# Patient Record
Sex: Female | Born: 1985 | Race: White | Hispanic: No | Marital: Married | Smoking: Never smoker
Health system: Southern US, Community
[De-identification: ages and names within clinical notes are randomized; demographics above are authoritative.]

## PROBLEM LIST (undated history)

## (undated) ENCOUNTER — Inpatient Hospital Stay (HOSPITAL_COMMUNITY): Payer: Self-pay

## (undated) DIAGNOSIS — O021 Missed abortion: Secondary | ICD-10-CM

## (undated) HISTORY — PX: ABDOMINOPLASTY: SUR9

## (undated) HISTORY — PX: OTHER SURGICAL HISTORY: SHX169

---

## 2010-01-30 ENCOUNTER — Encounter: Payer: Self-pay | Admitting: Family Medicine

## 2010-01-30 ENCOUNTER — Ambulatory Visit: Payer: Self-pay | Admitting: Family Medicine

## 2010-01-30 DIAGNOSIS — J309 Allergic rhinitis, unspecified: Secondary | ICD-10-CM | POA: Insufficient documentation

## 2010-01-30 DIAGNOSIS — R824 Acetonuria: Secondary | ICD-10-CM | POA: Insufficient documentation

## 2010-01-30 DIAGNOSIS — O24019 Pre-existing diabetes mellitus, type 1, in pregnancy, unspecified trimester: Secondary | ICD-10-CM | POA: Insufficient documentation

## 2010-01-30 DIAGNOSIS — R7309 Other abnormal glucose: Secondary | ICD-10-CM | POA: Insufficient documentation

## 2010-01-31 ENCOUNTER — Encounter: Payer: Self-pay | Admitting: Family Medicine

## 2010-02-01 ENCOUNTER — Encounter: Payer: Self-pay | Admitting: Family Medicine

## 2010-02-02 ENCOUNTER — Ambulatory Visit: Payer: Self-pay | Admitting: Family Medicine

## 2010-02-04 ENCOUNTER — Encounter (INDEPENDENT_AMBULATORY_CARE_PROVIDER_SITE_OTHER): Payer: Self-pay

## 2010-02-05 LAB — CONVERTED CEMR LAB
AST: 11 units/L (ref 0–37)
Alkaline Phosphatase: 65 units/L (ref 39–117)
BUN: 11 mg/dL (ref 6–23)
Basophils Relative: 1 % (ref 0–1)
Eosinophils Absolute: 0 10*3/uL (ref 0.0–0.7)
Eosinophils Relative: 0 % (ref 0–5)
Glucose, Bld: 451 mg/dL — ABNORMAL HIGH (ref 70–99)
Hemoglobin: 14.1 g/dL (ref 12.0–15.0)
MCHC: 33.7 g/dL (ref 30.0–36.0)
MCV: 85.3 fL (ref 78.0–100.0)
Monocytes Absolute: 0.6 10*3/uL (ref 0.1–1.0)
Monocytes Relative: 7 % (ref 3–12)
RBC: 4.91 M/uL (ref 3.87–5.11)
Sodium: 135 meq/L (ref 135–145)
Total Bilirubin: 0.6 mg/dL (ref 0.3–1.2)
Vit D, 25-Hydroxy: 42 ng/mL (ref 30–89)

## 2010-02-12 ENCOUNTER — Telehealth: Payer: Self-pay | Admitting: Family Medicine

## 2010-02-17 HISTORY — PX: WISDOM TOOTH EXTRACTION: SHX21

## 2010-02-25 ENCOUNTER — Encounter: Payer: Self-pay | Admitting: Family Medicine

## 2010-02-27 ENCOUNTER — Encounter
Admission: RE | Admit: 2010-02-27 | Discharge: 2010-03-19 | Payer: Self-pay | Source: Home / Self Care | Attending: Family Medicine | Admitting: Family Medicine

## 2010-03-21 LAB — CONVERTED CEMR LAB
Bilirubin Urine: NEGATIVE
Blood Glucose, Fingerstick: 408
Blood in Urine, dipstick: NEGATIVE
Glucose, Urine, Semiquant: 500
WBC Urine, dipstick: NEGATIVE
pH: 5

## 2010-03-21 NOTE — Progress Notes (Signed)
  Phone Note Call from Patient   Caller: Patient Reason for Call: Talk to Nurse Summary of Call: Patient called because she needs a copy of recent lab work for an dr. appointment within [facility]  She is also wondering if we received her antibody results.  You can call her at [phone]. Initial call taken by: Dorna Leitz,  February 12, 2010 1:57 PM  Follow-up for Phone Call        Pt. requested a copy of her current lab results. Pt's current lab results were faxex to the pt. at [phone]. / rwt Follow-up by: Levonne Spiller EMT-P,  February 12, 2010 2:15 PM

## 2010-03-21 NOTE — Letter (Signed)
Summary: Health Report Card  Health Report Card   Imported By: Dorna Leitz 01/30/2010 16:45:20  _____________________________________________________________________  External Attachment:    Type:   Image     Comment:   External Document

## 2010-03-21 NOTE — Letter (Signed)
Summary: Handout Printed  Printed Handout:  - Insulin Injections, How & Where to Give, Adult

## 2010-03-21 NOTE — Assessment & Plan Note (Signed)
Summary: POSSIBLE SHE MIGHT HAVE DIABETES/EVM   Vital Signs:  Patient Profile:   25 Years Old Female CC:      DM check. Height:     67.5 inches Weight:      131 pounds BMI:     20.29 O2 Sat:      100 % O2 treatment:    Room Air Temp:     98.0 degrees F oral Pulse rate:   136 / minute Pulse rhythm:   regular Resp:     18 per minute BP sitting:   152 / 97  (right arm)  Pt. in pain?   no  Vitals Entered By: Levonne Spiller EMT-P (January 30, 2010 2:41 PM)              Is Patient Diabetic? No CBG Result 408  Does patient need assistance? Functional Status Self care Ambulation Normal Comments 2nd CBG Results= 398mg /dl post 10 units Regular Insulin, 3rd CBG Results= 401mg /dl, 4th CBG NFAOZHY=865 Post 10 units Regular Insulin. / rwt     Serial Vital Signs/Assessments:  Time      Position  BP       Pulse  Resp  Temp     By 435 P               134/90   90                    Clanford Johnson MD   Current Allergies: No known allergies  Lab Results    Ordered by:  DR. Maryln Manuel    Date tests performed: 01/30/2010    Performed by:  Levonne Spiller EMT-P Urinalysis:      Color:     Yellow    Appear:     Clear    Leuk:     Neg    Nitr:     Neg    Urobil:     0.2    Prot:     Neg    pH:     5.0    Blood:     Neg    Sp. Gr:     1.010    Ket:     80mg /dl    Bili:     Neg    Glu:     500     Hgb A1C:     >14.0   % History of Present Illness History from: patient Reason for visit: see chief complaint Chief Complaint: DM check. History of Present Illness: This patient came in to be evaluated for an elevated blood sugar that was found when she had a health screen done on 11/28.  She didn't get the results back for about 2 weeks and then she learned that her blood sugar was over 300 for the health screen.  She said that she had lost 30 lbs since January 2011.  She says that she has been thirsty and fatigued and having a dry mouth.  She says that she never suspected that she  could have diabetes.  She says that she does have a family history of diabetes.  Her maternal grandmother suffered from Type 1 DM.   Her paternal GF also had DM.  She has been having headaches during the day.  She had an eye exam 2 months ago that was OK.  No retinopathy was noted to the patient.   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   Her urination has increased substantially over the last several days.  She reports that she did have a flu vaccine this season.    REVIEW OF SYSTEMS Constitutional Symptoms       Complains of weight loss.     Denies fever, chills, night sweats, weight gain, and fatigue.  Eyes       Complains of contact lenses.      Denies change in vision, eye pain, eye discharge, glasses, and eye surgery. Ear/Nose/Throat/Mouth       Denies hearing loss/aids, change in hearing, ear pain, ear discharge, dizziness, frequent runny nose, frequent nose bleeds, sinus problems, sore throat, hoarseness, and tooth pain or bleeding.  Respiratory       Denies dry cough, productive cough, wheezing, shortness of breath, asthma, bronchitis, and emphysema/COPD.  Cardiovascular       Denies murmurs, chest pain, and tires easily with exhertion.    Gastrointestinal       Denies stomach pain, nausea/vomiting, diarrhea, constipation, blood in bowel movements, and indigestion. Genitourniary       Denies painful urination, kidney stones, and loss of urinary control. Neurological       Complains of headaches.      Denies paralysis, seizures, and fainting/blackouts. Musculoskeletal       Denies muscle pain, joint pain, joint stiffness, decreased range of motion, redness, swelling, muscle weakness, and gout.  Skin       Denies bruising, unusual mles/lumps or sores, and hair/skin or nail changes.  Psych       Denies mood changes, temper/anger issues, anxiety/stress, speech problems, depression, and sleep problems.  Past  History:  Past Medical History: Allergic rhinitis  Past Surgical History: Denies surgical history  Family History: Type 1 DM - Maternal GM Type 2 DM - Paternal GF Father - Hyperlipidemia Family History Hypertension Sibling - Healthy Sister  Social History: Occupation: Pt works at [employer] Married No Children Never Smoked Alcohol use-no Drug use-no Smoking Status:  never Drug Use:  no Physical Exam General appearance: well developed, thin female, no acute distress Head: normocephalic, atraumatic Eyes: conjunctivae and lids normal Pupils: equal, round, reactive to light Ears: normal, no lesions or deformities Nasal: mucosa pink, nonedematous, no septal deviation, turbinates normal Oral/Pharynx: dry mucus membranes, tongue normal, posterior pharynx without erythema or exudate Neck: neck supple,  trachea midline, no masses Thyroid: no nodules, masses, tenderness, or enlargement Chest/Lungs: no rales, wheezes, or rhonchi bilateral, breath sounds equal without effort Heart: regular rate and  rhythm, no murmur Abdomen: soft, non-tender without obvious organomegaly Extremities: normal extremities Neurological: grossly intact and non-focal Skin: no obvious rashes or lesions MSE: oriented to time, place, and person Assessment Problems:   New Problems: DIAB W/O COMP TYPE I [JUV] NOT STATED UNCNTRL (ICD-250.01) HYPERGLYCEMIA (ICD-790.29) KETONURIA (ICD-791.6) ALLERGIC RHINITIS (ICD-477.9)   Patient Education: Patient and/or caregiver instructed in the following: rest, fluids. The risks, benefits and possible side effects were clearly explained and discussed with the patient.  The patient verbalized clear understanding.  The patient was given instructions to return if symptoms don't improve, worsen or new changes develop.  If it is not during clinic hours and the patient cannot get back to this clinic then the patient was told to seek medical care at an  available urgent care or emergency department.  The patient verbalized understanding.   Demonstrates willingness to comply.  Plan New Medications/Changes: KETOSTIX  STRP (ACETONE (URINE) TEST) use as directed to test  urine for ketones when sick or blood sugar greater than 250.  #100 x 3, 01/30/2010, Clanford Johnson MD [facility]UCAGON EMERGENCY 1 MG KIT (GLUCAGON (RDNA)) use as directed for severe hypoglycemia  #1 x prn, 01/30/2010, Clanford Johnson MD KLOR-CON M20 20 MEQ CR-TABS (POTASSIUM CHLORIDE CRYS CR) take 1 tab by mouth two times a day until completed  #20 x 0, 01/30/2010, Clanford Johnson MD [facility] DELICA LANCETS  MISC (LANCETS) use as directed  #150 x 3, 01/30/2010, Clanford Johnson MD ONETOUCH ULTRA BLUE  STRP (GLUCOSE BLOOD) use to test blood glucose 4 times per day and as needed - Pt is on INSULIN  #150 x 3, 01/30/2010, Clanford Johnson MD RELION INSULIN SYRINGE 31G X 5/16" 0.3 ML MISC (INSULIN SYRINGE-NEEDLE U-100) use as directed for insulin injection  #100 x 3, 01/30/2010, Clanford Johnson MD NOVOLIN R 100 UNIT/ML SOLN (INSULIN REGULAR HUMAN) use as directed  #1 vial x 3, 01/30/2010, Clanford Johnson MD NOVOLIN N 100 UNIT/ML SUSP (INSULIN ISOPHANE HUMAN) use as directed  #1 vial x 3, 01/30/2010, Clanford Johnson MD  New Orders: Fingerstick [[id number]] Fingerstick [[id number]] Planning Comments:   Ordered additional labs including:  CBC, CMP, TSH, Mg, Phos, GAD-65, C-peptide   Follow Up: Follow up in 1-2 days if no improvement Follow Up: tomorrow by phone and OV in 2 days  The patient and/or caregiver has been counseled thoroughly with regard to medications prescribed including dosage, schedule, interactions, rationale for use, and possible side effects and they verbalize understanding.  Diagnoses and expected course of recovery discussed and will return if not improved as expected or if the condition worsens. Patient and/or caregiver verbalized understanding.  Prescriptions: KETOSTIX   STRP (ACETONE (URINE) TEST) use as directed to test urine for ketones when sick or blood sugar greater than 250.  #100 x 3   Entered and Authorized by:   Standley Dakins MD   Signed by:   Standley Dakins MD on 01/30/2010   Method used:   Electronically to        [facility] [address] (retail)       [address]       [place], [place]  [address]       Ph: [phone]       Fax: [phone]   RxID:   [id number] GLUCAGON EMERGENCY 1 MG KIT (GLUCAGON (RDNA)) use as directed for severe hypoglycemia  #1 x prn   Entered and Authorized by:   Standley Dakins MD   Signed by:   Standley Dakins MD on 01/30/2010   Method used:   Electronically to        [facility] [address] (retail)       [address], [address]       [place], [place]  [address]       Ph: [phone]       Fax: [phone]   RxID:   [id number] KLOR-CON M20 20 MEQ CR-TABS (POTASSIUM CHLORIDE CRYS CR) take 1 tab by mouth two times a day until completed  #20 x 0   Entered and Authorized by:   Standley Dakins MD   Signed by:   Standley Dakins MD on 01/30/2010   Method used:   Electronically to        [facility] [address] (retail)       [address], [address]  [place], [place]  [address]       Ph: [phone]       Fax: [phone]   RxID:   [id number] Dola Argyle LANCETS  MISC (LANCETS) use as directed  #150 x 3   Entered and Authorized by:   Standley Dakins MD   Signed by:   Standley Dakins MD on 01/30/2010   Method used:   Electronically to        [facility] [address] (retail)       [address], [address]       [place], [place]  [address]       Ph: [phone]       Fax: [phone]   RxID:   [id number] ONETOUCH ULTRA BLUE  STRP (GLUCOSE BLOOD) use to test blood glucose 4 times per day and as needed - Pt is on INSULIN  #150 x 3   Entered and  Authorized by:   Standley Dakins MD   Signed by:   Standley Dakins MD on 01/30/2010   Method used:   Electronically to        [facility] [address] (retail)       [address], [address]       [place], [place]  [address]       Ph: [phone]       Fax: [phone]   RxID:   [id number] RELION INSULIN SYRINGE 31G X 5/16" 0.3 ML MISC (INSULIN SYRINGE-NEEDLE U-100) use as directed for insulin injection  #100 x 3   Entered and Authorized by:   Standley Dakins MD   Signed by:   Standley Dakins MD on 01/30/2010   Method used:   Electronically to        [facility] [address] (retail)       [address], [address]       [place], [place]  [address]       Ph: [phone]       Fax: [phone]   RxID:   [id number] NOVOLIN R 100 UNIT/ML SOLN (INSULIN REGULAR HUMAN) use as directed  #1 vial x 3   Entered and Authorized by:   Standley Dakins MD   Signed by:   Standley Dakins MD on 01/30/2010   Method used:   Electronically to        [facility] [address] (retail)       [address], [address]       [place], [place]  [address]       Ph: [phone]       Fax: [phone]   RxID:   [id number] NOVOLIN N 100 UNIT/ML SUSP (INSULIN ISOPHANE HUMAN) use as directed  #1 vial x 3   Entered and Authorized by:   Standley Dakins MD   Signed by:   Standley Dakins MD on 01/30/2010   Method used:   Electronically to        [facility] [address] (retail)       [address], [address]       [place], [place]  [address]       Ph: [phone]       Fax: [phone]  RxID:   [id number]   Patient Instructions: 1)  Please check your blood sugar 4 times per day.  Fasting, before breakfast, lunch and supper. 2)  Take 5 units of Novolin R (clear) about 30 minutes before breakfast, lunch and supper.  3)  Take 12 units of Novolin N (cloudy) before bed, no  later than 10 PM. 4)  Take your potassium tablets and drink plenty of clear fluids. 5)  Your blood sugars are going to be high for awhile.   6)  If you feel shaky or sweaty or confused, check your blood sugar immediately.  If your blood sugar is 60 or below, drink some juice or eat something sweet 15 grams of carbohydrate to bring up your blood sugar.   7)  The patient was informed that there is no on-call provider or services available at this clinic during off-hours (when the clinic is closed).  If the patient developed a problem or concern that required immediate attention, the patient was advised to go the the nearest available urgent care or emergency department for medical care.  The patient verbalized understanding.    8)  Check your blood sugars regularly. If your readings are usually above 300: or below 70 you should contact our office. 9)  It is important that your Diabetic A1c level is checked every 3 months. 10)  See your eye doctor yearly to check for diabetic eye damage. 11)  Check your feet each night for sore areas, calluses or signs of infection. 12)  Please call us in the morning with your blood sugars.  Please come back with your husband in 2 days.  Write your blood sugars down on the log and bring them with you when you come to the clinic.  13)  We will probably be adjusting your doses of insulin over the next several weeks to get your diabetes under better control.   Medication Administration  Injection # 1:    Medication: Insulin 10 units    Diagnosis: HYPERGLYCEMIA (ICD-790.29)    Route: SQ    Site: abdomen    Exp Date: 02/17/2012    Lot #: [id number]    Mfr: LILLY    Patient tolerated injection without complications    Given by: Levonne Spiller EMT-P (January 30, 2010 4:01 PM)  Injection # 2:    Medication: Insulin 10 units    Diagnosis: HYPERGLYCEMIA (ICD-790.29)    Route: SQ    Site: Abdomen    Exp Date: 02/17/2012    Lot #: [id number]    Patient tolerated  injection without complications    Given by: Levonne Spiller EMT-P (January 30, 2010 4:36 PM)  Orders Added: 1)  Fingerstick [[id number]] 2)  Fingerstick [[id number]]  Lab Results    Ordered by:  DR. Maryln Manuel    Date tests performed: 01/30/2010    Performed by:  Levonne Spiller EMT-P Urinalysis:      Color:     Yellow    Appear:     Clear    Leuk:     Neg    Nitr:     Neg    Urobil:     0.2    Prot:     Neg    pH:     5.0    Blood:     Neg    Sp. Gr:     1.010    Ket:     80mg /dl    Bili:     Neg  Glu:     500     Hgb A1C:     >14.0   %    I spent more than 2 hours with the patient today giving her critical diabetes education and instructions regarding my thoughts about latent onset type 1 DM.  We instructed her on insulin administration, glucose monitoring, recognizing and treating hypoglycemia, we started discussing sick day rules, I gave the patient written information to read about diabetes and the care of diabetes.  I arranged for the patient to be seen by the diabetes educator at [facility] to begin intensive diabetes education and to see the dietician for further education.  I asked her to call us tomorrow morning with her blood sugars and to check the blood glucose at least 4 times per day.  I started her on a basal bolus insulin program today.  I wanted to use insulin analogs but the patient says that she cannot afford to take the insulin analogs. Therefore, we started her therapy with Novolin R and Novolin N.  I discussed the dangers and limitations of these insulins with the patient today.  The patient verbalized clear understanding.  I gave the patient a new blood glucose meter and testing strips.  The patient was able to demonstrate testing blood glucose and giving insulin today before going home.  I encouraged the patient to be sure to drink plenty of clear sugar fluids today.   I asked the patient to avoid drinking highly concentrated sugary drinks except to treat a low blood  glucose.  The patient verbalized clear understanding.

## 2010-03-21 NOTE — Letter (Signed)
Summary: Phsician Order for Nutrition and Diabetes   Phsician Order for Nutrition and Diabetes   Imported By: Dorna Leitz 01/31/2010 14:36:54  _____________________________________________________________________  External Attachment:    Type:   Image     Comment:   External Document

## 2010-03-21 NOTE — Letter (Signed)
Summary: Physician Order for Nutrition and Diabetes fax  Physician Order for Nutrition and Diabetes fax   Imported By: Dorna Leitz 01/31/2010 14:41:03  _____________________________________________________________________  External Attachment:    Type:   Image     Comment:   External Document

## 2010-03-21 NOTE — Miscellaneous (Signed)
Summary: Endocrinologist Appt.  Clinical Lists Changes  Pt. was contacted and notified of her Endocrinologist appt. with Dr. Tedd Sias at [facility]. Appt time is 2:30 on 02-05-2010.   Appended Document: Endocrinologist Appt. Pt's office notes and labs were faxed to Dr. Pricilla Handler office. [facility]

## 2010-03-21 NOTE — Letter (Signed)
Summary: BLOOD SUGAR LOG  BLOOD SUGAR LOG   Imported By: Rosine Beat 02/02/2010 17:27:59  _____________________________________________________________________  External Attachment:    Type:   Image     Comment:   External Document

## 2010-03-21 NOTE — Letter (Signed)
Summary: Handout Printed  Printed Handout:  - Blood Sugar, Blood Glucose

## 2010-03-21 NOTE — Letter (Signed)
Summary: Handout Printed  Printed Handout:  - Diabetes Mellitus 

## 2010-03-21 NOTE — Letter (Signed)
Summary: Revised Health Report Card  Revised Health Report Card   Imported By: Dorna Leitz 01/31/2010 16:15:21  _____________________________________________________________________  External Attachment:    Type:   Image     Comment:   External Document

## 2010-03-21 NOTE — Letter (Signed)
Summary: Handout Printed  Printed Handout:  - Diabetes, FAQ's

## 2010-03-21 NOTE — Letter (Signed)
Summary: Blood Sugar log  Blood Sugar log   Imported By: Dorna Leitz 01/31/2010 13:04:17  _____________________________________________________________________  External Attachment:    Type:   Image     Comment:   External Document

## 2010-03-21 NOTE — Letter (Signed)
Summary: Blood Sugar Log  Blood Sugar Log   Imported By: Dorna Leitz 02/01/2010 15:54:07  _____________________________________________________________________  External Attachment:    Type:   Image     Comment:   External Document

## 2010-03-21 NOTE — Letter (Signed)
Summary: Handout Printed  Printed Handout:  - Diabetes - Seven Principles of Caring for Your Diabetes for Life 

## 2010-03-21 NOTE — Assessment & Plan Note (Signed)
Summary: follow up visit from Weds/jbb   Vital Signs:  Patient profile:   25 year old female Height:      67.5 inches Weight:      132 pounds BMI:     20.44 Temp:     98 degrees F oral Pulse rate:   80 / minute Pulse rhythm:   regular Resp:     18 per minute BP sitting:   130 / 80  (left arm) Cuff size:   regular  Vitals Entered By: Providence Crosby LPN (February 02, 2010 11:07 AM)  Visit Type:  Followup diabetes   History of Present Illness: Followup diabetes - Pt is here to follow up for the recent diagnosis of T1 DM.  We continued intensive diabetes education in the office today.  The patient is doing very well.  She is having no hypoglycemia and her blood glucose readings have been reviewed.  Her numbers have been 120-180.  She had one BG that was 220 after a party where she had some potato salad and cole slaw.  We discussed some dietary things today and they brought in the glucagon emergency kit.  I demonstrated the use of it to the patient's husband who came in today.  I tried to answer all of their questions and once again we discussed hypoglycemia and treatment of it and recognition of it in the office today.  I requested that the patient get a medic Alert bracelet and wear it.   Also, I discussed that the patient should have a fast acting carb on the dashboard of the car at all times when driving.  She said that she would make sure to go ahead and get one.     Allergies: No Known Drug Allergies  Past History:  Past Medical History: Last updated: 01/30/2010 Allergic rhinitis  Past Surgical History: Last updated: 01/30/2010 Denies surgical history  Family History: Last updated: 01/30/2010 Type 1 DM - Maternal GM Type 2 DM - Paternal GF Father - Hyperlipidemia Family History Hypertension Sibling - Healthy Sister  Social History: Last updated: 01/30/2010 Occupation: Pt works at [employer] Married No Children Never Smoked Alcohol use-no Drug  use-no  Risk Factors: Smoking Status: never (01/30/2010)  Family History: Reviewed history from 01/30/2010 and no changes required. Type 1 DM - Maternal GM Type 2 DM - Paternal GF Father - Hyperlipidemia Family History Hypertension Sibling - Healthy Sister  Social History: Reviewed history from 01/30/2010 and no changes required. Occupation: Pt works at [employer] Married No Children Never Smoked Alcohol use-no Drug use-no  Review of Systems General:  Denies chills, fatigue, fever, loss of appetite, malaise, sleep disorder, sweats, weakness, and weight loss. Eyes:  Denies blurring, discharge, double vision, eye irritation, eye pain, halos, itching, light sensitivity, red eye, vision loss-1 eye, and vision loss-both eyes. ENT:  Denies decreased hearing, difficulty swallowing, ear discharge, earache, hoarseness, nasal congestion, nosebleeds, postnasal drainage, ringing in ears, sinus pressure, and sore throat. CV:  Denies bluish discoloration of lips or nails, chest pain or discomfort, difficulty breathing at night, difficulty breathing while lying down, fainting, fatigue, leg cramps with exertion, lightheadness, near fainting, palpitations, shortness of breath with exertion, swelling of feet, swelling of hands, and weight gain. Resp:  Denies chest discomfort, chest pain with inspiration, cough, coughing up blood, excessive snoring, hypersomnolence, morning headaches, pleuritic, shortness of breath, sputum productive, and wheezing. GI:  Denies abdominal pain, bloody stools, change in bowel habits, constipation, dark tarry stools, diarrhea, excessive appetite,  gas, hemorrhoids, indigestion, loss of appetite, nausea, vomiting, vomiting blood, and yellowish skin color. MS:  Denies joint pain, joint redness, joint swelling, loss of strength, low back pain, mid back pain, muscle aches, muscle , cramps, muscle weakness, stiffness, and thoracic pain. Neuro:  Denies brief  paralysis, difficulty with concentration, disturbances in coordination, falling down, headaches, inability to speak, memory loss, numbness, poor balance, seizures, sensation of room spinning, tingling, tremors, visual disturbances, and weakness. Psych:  Denies alternate hallucination ( auditory/visual), anxiety, depression, easily angered, easily tearful, irritability, mental problems, panic attacks, sense of great danger, suicidal thoughts/plans, thoughts of violence, unusual visions or sounds, and thoughts /plans of harming others.  Physical Exam  General:  well developed, thin female, no acute distress Head:  Normocephalic and atraumatic without obvious abnormalities. No apparent alopecia or balding. Eyes:  No corneal or conjunctival inflammation noted. EOMI. Perrla. Funduscopic exam benign, without hemorrhages, exudates or papilledema. Vision grossly normal. Ears:  External ear exam shows no significant lesions or deformities.  Otoscopic examination reveals clear canals, tympanic membranes are intact bilaterally without bulging, retraction, inflammation or discharge. Hearing is grossly normal bilaterally. Nose:  External nasal examination shows no deformity or inflammation. Nasal mucosa are pink and moist without lesions or exudates. Mouth:  Oral mucosa and oropharynx without lesions or exudates.  Teeth in good repair. Neck:  No deformities, masses, or tenderness noted. Lungs:  Normal respiratory effort, chest expands symmetrically. Lungs are clear to auscultation, no crackles or wheezes. Heart:  Normal rate and regular rhythm. S1 and S2 normal without gallop, murmur, click, rub or other extra sounds. Abdomen:  Bowel sounds positive,abdomen soft and non-tender without masses, organomegaly or hernias noted. Msk:  No deformity or scoliosis noted of thoracic or lumbar spine.   Extremities:  No clubbing, cyanosis, edema, or deformity noted with normal full range of motion of all joints.     Neurologic:  No cranial nerve deficits noted. Station and gait are normal. Plantar reflexes are down-going bilaterally. DTRs are symmetrical throughout. Sensory, motor and coordinative functions appear intact. Skin:  Intact without suspicious lesions or rashes Axillary Nodes:  No palpable lymphadenopathy   Impression & Recommendations:  Problem # 1:  DIAB W/O COMP TYPE I [JUV] NOT STATED UNCNTRL (ICD-250.01)  Her updated medication list for this problem includes:    Novolin N 100 Unit/ml Susp (Insulin isophane human) ..... Use as directed    Novolin R 100 Unit/ml Soln (Insulin regular human) ..... Use as directed    Glucagon Emergency 1 Mg Kit (Glucagon (rdna)) ..... Use as directed for severe hypoglycemia  We did more intensive diabetes education in the office today and will refer patient to an endocrinologist as she has requested.    Hypoglycemia precautions discussed in detail today and close monitoring of blood glucose discussed.  Honeymoon Phenomenon discussed today as well.  The patient verbalized clear understanding.    Problem # 2:  HYPERGLYCEMIA (ICD-790.29) Assessment: Improved  Her updated medication list for this problem includes:    Novolin N 100 Unit/ml Susp (Insulin isophane human) ..... Use as directed    Novolin R 100 Unit/ml Soln (Insulin regular human) ..... Use as directed    Glucagon Emergency 1 Mg Kit (Glucagon (rdna)) ..... Use as directed for severe hypoglycemia  Complete Medication List: 1)  Loratadine 10 Mg Tabs (Loratadine) .Marland Kitchen.. 1 by mouth daily 2)  Novolin N 100 Unit/ml Susp (Insulin isophane human) .... Use as directed 3)  Novolin R 100 Unit/ml Soln (Insulin regular human) .Marland KitchenMarland KitchenMarland Kitchen  Use as directed 4)  Relion Insulin Syringe 31g X 5/16" 0.3 Ml Misc (Insulin syringe-needle u-100) .... Use as directed for insulin injection 5)  Onetouch Ultra Blue Strp (Glucose blood) .... Use to test blood glucose 4 times per day and as needed - pt is on insulin 6)  Onetouch  Delica Lancets Misc (Lancets) .... Use as directed 7)  Klor-con M20 20 Meq Cr-tabs (Potassium chloride crys cr) .... Take 1 tab by mouth two times a day until completed 8)  Glucagon Emergency 1 Mg Kit (Glucagon (rdna)) .... Use as directed for severe hypoglycemia 9)  Ketostix Strp (Acetone (urine) test) .... Use as directed to test urine for ketones when sick or blood sugar greater than 250.  Patient Instructions: 1)  Check your blood sugars regularly at least 4 times per day. If your readings are usually above 200: or below 70 you should contact our office. 2)  It is important that your Diabetic A1c level is checked every 3 months. 3)  See your eye doctor yearly to check for diabetic eye damage. 4)  Check your feet each night for sore areas, calluses or signs of infection. 5)  We will be making you an appointment to see an endocrinologist.  Please keep that appointment.   6)  The patient was informed that there is no on-call Imberly Troxler or services available at this clinic during off-hours (when the clinic is closed).  If the patient developed a problem or concern that required immediate attention, the patient was advised to go the the nearest available urgent care or emergency department for medical care.  The patient verbalized understanding.    7)  Get a Arboriculturist and make sure you go to your appointment with the diabetes educator and dietician.

## 2010-03-21 NOTE — Letter (Signed)
Summary: Handout Printed  Printed Handout:  - Hypoglycemia (Low Blood Sugar)

## 2010-03-21 NOTE — Progress Notes (Signed)
Summary: Medical records  Medical records   Imported By: Dorna Leitz 02/25/2010 17:11:34  _____________________________________________________________________  External Attachment:    Type:   Image     Comment:   External Document

## 2010-03-21 NOTE — Letter (Signed)
Summary: Health Report Card  Health Report Card   Imported By: Dorna Leitz 01/31/2010 16:08:08  _____________________________________________________________________  External Attachment:    Type:   Image     Comment:   External Document

## 2011-04-26 ENCOUNTER — Emergency Department (HOSPITAL_COMMUNITY)
Admission: EM | Admit: 2011-04-26 | Discharge: 2011-04-26 | Disposition: A | Discharge status: Home / Self Care | Payer: 59 | Source: Home / Self Care

## 2011-04-26 ENCOUNTER — Encounter (HOSPITAL_COMMUNITY): Payer: Self-pay | Admitting: Emergency Medicine

## 2011-04-26 DIAGNOSIS — R198 Other specified symptoms and signs involving the digestive system and abdomen: Secondary | ICD-10-CM

## 2011-04-26 LAB — COMPREHENSIVE METABOLIC PANEL
ALT: 15 U/L (ref 0–35)
AST: 16 U/L (ref 0–37)
CO2: 24 mEq/L (ref 19–32)
Calcium: 9.4 mg/dL (ref 8.4–10.5)
Chloride: 102 mEq/L (ref 96–112)
GFR calc non Af Amer: >90 mL/min (ref >90)
Potassium: 3.7 mEq/L (ref 3.5–5.1)
Sodium: 135 mEq/L (ref 135–145)

## 2011-04-26 NOTE — ED Provider Notes (Signed)
History     CSN: [id number]  Arrival date & time 04/26/11  1446   None     Chief Complaint  Patient presents with  . Diarrhea    (Consider location/radiation/quality/duration/timing/severity/associated sxs/prior treatment) HPI Comments: This very nice 26 year old female presents today concerned with the change in her bowel movements. She states that she had a bowel movement yesterday and noticed that it was white in color. She had another bowel movement at approximately 10:00 this morning which she states was soft like pudding and again was whitish in color. She has otherwise been feeling well. She denies abdominal pain, nausea, vomiting, fever or chills. She was diagnosed with type 1 diabetes 2 years ago and has an insulin pump. She had routine lab work done one month ago. Has had no change in diet.   Past Medical History  Diagnosis Date  . Diabetes mellitus     History reviewed. No pertinent past surgical history.  History reviewed. No pertinent family history.  History  Substance Use Topics  . Smoking status: Never Smoker   . Smokeless tobacco: Not on file  . Alcohol Use: No    OB History    Grav Para Term Preterm Abortions TAB SAB Ect Mult Living                  Review of Systems  Constitutional: Negative for fever, chills, appetite change and unexpected weight change.  Respiratory: Negative for cough and shortness of breath.   Cardiovascular: Negative for chest pain.  Gastrointestinal: Negative for nausea, vomiting and abdominal pain.  Skin: Negative for color change.    Allergies  Review of patient's allergies indicates no known allergies.  Home Medications   Current Outpatient Rx  Name Route Sig Dispense Refill  . INSULIN LISPRO (HUMAN) 100 UNIT/ML [place] SOLN Subcutaneous Inject into the skin 3 (three) times daily before meals. humalog insulin pump    . LORATADINE 10 MG PO TABS Oral Take 10 mg by mouth daily.      BP 149/86  Pulse 119  Temp(Src) 99.9  F (37.7 C) (Oral)  Resp 20  SpO2 99%  LMP 04/26/2011  Physical Exam  Constitutional: She appears well-developed and well-nourished. No distress.  HENT:  Head: Normocephalic and atraumatic.  Right Ear: Tympanic membrane, external ear and ear canal normal.  Left Ear: Tympanic membrane, external ear and ear canal normal.  Nose: Nose normal.  Mouth/Throat: Uvula is midline, oropharynx is clear and moist and mucous membranes are normal. No oropharyngeal exudate, posterior oropharyngeal edema or posterior oropharyngeal erythema.  Eyes: Conjunctivae are normal.       Sclera white, no icterus.   Neck: Neck supple.  Cardiovascular: Normal rate, regular rhythm and normal heart sounds.   Pulmonary/Chest: Effort normal and breath sounds normal. No respiratory distress.  Abdominal: Normal appearance and bowel sounds are normal. She exhibits no mass. There is no hepatosplenomegaly. There is no tenderness. There is no CVA tenderness.  Lymphadenopathy:    She has no cervical adenopathy.  Neurological: She is alert.  Skin: Skin is warm and dry.  Psychiatric: She has a normal mood and affect.    ED Course  Procedures (including critical care time)  Labs Reviewed  COMPREHENSIVE METABOLIC PANEL - Abnormal; Notable for the following:    Glucose, Bld 155 (*)    Alkaline Phosphatase 38 (*)    All other components within normal limits   No results found.   1. Bowel movement symptom  MDM   Comp panel neg. To f/u with PCP next week if symptoms persist. To go to ED if develops abd pain, N/V or fever.        Melody Comas, [place] 04/26/11 1901

## 2011-04-26 NOTE — ED Notes (Signed)
Noticed a white stool yesterday and again today, soft stool.  No increase in the number of stools. No vomiting.  Denies labs work for one month, no radiology tests performed.  Patient denies feeling bad.  "feels fine"

## 2011-04-26 NOTE — Discharge Instructions (Signed)
Your blood work is normal. Without abdominal pain, nausea or vomiting the most likely cause for the change in the color of your stools is from something that you have eaten. If your stools continue to be light colored though, your primary care dr will need to further investigate. If you develop abdominal pain, nausea, vomiting or fever go immediately to the ER.

## 2011-04-27 NOTE — ED Provider Notes (Signed)
Medical screening examination/treatment/procedure(s) were performed by non-physician practitioner and as supervising physician I was immediately available for consultation/collaboration.  Leslee Home, M.D.   Reuben Likes, MD 04/27/11 [phone]

## 2011-06-14 ENCOUNTER — Emergency Department (INDEPENDENT_AMBULATORY_CARE_PROVIDER_SITE_OTHER): Payer: 59

## 2011-06-14 ENCOUNTER — Emergency Department (HOSPITAL_COMMUNITY)
Admission: EM | Admit: 2011-06-14 | Discharge: 2011-06-14 | Disposition: A | Discharge status: Home / Self Care | Payer: 59 | Source: Home / Self Care | Attending: Family Medicine | Admitting: Family Medicine

## 2011-06-14 ENCOUNTER — Encounter (HOSPITAL_COMMUNITY): Payer: Self-pay

## 2011-06-14 DIAGNOSIS — S93402A Sprain of unspecified ligament of left ankle, initial encounter: Secondary | ICD-10-CM

## 2011-06-14 DIAGNOSIS — S93409A Sprain of unspecified ligament of unspecified ankle, initial encounter: Secondary | ICD-10-CM

## 2011-06-14 MED ORDER — IBUPROFEN 600 MG PO TABS: 600.0000 mg | ORAL_TABLET | Freq: Three times a day (TID) | ORAL | Status: AC | PRN

## 2011-06-14 MED ORDER — TRAMADOL HCL 50 MG PO TABS: 50.0000 mg | ORAL_TABLET | Freq: Four times a day (QID) | ORAL | Status: AC | PRN

## 2011-06-14 NOTE — ED Notes (Signed)
Pt has lt foot pain and bruising that started on Wednesday after exercising and has progressively worsened.

## 2011-06-14 NOTE — Discharge Instructions (Signed)
There is no signs of bone injury or fractures in your x-rays. My impression is that you have sprained a lateral ligamentous of your left mid foot. Take the prescribed medications as instructed, be aware that tramadol can make you drowsy and he should not drive after taking this medication. Start doing ankle rehabilitation exercises as soon as pain improves. Followup with orthopedic if worsening symptoms despite following treatment.

## 2011-06-15 NOTE — ED Provider Notes (Signed)
History     CSN: [id number]  Arrival date & time 06/14/11  4098   First MD Initiated Contact with Patient 06/14/11 [phone]      Chief Complaint  Patient presents with  . Foot Pain    (Consider location/radiation/quality/duration/timing/severity/associated sxs/prior treatment) HPI Comments: 26 y/o female h/o diabetes. Here c/o left foot pain and swelling for 3 days. Symptoms started after working out in a treadmill. Denies falls or direct injury. Weight bearing with minimal discomfort but pain with certain movements. Impress symptoms are getting worse.has taken alive and has used ICE.   Past Medical History  Diagnosis Date  . Diabetes mellitus     History reviewed. No pertinent past surgical history.  History reviewed. No pertinent family history.  History  Substance Use Topics  . Smoking status: Never Smoker   . Smokeless tobacco: Not on file  . Alcohol Use: No    OB History    Grav Para Term Preterm Abortions TAB SAB Ect Mult Living                  Review of Systems  All other systems reviewed and are negative.    Allergies  Review of patient's allergies indicates no known allergies.  Home Medications   Current Outpatient Rx  Name Route Sig Dispense Refill  . IBUPROFEN 600 MG PO TABS Oral Take 1 tablet (600 mg total) by mouth every 8 (eight) hours as needed for pain. 20 tablet 0  . INSULIN LISPRO (HUMAN) 100 UNIT/ML [place] SOLN Subcutaneous Inject into the skin 3 (three) times daily before meals. humalog insulin pump    . LORATADINE 10 MG PO TABS Oral Take 10 mg by mouth daily.    . TRAMADOL HCL 50 MG PO TABS Oral Take 1 tablet (50 mg total) by mouth every 6 (six) hours as needed for pain. 15 tablet 0    BP 130/83  Pulse 106  Temp(Src) 98.5 F (36.9 C) (Oral)  Resp 19  SpO2 98%  LMP 05/28/2011  Physical Exam  Nursing note and vitals reviewed. Constitutional: She is oriented to person, place, and time. She appears well-developed and well-nourished. No  distress.  HENT:  Head: Normocephalic and atraumatic.  Cardiovascular: Normal rate, regular rhythm and intact distal pulses.   Pulmonary/Chest: No respiratory distress.  Musculoskeletal:       Left foot: mild swelling and bruising with diffused tenderness over dorsolateral area of mid foot.  Patient able to dorsiflex and extend with no difficulty but reported pain with foot lateralization. No ankle laxity. Able to bear weight with minimal discomfort.  No peri malleolar pain to palpation swelling or bruising.  Normal dorsopedial and tibial posterior pulses.  No skin abrasions or lacerations.   Neurological: She is alert and oriented to person, place, and time.  Skin: No rash noted.    ED Course  Procedures (including critical care time)  Labs Reviewed - No data to display Dg Foot Complete Left  06/14/2011  *RADIOLOGY REPORT*  Clinical Data: Pain and bruising of the left lateral foot.  LEFT FOOT - COMPLETE 3+ VIEW  Comparison:  None.  Findings:  There is no evidence of fracture or dislocation.  There is no evidence of arthropathy or other focal bone abnormality. Soft tissues are unremarkable.  IMPRESSION: Negative.  Original Report Authenticated By: Reola Calkins, M.D.     1. Sprain of ligament of left ankle       MDM  Impress lateral mid foot ligaments sprain. Placed  on ankle brace and rigid shoe for some immobilization. Prescribed tramadol, ibuprofen. Rehab exercises. Follow up with ortho as needed.        Sharin Grave, MD 06/15/11 [phone]

## 2011-08-15 ENCOUNTER — Ambulatory Visit: Payer: Self-pay | Admitting: Family Medicine

## 2011-08-15 ENCOUNTER — Ambulatory Visit (INDEPENDENT_AMBULATORY_CARE_PROVIDER_SITE_OTHER): Payer: 59 | Admitting: Family Medicine

## 2011-08-15 ENCOUNTER — Encounter: Payer: Self-pay | Admitting: Family Medicine

## 2011-08-15 VITALS — BP 118/80 | HR 84 | Temp 98.2°F | Ht 68.0 in | Wt 137.0 lb

## 2011-08-15 DIAGNOSIS — E109 Type 1 diabetes mellitus without complications: Secondary | ICD-10-CM

## 2011-08-15 DIAGNOSIS — Z319 Encounter for procreative management, unspecified: Secondary | ICD-10-CM

## 2011-08-15 NOTE — Patient Instructions (Addendum)
It was so nice to meet you. Please stop by to see Karen Alexander on your way out to set up your GYN appointment.

## 2011-08-15 NOTE — Progress Notes (Signed)
Subjective:    Patient ID: Karen Alexander, female    DOB: 07/12/1985, 26 y.o.   MRN: [id number]  HPI  26 yo very pleasant female here to establish care.  Type 1 DM- diagnosed in 01/2010. Has insulin pump.  Last a1c in May was 6.2. Has been really well. Only very occasional episodes of hypoglycemia-none recently.  No family h/o DM1 or other autoimmune disorders other than thyroid disease in her grandmother.  She and her husband have been trying to get pregnant since 05/2009 without success.  Her periods are regular, not heavy. Has used ovulation kits- she is ovulating every month according to those test strips.  Patient Active Problem List  Diagnosis  . DIAB W/O COMP TYPE I [JUV] NOT STATED UNCNTRL  . ALLERGIC RHINITIS  . HYPERGLYCEMIA  . KETONURIA  . Patient desires pregnancy   Past Medical History  Diagnosis Date  . Diabetes mellitus    No past surgical history on file. History  Substance Use Topics  . Smoking status: Never Smoker   . Smokeless tobacco: Not on file  . Alcohol Use: No   No family history on file. No Known Allergies Current Outpatient Prescriptions on File Prior to Visit  Medication Sig Dispense Refill  . insulin lispro (HUMALOG) 100 UNIT/ML injection Inject into the skin 3 (three) times daily before meals. humalog insulin pump      . loratadine (CLARITIN) 10 MG tablet Take 10 mg by mouth daily.       The PMH, PSH, Social History, Family History, Medications, and allergies have been reviewed in [facility], and have been updated if relevant.   Review of Systems    See HPI Patient reports no  vision/ hearing changes,anorexia, weight change, fever ,adenopathy, persistant / recurrent hoarseness, swallowing issues, chest pain, edema,persistant / recurrent cough, hemoptysis, dyspnea(rest, exertional, paroxysmal nocturnal), gastrointestinal  bleeding (melena, rectal bleeding), abdominal pain, excessive heart burn, GU symptoms(dysuria, hematuria, pyuria,  voiding/incontinence  Issues) syncope, focal weakness, severe memory loss, concerning skin lesions, depression, anxiety, abnormal bruising/bleeding, major joint swelling, breast masses or abnormal vaginal bleeding.    Objective:   Physical Exam BP 118/80  Pulse 84  Temp 98.2 F (36.8 C)  Ht 5\' 8"  (1.727 m)  Wt 137 lb (62.143 kg)  BMI 20.83 kg/m2  General:  Well-developed,well-nourished,in no acute distress; alert,appropriate and cooperative throughout examination Head:  normocephalic and atraumatic.   Eyes:  vision grossly intact, pupils equal, pupils round, and pupils reactive to light.   Ears:  R ear normal and L ear normal.   Nose:  no external deformity.   Mouth:  good dentition.   Lungs:  Normal respiratory effort, chest expands symmetrically. Lungs are clear to auscultation, no crackles or wheezes. Heart:  Normal rate and regular rhythm. S1 and S2 normal without gallop, murmur, click, rub or other extra sounds. Abdomen:  Bowel sounds positive,abdomen soft and non-tender without masses, organomegaly or hernias noted. Pump in place Msk:  No deformity or scoliosis noted of thoracic or lumbar spine.   Extremities:  No clubbing, cyanosis, edema, or deformity noted with normal full range of motion of all joints.   Neurologic:  alert & oriented X3 and gait normal.   Skin:  Intact without suspicious lesions or rashes Psych:  Cognition and judgment appear intact. Alert and cooperative with normal attention span and concentration. No apparent delusions, illusions, hallucinations       Assessment & Plan:   1. Desire for pregnancy  Given that she does have  type I DM and she also desires a fertility work up, will refer to GYN. The patient indicates understanding of these issues and agrees with the plan.  Ambulatory referral to Obstetrics / Gynecology  2. DIAB W/O COMP TYPE I [JUV] NOT STATED UNCNTRL  a1c trending down now. Followed by Dr. Reuel Derby. Ambulatory referral to Obstetrics /  Gynecology

## 2011-09-01 ENCOUNTER — Ambulatory Visit (INDEPENDENT_AMBULATORY_CARE_PROVIDER_SITE_OTHER): Payer: 59 | Admitting: Family Medicine

## 2011-09-01 ENCOUNTER — Encounter: Payer: Self-pay | Admitting: Family Medicine

## 2011-09-01 VITALS — BP 120/82 | HR 103 | Ht 68.0 in | Wt 137.0 lb

## 2011-09-01 DIAGNOSIS — J309 Allergic rhinitis, unspecified: Secondary | ICD-10-CM

## 2011-09-01 DIAGNOSIS — N469 Male infertility, unspecified: Secondary | ICD-10-CM | POA: Insufficient documentation

## 2011-09-01 DIAGNOSIS — N979 Female infertility, unspecified: Secondary | ICD-10-CM

## 2011-09-01 DIAGNOSIS — Z01419 Encounter for gynecological examination (general) (routine) without abnormal findings: Secondary | ICD-10-CM

## 2011-09-01 DIAGNOSIS — Z1151 Encounter for screening for human papillomavirus (HPV): Secondary | ICD-10-CM

## 2011-09-01 NOTE — Patient Instructions (Signed)
Infertility WHAT IS INFERTILITY?  Infertility is usually defined as not being able to get pregnant after trying for one year of regular sexual intercourse without the use of contraceptives. Or not being able to carry a pregnancy to term and have a baby. The infertility rate in the [place] is around 10%. Pregnancy is the result of a chain of events. A woman must release an egg from one of her ovaries (ovulation). The egg must be fertilized by the female sperm. Then it travels through a fallopian tube into the uterus (womb), where it attaches to the wall of the uterus and grows. A man must have enough sperm, and the sperm must join with (fertilize) the egg along the way, at the proper time. The fertilized egg must then become attached to the inside of the uterus. While this may seem simple, many things can happen to prevent pregnancy from occurring.  WHOSE PROBLEM IS IT?  About 20% of infertility cases are due to problems with the man (female factors) and 65% are due to problems with the woman (female factors). Other cases are due to a combination of female and female factors or to unknown causes.  WHAT CAUSES INFERTILITY IN MEN?  Infertility in men is often caused by problems with making enough normal sperm or getting the sperm to reach the egg. Problems with sperm may exist from birth or develop later in life, due to illness or injury. Some men produce no sperm, or produce too few sperm (oligospermia). Other problems include:  Sexual dysfunction.   Hormonal or endocrine problems.   Age. Female fertility decreases with age, but not at as young an age as female fertility.   Infection.   Congenital problems. Birth defect, such as absence of the tubes that carry the sperm (vas deferens).   Genetic/chromosomal problems.   Antisperm antibody problems.   Retrograde ejaculation (sperm go into the bladder).   Varicoceles, spematoceles, or tumors of the testicles.   Lifestyle can influence the number  and quality of a man's sperm.   Alcohol and drugs can temporarily reduce sperm quality.   Environmental toxins, including pesticides and lead, may cause some cases of infertility in men.  WHAT CAUSES INFERTILITY IN WOMEN?   Problems with ovulation account for most infertility in women. Without ovulation, eggs are not available to be fertilized.   Signs of problems with ovulation include irregular menstrual periods or no periods at all.   Simple lifestyle factors, including stress, diet, or athletic training, can affect a woman's hormonal balance.   Age. Fertility begins to decrease in women in the early 30s and is worse after age 37.   Much less often, a hormonal imbalance from a serious medical problem, such as a pituitary gland tumor, thyroid or other chronic medical disease, can cause ovulation problems.   Pelvic infections.   Polycystic ovary syndrome (increase in female hormones, unable to ovulate).   Alcohol or illegal drugs.   Environmental toxins, radiation, pesticides, and certain chemicals.   Aging is an important factor in female infertility.   The ability of a woman's ovaries to produce eggs declines with age, especially after age 35. About one third of couples where the woman is over 35 will have problems with fertility.   By the time she reaches menopause when her monthly periods stop for good, a woman can no longer produce eggs or become pregnant.   Other problems can also lead to infertility in women. If the fallopian tubes are blocked   at one or both ends, the egg cannot travel through the tubes into the uterus. Scar tissue (adhesions) in the pelvis may cause blocked tubes. This may result from pelvic inflammatory disease, endometriosis, or surgery for an ectopic pregnancy (fertilized egg implanted outside the uterus) or any pelvic or abdominal surgery causing adhesions.   Fibroid tumors or polyps of the uterus.   Congenital (birth defect) abnormalities of the uterus.    Infection of the cervix (cervicitis).   Cervical stenosis (narrowing).   Abnormal cervical mucus.   Polycystic ovary syndrome.   Having sexual intercourse too often (every other day or 4 to 5 times a week).   Obesity.   Anorexia.   Poor nutrition.   Over exercising, with loss of body fat.   DES. Your mother received diethylstilbesterol hormone when pregnant with you.  HOW IS INFERTILITY TESTED?  If you have been trying to have a baby without success, you may want to seek medical help. You should not wait for one year of trying before seeing a health care provider if:  You are over 35.   You have reason to believe that there may be a fertility problem.  A medical evaluation may determine the reasons for a couple's infertility. Usually this process begins with:  Physical exams.   Medical histories of both partners.   Sexual histories of both partners.  If there is no obvious problem, like improperly timed intercourse or absence of ovulation, tests may be needed.   For a man, testing usually begins with tests of his semen to look at:   The number of sperm.   The shape of sperm.   Movement of his sperm.   Taking a complete medical and surgical history.   Physical examination.   Check for infection of the female reproductive organs.  Sometimes hormone tests are done.   For a woman, the first step in testing is to find out if she is ovulating each month. There are several ways to do this. For example, she can keep track of changes in her morning body temperature and in the texture of her cervical mucus. Another tool is a home ovulation test kit, which can be bought at drug or grocery stores.   Checks of ovulation can also be done in the doctor's office, using blood tests for hormone levels or ultrasound tests of the ovaries. If the woman is ovulating, more tests will need to be done. Some common female tests include:   Hysterosalpingogram: An x-ray of the fallopian  tubes and uterus after they are injected with dye. It shows if the tubes are open and shows the shape of the uterus.   Laparoscopy: An exam of the tubes and other female organs for disease. A lighted tube called a laparoscope is used to see inside the abdomen.   Endometrial biopsy: Sample of uterus tissue taken on the first day of the menstrual period, to see if the tissue indicates you are ovulating.   Transvaginal ultrasound: Examines the female organs.   Hysteroscopy: Uses a lighted tube to examine the cervix and inside the uterus, to see if there are any abnormalities inside the uterus.  TREATMENT  Depending on the test results, different treatments can be suggested. The type of treatment depends on the cause. 85 to 90% of infertility cases are treated with drugs or surgery.   Various fertility drugs may be used for women with ovulation problems. It is important to talk with your caregiver about the drug to   be used. You should understand the drug's benefits and side effects. Depending on the type of fertility drug and the dosage of the drug used, multiple births (twins or multiples) can occur in some women.   If needed, surgery can be done to repair damage to a woman's ovaries, fallopian tubes, cervix, or uterus.   Surgery or medical treatment for endometriosis or polycystic ovary syndrome. Sometimes a man has an infertility problem that can be corrected with medicine or by surgery.   Intrauterine insemination (IUI) of sperm, timed with ovulation.   Change in lifestyle, if that is the cause (lose weight, increase exercise, and stop smoking, drinking excessively, or taking illegal drugs).   Other types of surgery:   Removing growths inside and on the uterus.   Removing scar tissue from inside of the uterus.   Fixing blocked tubes.   Removing scar tissue in the pelvis and around the female organs.  WHAT IS ASSISTED REPRODUCTIVE TECHNOLOGY (ART)?  Assisted reproductive technology  (ART) is another form of special methods used to help infertile couples. ART involves handling both the woman's eggs and the man's sperm. Success rates vary and depend on many factors. ART can be expensive and time-consuming. But ART has made it possible for many couples to have children that otherwise would not have been conceived. Some methods are listed below:  In vitro fertilization (IVF). IVF is often used when a woman's fallopian tubes are blocked or when a man has low sperm counts. A drug is used to stimulate the ovaries to produce multiple eggs. Once mature, the eggs are removed and placed in a culture dish with the man's sperm for fertilization. After about 40 hours, the eggs are examined to see if they have become fertilized by the sperm and are dividing into cells. These fertilized eggs (embryos) are then placed in the woman's uterus. This bypasses the fallopian tubes.   Gamete intrafallopian transfer (GIFT) is similar to IVF, but used when the woman has at least one normal fallopian tube. Three to five eggs are placed in the fallopian tube, along with the man's sperm, for fertilization inside the woman's body.   Zygote intrafallopian transfer (ZIFT), also called tubal embryo transfer, combines IVF and GIFT. The eggs retrieved from the woman's ovaries are fertilized in the lab and placed in the fallopian tubes rather than in the uterus.   ART procedures sometimes involve the use of donor eggs (eggs from another woman) or previously frozen embryos. Donor eggs may be used if a woman has impaired ovaries or has a genetic disease that could be passed on to her baby.   When performing ART, you are at higher risk for resulting in multiple pregnancies, twins, triplets or more.   Intracytoplasma sperm injection is a procedure that injects a single sperm into the egg to fertilize it.   Embryo transplant is a procedure that starts after growing an embryo in a special media (chemical solution)  developed to keep the embryo alive for 2 to 5 days, and then transplanting it into the uterus.  In cases where a cause cannot be found and pregnancy does not occur, adoption may be a consideration. Document Released: 02/06/2003 Document Revised: 01/23/2011 Document Reviewed: 01/02/2009 [facility] Patient Information 2012 [place], [place].

## 2011-09-01 NOTE — Progress Notes (Signed)
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  History of present illness: Patient is a 26 y.o. G0P0000 who presents today as a referral from Dr. 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 The patient is on insulin pump and her latest hemoglobin A1c is 6.2. She has had improving glycemic control in the past 2 years. She has an endocrinologist because her diabetes and has checked her thyroid was normal. She has with her a six-month menstrual calendar which shows her menses occur every 30-32 days. She has been using ovulation prediction kit which is shows her to be ovulating. Her husband has no children. She has no history of intra-abdominal surgery, pelvic inflammatory disease, history of sexually transmitted disease. She has no history of painful cycles. Past Medical History  Diagnosis Date  . Diabetes mellitus    Past Surgical History  Procedure Date  . Wisdom tooth extraction 2012    x 4   Current Outpatient Prescriptions on File Prior to Visit  Medication Sig Dispense Refill  . insulin lispro (HUMALOG) 100 UNIT/ML injection Inject into the skin 3 (three) times daily before meals. humalog insulin pump      . loratadine (CLARITIN) 10 MG tablet Take 10 mg by mouth daily.       No Known Allergies History   Social History  . Marital Status: Married    Spouse Name: N/A    Number of Children: N/A  . Years of Education: N/A   Occupational History  . Not on file.   Social History Main Topics  . Smoking status: Never Smoker   . Smokeless tobacco: Not on file  . Alcohol Use: No  . Drug Use: No  . Sexually Active: Yes -- Female partner(s)    Birth Control/ Protection: None   Other Topics Concern  . Not on file   Social History Narrative  . No narrative on file   Family History  Problem Relation Age of Onset  . Gout Father   . Hypertension Father   . Diabetes Maternal Grandmother     type2  . Hypothyroidism Paternal Grandmother   .  Thyroid disease Paternal Grandmother   . Diabetes Paternal Grandfather     type 2  . Hypertension Paternal Grandfather    Review of Systems  Constitutional: Negative for fever, chills, weight loss and malaise/fatigue.  HENT: Negative for sore throat.   Respiratory: Negative for shortness of breath and wheezing.   Cardiovascular: Negative for chest pain.  Gastrointestinal: Negative for nausea, vomiting, abdominal pain, diarrhea and constipation.  Genitourinary: Negative for dysuria.  Musculoskeletal: Negative for myalgias.  Endo/Heme/Allergies: Does not bruise/bleed easily.   Physical Exam  Constitutional: She is oriented to person, place, and time. She appears well-developed and well-nourished.  Neck: Normal range of motion. No tracheal deviation present.  Cardiovascular: Normal rate and regular rhythm.   Pulmonary/Chest: Effort normal and breath sounds normal. She has no wheezes. Right breast exhibits no inverted nipple and no mass. Left breast exhibits no inverted nipple and no mass.  Abdominal: Soft. Bowel sounds are normal. She exhibits no mass. There is no tenderness.  Genitourinary: No breast tenderness or discharge.       BUS normal, vagina is pink and rugated, cervix is nulliparous without lesion, is small anteverted, no adnexal mass or tenderness.  Musculoskeletal: Normal range of motion. She exhibits no edema.  Lymphadenopathy:    She has no cervical adenopathy.    She has no  axillary adenopathy.  Neurological: She is alert and oriented to person, place, and time.  Skin: Skin is warm and dry. No rash noted.     Assesment/Plan:  1. ALLERGIC RHINITIS    2. Infertility, female  DG Hysterogram (HSD), Semen Analysis  3. Routine gynecological examination  Cytology - PAP

## 2011-09-08 ENCOUNTER — Telehealth: Payer: Self-pay | Admitting: *Deleted

## 2011-09-08 DIAGNOSIS — N979 Female infertility, unspecified: Secondary | ICD-10-CM

## 2011-09-12 ENCOUNTER — Ambulatory Visit (HOSPITAL_COMMUNITY)
Admission: RE | Admit: 2011-09-12 | Discharge: 2011-09-12 | Disposition: A | Discharge status: Home / Self Care | Payer: 59 | Source: Ambulatory Visit | Attending: Family Medicine | Admitting: Family Medicine

## 2011-09-12 DIAGNOSIS — N979 Female infertility, unspecified: Secondary | ICD-10-CM | POA: Insufficient documentation

## 2011-09-12 MED ORDER — IOHEXOL 300 MG/ML  SOLN: 4.0000 mL | Freq: Once | INTRAMUSCULAR | Status: AC | PRN

## 2011-09-19 ENCOUNTER — Ambulatory Visit (INDEPENDENT_AMBULATORY_CARE_PROVIDER_SITE_OTHER): Payer: 59 | Admitting: Family Medicine

## 2011-09-19 ENCOUNTER — Other Ambulatory Visit: Payer: Self-pay | Admitting: Family Medicine

## 2011-09-19 ENCOUNTER — Encounter: Payer: Self-pay | Admitting: Family Medicine

## 2011-09-19 VITALS — BP 120/79 | HR 79 | Ht 68.0 in | Wt 136.0 lb

## 2011-09-19 DIAGNOSIS — N979 Female infertility, unspecified: Secondary | ICD-10-CM

## 2011-09-19 NOTE — Progress Notes (Signed)
  Subjective:    Patient ID: Karen Alexander, female    DOB: [birth year], 26 y.o.   MRN: [id number]  HPI Patient returns for followup of results. She was previously seen for workup for her primary infertility. As stated previously she does ovulate monthly. She underwent HSG which was completely normal. Her husband is going for semen analysis along 22nd. Her grandmother was recently diagnosed with celiac sprue and she wonders if this could be part of the problem for her. She reports some recurrent diarrhea but no blood in her stool, no weight loss, no worsening diarrhea with gluten products. She had testing this in 2011 with a low normal IgA. She has not had total IgA testing. She undergoes yearly thyroid testing with her endocrinologist and her last testing was in November or February and was normal.   Review of Systems  Constitutional: Negative for fever and fatigue.  Gastrointestinal: Negative for abdominal pain.  Genitourinary: Negative for vaginal bleeding, vaginal pain and menstrual problem.  Skin: Negative for rash.       Objective:   Physical Exam  Vitals reviewed. Constitutional: She appears well-developed and well-nourished.  HENT:  Head: Normocephalic and atraumatic.  Neck: Neck supple.  Cardiovascular: Normal rate.   Pulmonary/Chest: Effort normal.  Abdominal: Soft. There is no tenderness.          Assessment & Plan:   1. Infertility, female  Ambulatory referral to Endocrinology   We will pursue celiac sprue testing, with a TTA IgA, IgG, and total IgA. The patient desires to wait and see what the semen analysis shows. I have discussed with her possibly REI referral and put this in the computer. She will call back when she is ready to proceed with this.

## 2011-09-19 NOTE — Patient Instructions (Signed)
Infertility WHAT IS INFERTILITY?  Infertility is usually defined as not being able to get pregnant after trying for one year of regular sexual intercourse without the use of contraceptives. Or not being able to carry a pregnancy to term and have a baby. The infertility rate in the [place] is around 10%. Pregnancy is the result of a chain of events. A woman must release an egg from one of her ovaries (ovulation). The egg must be fertilized by the female sperm. Then it travels through a fallopian tube into the uterus (womb), where it attaches to the wall of the uterus and grows. A man must have enough sperm, and the sperm must join with (fertilize) the egg along the way, at the proper time. The fertilized egg must then become attached to the inside of the uterus. While this may seem simple, many things can happen to prevent pregnancy from occurring.  WHOSE PROBLEM IS IT?  About 20% of infertility cases are due to problems with the man (female factors) and 65% are due to problems with the woman (female factors). Other cases are due to a combination of female and female factors or to unknown causes.  WHAT CAUSES INFERTILITY IN MEN?  Infertility in men is often caused by problems with making enough normal sperm or getting the sperm to reach the egg. Problems with sperm may exist from birth or develop later in life, due to illness or injury. Some men produce no sperm, or produce too few sperm (oligospermia). Other problems include:  Sexual dysfunction.   Hormonal or endocrine problems.   Age. Female fertility decreases with age, but not at as young an age as female fertility.   Infection.   Congenital problems. Birth defect, such as absence of the tubes that carry the sperm (vas deferens).   Genetic/chromosomal problems.   Antisperm antibody problems.   Retrograde ejaculation (sperm go into the bladder).   Varicoceles, spematoceles, or tumors of the testicles.   Lifestyle can influence the number  and quality of a man's sperm.   Alcohol and drugs can temporarily reduce sperm quality.   Environmental toxins, including pesticides and lead, may cause some cases of infertility in men.  WHAT CAUSES INFERTILITY IN WOMEN?   Problems with ovulation account for most infertility in women. Without ovulation, eggs are not available to be fertilized.   Signs of problems with ovulation include irregular menstrual periods or no periods at all.   Simple lifestyle factors, including stress, diet, or athletic training, can affect a woman's hormonal balance.   Age. Fertility begins to decrease in women in the early 30s and is worse after age 37.   Much less often, a hormonal imbalance from a serious medical problem, such as a pituitary gland tumor, thyroid or other chronic medical disease, can cause ovulation problems.   Pelvic infections.   Polycystic ovary syndrome (increase in female hormones, unable to ovulate).   Alcohol or illegal drugs.   Environmental toxins, radiation, pesticides, and certain chemicals.   Aging is an important factor in female infertility.   The ability of a woman's ovaries to produce eggs declines with age, especially after age 35. About one third of couples where the woman is over 35 will have problems with fertility.   By the time she reaches menopause when her monthly periods stop for good, a woman can no longer produce eggs or become pregnant.   Other problems can also lead to infertility in women. If the fallopian tubes are blocked   at one or both ends, the egg cannot travel through the tubes into the uterus. Scar tissue (adhesions) in the pelvis may cause blocked tubes. This may result from pelvic inflammatory disease, endometriosis, or surgery for an ectopic pregnancy (fertilized egg implanted outside the uterus) or any pelvic or abdominal surgery causing adhesions.   Fibroid tumors or polyps of the uterus.   Congenital (birth defect) abnormalities of the uterus.    Infection of the cervix (cervicitis).   Cervical stenosis (narrowing).   Abnormal cervical mucus.   Polycystic ovary syndrome.   Having sexual intercourse too often (every other day or 4 to 5 times a week).   Obesity.   Anorexia.   Poor nutrition.   Over exercising, with loss of body fat.   DES. Your mother received diethylstilbesterol hormone when pregnant with you.  HOW IS INFERTILITY TESTED?  If you have been trying to have a baby without success, you may want to seek medical help. You should not wait for one year of trying before seeing a health care provider if:  You are over 35.   You have reason to believe that there may be a fertility problem.  A medical evaluation may determine the reasons for a couple's infertility. Usually this process begins with:  Physical exams.   Medical histories of both partners.   Sexual histories of both partners.  If there is no obvious problem, like improperly timed intercourse or absence of ovulation, tests may be needed.   For a man, testing usually begins with tests of his semen to look at:   The number of sperm.   The shape of sperm.   Movement of his sperm.   Taking a complete medical and surgical history.   Physical examination.   Check for infection of the female reproductive organs.  Sometimes hormone tests are done.   For a woman, the first step in testing is to find out if she is ovulating each month. There are several ways to do this. For example, she can keep track of changes in her morning body temperature and in the texture of her cervical mucus. Another tool is a home ovulation test kit, which can be bought at drug or grocery stores.   Checks of ovulation can also be done in the doctor's office, using blood tests for hormone levels or ultrasound tests of the ovaries. If the woman is ovulating, more tests will need to be done. Some common female tests include:   Hysterosalpingogram: An x-ray of the fallopian  tubes and uterus after they are injected with dye. It shows if the tubes are open and shows the shape of the uterus.   Laparoscopy: An exam of the tubes and other female organs for disease. A lighted tube called a laparoscope is used to see inside the abdomen.   Endometrial biopsy: Sample of uterus tissue taken on the first day of the menstrual period, to see if the tissue indicates you are ovulating.   Transvaginal ultrasound: Examines the female organs.   Hysteroscopy: Uses a lighted tube to examine the cervix and inside the uterus, to see if there are any abnormalities inside the uterus.  TREATMENT  Depending on the test results, different treatments can be suggested. The type of treatment depends on the cause. 85 to 90% of infertility cases are treated with drugs or surgery.   Various fertility drugs may be used for women with ovulation problems. It is important to talk with your caregiver about the drug to   be used. You should understand the drug's benefits and side effects. Depending on the type of fertility drug and the dosage of the drug used, multiple births (twins or multiples) can occur in some women.   If needed, surgery can be done to repair damage to a woman's ovaries, fallopian tubes, cervix, or uterus.   Surgery or medical treatment for endometriosis or polycystic ovary syndrome. Sometimes a man has an infertility problem that can be corrected with medicine or by surgery.   Intrauterine insemination (IUI) of sperm, timed with ovulation.   Change in lifestyle, if that is the cause (lose weight, increase exercise, and stop smoking, drinking excessively, or taking illegal drugs).   Other types of surgery:   Removing growths inside and on the uterus.   Removing scar tissue from inside of the uterus.   Fixing blocked tubes.   Removing scar tissue in the pelvis and around the female organs.  WHAT IS ASSISTED REPRODUCTIVE TECHNOLOGY (ART)?  Assisted reproductive technology  (ART) is another form of special methods used to help infertile couples. ART involves handling both the woman's eggs and the man's sperm. Success rates vary and depend on many factors. ART can be expensive and time-consuming. But ART has made it possible for many couples to have children that otherwise would not have been conceived. Some methods are listed below:  In vitro fertilization (IVF). IVF is often used when a woman's fallopian tubes are blocked or when a man has low sperm counts. A drug is used to stimulate the ovaries to produce multiple eggs. Once mature, the eggs are removed and placed in a culture dish with the man's sperm for fertilization. After about 40 hours, the eggs are examined to see if they have become fertilized by the sperm and are dividing into cells. These fertilized eggs (embryos) are then placed in the woman's uterus. This bypasses the fallopian tubes.   Gamete intrafallopian transfer (GIFT) is similar to IVF, but used when the woman has at least one normal fallopian tube. Three to five eggs are placed in the fallopian tube, along with the man's sperm, for fertilization inside the woman's body.   Zygote intrafallopian transfer (ZIFT), also called tubal embryo transfer, combines IVF and GIFT. The eggs retrieved from the woman's ovaries are fertilized in the lab and placed in the fallopian tubes rather than in the uterus.   ART procedures sometimes involve the use of donor eggs (eggs from another woman) or previously frozen embryos. Donor eggs may be used if a woman has impaired ovaries or has a genetic disease that could be passed on to her baby.   When performing ART, you are at higher risk for resulting in multiple pregnancies, twins, triplets or more.   Intracytoplasma sperm injection is a procedure that injects a single sperm into the egg to fertilize it.   Embryo transplant is a procedure that starts after growing an embryo in a special media (chemical solution)  developed to keep the embryo alive for 2 to 5 days, and then transplanting it into the uterus.  In cases where a cause cannot be found and pregnancy does not occur, adoption may be a consideration. Document Released: 02/06/2003 Document Revised: 01/23/2011 Document Reviewed: 01/02/2009 [employer] Patient Information 2012 [employer]. 

## 2011-09-24 ENCOUNTER — Encounter: Payer: Self-pay | Admitting: Family Medicine

## 2011-09-25 LAB — CELIAC PANEL 10
Antigliadin Abs, IgA: 4 units (ref 0–19)
Gliadin IgG: 4 units (ref 0–19)
Tissue Transglut Ab: <2 U/mL (ref 0–5)
Transglutaminase IgA: <2 U/mL (ref 0–3)

## 2011-10-23 NOTE — Telephone Encounter (Signed)
Visit interrupted and completed by other cma at practice.

## 2011-12-05 ENCOUNTER — Ambulatory Visit (INDEPENDENT_AMBULATORY_CARE_PROVIDER_SITE_OTHER): Payer: 59 | Admitting: Family Medicine

## 2011-12-05 DIAGNOSIS — E119 Type 2 diabetes mellitus without complications: Secondary | ICD-10-CM

## 2011-12-05 NOTE — Progress Notes (Signed)
Patient presents today for 3 month DM follow-up as part of the employer-sponsored Link to Wellness program. Medications, insulin regimen, and glucose readings have been reviewed. I have also discussed with patient lifestyle interventions such as diet and physical activity. Details of this visit can be found in [employer] documenting program through [facility]). Patient has set a series of personal goals and will follow-up in 3 months for further review of DM.   Seen by Gerre Pebbles, PharmD

## 2011-12-16 NOTE — Progress Notes (Signed)
Patient ID: Karen Alexander, female   DOB: Dec 03, 1985, 26 y.o.   MRN: [id number] Reviewed and agree with documentation and management.

## 2012-06-04 NOTE — Progress Notes (Signed)
Established Patient - Follow-Up Evaluation  [facility] Consult - Clinical Pharmacist  Care Team Member: Gerre Pebbles     Subjective:  Patient is here for a 3 month follow up appointment as part of Link to Wellness, an employer sponsored wellness program. No medication changes. She states that things have been going well.   She recently saw her endocrinologist and her A1C was 6.9%. Dr. Tedd Sias made no changes at that time.    Disease Assessments:  Diabetes:  Type of Diabetes: Type 1; checks feet daily; checks blood glucose more than 4 times a day; Sees Diabetes provider 4 or more times per year; MD managing Diabetes Dr. Tedd Sias [facility]; uses glucometer; takes medications as prescribed; does not take an aspirin a day; Diabetes Education 2011;  hypoglycemia frequency infrequently; 7 day CBG average 102; 14 day CBG average 118; 30 day CBG average 122; Highest CBG 247; Lowest CBG 68; Other  Diabetes History: She is checking her blood sugar 3-4 times daily.   Last appointment with her endocrinologist was last month. No pump settings were changed- everything is the same as before.      Social History:  Caffeine use: cola2 cans of diet pepsi daily; Exercise adherence 3-4 days a week Walking and mild resistance training about 3 times per week. ;  Medication adherence adherent; Patient can afford medications; Patient knows the purpose/use of medications;   Diet adherence Greater than 75% of the time Continues to make healthy choices and maintains a healthy diet. No major changes at this time.  Denies alcohol use;   Physical Activity- 90 minutes of exercise per week. walking, crunches, squats, lunges three times a week for 30 minutes.   Nutrition- She reports that her office went on [employer] in March and she was eating out for lunch more frequently. She thinks this is the reason that her A1C increased from 6.4% to 6.9%. Now she has returned to eating normally and is cooking the majority of his  meals. She reports that her husband is on a diet and he has lost 18 pounds. They also haven't been eating out as frequently.   Hemoglobin A1c: 05/18/2012 Via Dr. Tedd Sias in April 6.9    Dilated Eye Exam: 10/01/2011 Last exam in August 2012, routine every year.  Flu vaccine: 11/17/2011  Foot Exam: 05/10/2012  Other Preventive Care Notes:  Dental visit in April 2014   Assessment/Plan: Additional Comments: Patient is a 27 year old female with type 1 diabetes who is under excellent glycemic control with an A1C of 6.9%. She continues to excercise regularly and is making healthy eating choices. Her weight is stable at 136 pounds.   She is not taking an ACE-I, statin or ASA due to child bearing potential.   Patient has been meeting all of her goals and has a good comprehension on how to treat her disease. I will see her back in 6 months..  Goals for Next Visit-  1. Continue to keep A1C under 7%.  2. Continue physical activity with a goal of 150 minutes weekly.  Next appointment with me is October 17th at 1:30 PM.      Ellender Hose. Vivia Ewing, PharmD, BCPS

## 2012-06-07 ENCOUNTER — Ambulatory Visit (INDEPENDENT_AMBULATORY_CARE_PROVIDER_SITE_OTHER): Payer: Self-pay | Admitting: Family Medicine

## 2012-06-07 ENCOUNTER — Encounter: Payer: Self-pay | Admitting: *Deleted

## 2012-06-07 VITALS — BP 110/66 | HR 79 | Ht 68.0 in | Wt 135.0 lb

## 2012-06-07 DIAGNOSIS — E119 Type 2 diabetes mellitus without complications: Secondary | ICD-10-CM

## 2012-06-21 NOTE — Progress Notes (Signed)
Patient ID: Karen Alexander, female   DOB: [birth year], 27 y.o.   MRN: [id number] ATTENDING PHYSICIAN NOTE: I have reviewed the chart and agree with the plan as detailed above. Denny Levy MD Pager [phone]

## 2012-08-21 ENCOUNTER — Other Ambulatory Visit: Payer: Self-pay

## 2012-08-26 ENCOUNTER — Encounter: Payer: Self-pay | Admitting: Family Medicine

## 2012-09-01 ENCOUNTER — Encounter: Payer: Self-pay | Admitting: Family Medicine

## 2012-09-02 LAB — OB RESULTS CONSOLE HEPATITIS B SURFACE ANTIGEN: HEP B S AG: NEGATIVE

## 2012-09-02 LAB — OB RESULTS CONSOLE RPR: RPR: NONREACTIVE

## 2012-09-02 LAB — OB RESULTS CONSOLE ABO/RH: RH Type: NEGATIVE

## 2012-09-02 LAB — OB RESULTS CONSOLE HIV ANTIBODY (ROUTINE TESTING): HIV: NONREACTIVE

## 2012-09-02 LAB — OB RESULTS CONSOLE GC/CHLAMYDIA
Chlamydia: NEGATIVE
GC PROBE AMP, GENITAL: NEGATIVE

## 2012-11-25 ENCOUNTER — Encounter: Payer: Self-pay | Admitting: *Deleted

## 2012-11-25 ENCOUNTER — Ambulatory Visit (INDEPENDENT_AMBULATORY_CARE_PROVIDER_SITE_OTHER): Payer: 59 | Admitting: *Deleted

## 2012-11-25 VITALS — BP 120/69 | Wt 134.8 lb

## 2012-11-25 DIAGNOSIS — Z3401 Encounter for supervision of normal first pregnancy, first trimester: Secondary | ICD-10-CM

## 2012-11-25 DIAGNOSIS — Z3400 Encounter for supervision of normal first pregnancy, unspecified trimester: Secondary | ICD-10-CM

## 2012-11-25 NOTE — Progress Notes (Signed)
P-84 Patient is here for New OB appointment.  She underwent IVF at [facility] and is 9wks and 6 days today confirmed by bedside ultrasound.  She is doing well and she had bloodwork done at [facility] and we will await those results before drawing blood here.  She will rtc in two weeks with Dr. Shawnie Pons as she has seen her before and would like to see her for her first visit.

## 2012-12-03 ENCOUNTER — Ambulatory Visit (INDEPENDENT_AMBULATORY_CARE_PROVIDER_SITE_OTHER): Payer: 59 | Admitting: Family Medicine

## 2012-12-03 VITALS — BP 118/78 | HR 85 | Ht 68.0 in | Wt 134.0 lb

## 2012-12-03 DIAGNOSIS — E119 Type 2 diabetes mellitus without complications: Secondary | ICD-10-CM

## 2012-12-03 NOTE — Progress Notes (Signed)
Subjective:  Patient presents today for 6 month diabetes follow-up as part of the employer-sponsored Link to Wellness program. Current diabetes regimen includes Humalog insulin via pump.   Patient is now [redacted] weeks pregnant. She has started taking a prenatal vitamin. During the IVF process her blood sugars were elevated because she developed OHSS, but now she reports that blood sugars have returned to normal.   Her last appointment with the endocrinologist was a few weeks ago. Her A1C was 6.6% 3 months ago. She had her A1C checked today and has a pending appointment with the endocrinologist next week. She has started wearing a CGM and reports that it was giving her erratic readings. When she changed the CGM site she found that the cannula was kinked. She spoke with a representative with [employer] and was told that she could wear the CGM sensor in her upper thigh. Patient is very thin and states she finds it difficult to find sites that she isn't hitting muscle when she inserts the sensor.      Disease Assessments:  Diabetes:  Type of Diabetes: Type 1; checks feet daily; checks blood glucose more than 4 times a day; Sees Diabetes provider 4 or more times per year; MD managing Diabetes Dr. Tedd Sias [facility]; uses glucometer; takes medications as prescribed; does not take an aspirin a day; Diabetes Education 2011; hypoglycemia frequency infrequently;   Highest CBG 204; Lowest CBG 56; 7 day CBG average 103; 14 day CBG average 106; 30 day CBG average 108;   Other Diabetes History: She has had her insulin pump settings changed since she has been pregnant. At first she was going low but in the past few weeks she hasn't had as many lows.   I downloaded patient's meter and she is doing great. A few lows, but not many. The majority of readings are between 80-140. After dinner readings are usually the most elevated and are up to around 200. Patient reports that it is the worst time for her and CBGs are often  elevated around that time.   patient goes into the endocrinologist next week for further pump titration.    Physical Activity- walking now, still doing 8 pound weights, lunges. She is doing this three days a week.   Nutrition- She reports that she is somewhat queasy lately. After she did IVF she wasn't hungry and didn't want to eat much. Her weight is stable since last appointment.       Dilated Eye Exam: 11/26/2012 Flu vaccine: 11/05/2012  Foot Exam: 05/10/2012  Other Preventive Care Notes:  Dental visit in April 2014      Testing:  Blood Sugar Tests:  Hemoglobin A1c: 6.6 resulted on 08/27/2012    Assessment/Plan: Patient is a 27 year old female with DM1. Her last A1C over the summer was 6.6% and is meeting goal of less than 7%. Patient is now pregnant and is [redacted] weeks along. She has met several times with her endocrinologist and has an appointment pending for next week. Her endo wants her A1C to be close to 6% in order to prevent complications. She will be wearing a CGM during her pregnancy and will have close follow up with her endocrinologist.   Patient continues to exercise and to make healthy eating choices. Weight is stable and is 2 pounds less than at her last visit. Patient is committed to keeping blood sugars within range, is diligent with checking blood sugar and is adherent with giving insulin. Given her past history  of adherence and close follow up with her endocrinologist, I am OK letting her see me back in 6 months..    Goals for Next Visit-  1. Wear your CGM this week. Try alternate sites to see if this helps prevent kinking. If you're consistently getting strange measurements, change the site.  2. Continue physical activity.  3. Continue working with your endocrinologist to keep your A1C at goal.  Next appointment to see me is April 22, 2013 at 1:30 PM.

## 2012-12-14 ENCOUNTER — Ambulatory Visit (INDEPENDENT_AMBULATORY_CARE_PROVIDER_SITE_OTHER): Payer: 59 | Admitting: Family Medicine

## 2012-12-14 ENCOUNTER — Encounter: Payer: Self-pay | Admitting: Family Medicine

## 2012-12-14 VITALS — BP 121/76 | Wt 135.0 lb

## 2012-12-14 DIAGNOSIS — Z3401 Encounter for supervision of normal first pregnancy, first trimester: Secondary | ICD-10-CM

## 2012-12-14 DIAGNOSIS — Z3400 Encounter for supervision of normal first pregnancy, unspecified trimester: Secondary | ICD-10-CM

## 2012-12-14 DIAGNOSIS — O24919 Unspecified diabetes mellitus in pregnancy, unspecified trimester: Secondary | ICD-10-CM

## 2012-12-14 DIAGNOSIS — O24911 Unspecified diabetes mellitus in pregnancy, first trimester: Secondary | ICD-10-CM

## 2012-12-14 DIAGNOSIS — E109 Type 1 diabetes mellitus without complications: Secondary | ICD-10-CM

## 2012-12-14 LAB — OB RESULTS CONSOLE GC/CHLAMYDIA
Chlamydia: NEGATIVE
Gonorrhea: NEGATIVE

## 2012-12-14 NOTE — Progress Notes (Signed)
HgbA1C is 5.9   Subjective:    Karen Alexander is a G1P0000 [redacted]w[redacted]d being seen today for her first obstetrical visit.  Her obstetrical history is significant for infertility, and Type 1 Diabetes, on insulin pump.  Pregnancy is result of ICSI and IVF (Embryo transfer on 8/20). Patient does intend to breast feed. Pregnancy history fully reviewed.  Patient reports no complaints.  Filed Vitals:   12/14/12 1549  BP: 121/76  Weight: 135 lb (61.236 kg)    HISTORY: OB History  Gravida Para Term Preterm AB SAB TAB Ectopic Multiple Living  1 0 0 0 0 0 0 0 0 0     # Outcome Date GA Lbr Len/2nd Weight Sex Delivery Anes PTL Lv  1 CUR              Past Medical History  Diagnosis Date  . Diabetes mellitus    Past Surgical History  Procedure Laterality Date  . Wisdom tooth extraction  2012    x 4  . Egg retrieval  August   Family History  Problem Relation Age of Onset  . Gout Father   . Hypertension Father   . Diabetes Maternal Grandmother     type2  . Hypothyroidism Paternal Grandmother   . Thyroid disease Paternal Grandmother   . Diabetes Paternal Grandfather     type 2  . Hypertension Paternal Grandfather      Exam     Skin: normal coloration and turgor, no rashes    Neurologic: oriented   Extremities: normal strength, tone, and muscle mass   HEENT sclera clear, anicteric   Mouth/Teeth mucous membranes moist, pharynx normal without lesions   Neck supple   Cardiovascular: regular rate and rhythm, no murmurs or gallops   Respiratory:  appears well, vitals normal, no respiratory distress, acyanotic, normal RR, ear and throat exam is normal, neck free of mass or lymphadenopathy, chest clear, no wheezing, crepitations, rhonchi, normal symmetric air entry   Abdomen: soft, non-tender; bowel sounds normal; no masses,  no organomegaly      Assessment:    Pregnancy: G1P0000 Patient Active Problem List   Diagnosis Date Noted  . Supervision of normal first pregnancy-product of  IVF 12/14/2012    Priority: High  . Diabetes mellitus, antepartum 12/14/2012    Priority: Medium  . 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 Patient desires pregnancy 08/15/2011  . DIAB W/O COMP TYPE I [JUV] NOT STATED UNCNTRL 01/30/2010  . ALLERGIC RHINITIS 01/30/2010        Plan:     Initial labs drawn. Prenatal vitamins. Problem list reviewed and updated. Genetic Screening discussed First Screen: declined.  Ultrasound discussed; fetal survey: discussed.  Follow up in 3 weeks. Implications of Diabetes discussed with pt.   Endocrinology will follow and manage pump.  New OB labs done in [facility] records. Nml pap in 2013 and HPV neg.   Avigdor Dollar S 12/14/2012

## 2012-12-14 NOTE — Progress Notes (Signed)
P = 87 

## 2012-12-14 NOTE — Patient Instructions (Signed)
Pregnancy - First Trimester During sexual intercourse, millions of sperm go into the vagina. Only 1 sperm will penetrate and fertilize the female egg while it is in the Fallopian tube. One week later, the fertilized egg implants into the wall of the uterus. An embryo begins to develop into a baby. At 6 to 8 weeks, the eyes and face are formed and the heartbeat can be seen on ultrasound. At the end of 12 weeks (first trimester), all the baby's organs are formed. Now that you are pregnant, you will want to do everything you can to have a healthy baby. Two of the most important things are to get good prenatal care and follow your caregiver's instructions. Prenatal care is all the medical care you receive before the baby's birth. It is given to prevent, find, and treat problems during the pregnancy and childbirth. PRENATAL EXAMS  During prenatal visits, your weight, blood pressure, and urine are checked. This is done to make sure you are healthy and progressing normally during the pregnancy.  A pregnant woman should gain 25 to 35 pounds during the pregnancy. However, if you are overweight or underweight, your caregiver will advise you regarding your weight.  Your caregiver will ask and answer questions for you.  Blood work, cervical cultures, other necessary tests, and a Pap test are done during your prenatal exams. These tests are done to check on your health and the probable health of your baby. Tests are strongly recommended and done for HIV with your permission. This is the virus that causes AIDS. These tests are done because medicines can be given to help prevent your baby from being born with this infection should you have been infected without knowing it. Blood work is also used to find out your blood type, previous infections, and follow your blood levels (hemoglobin).  Low hemoglobin (anemia) is common during pregnancy. Iron and vitamins are given to help prevent this. Later in the pregnancy,  blood tests for diabetes will be done along with any other tests if any problems develop.  You may need other tests to make sure you and the baby are doing well. CHANGES DURING THE FIRST TRIMESTER  Your body goes through many changes during pregnancy. They vary from person to person. Talk to your caregiver about changes you notice and are concerned about. Changes can include:  Your menstrual period stops.  The egg and sperm carry the genes that determine what you look like. Genes from you and your partner are forming a baby. The female genes determine whether the baby is a boy or a girl.  Your body increases in girth and you may feel bloated.  Feeling sick to your stomach (nauseous) and throwing up (vomiting). If the vomiting is uncontrollable, call your caregiver.  Your breasts will begin to enlarge and become tender.  Your nipples may stick out more and become darker.  The need to urinate more. Painful urination may mean you have a bladder infection.  Tiring easily.  Loss of appetite.  Cravings for certain kinds of food.  At first, you may gain or lose a couple of pounds.  You may have changes in your emotions from day to day (excited to be pregnant or concerned something may go wrong with the pregnancy and baby).  You may have more vivid and strange dreams. HOME CARE INSTRUCTIONS   It is very important to avoid all smoking, alcohol and non-prescribed drugs during your pregnancy. These affect the formation and growth of the baby.   Avoid chemicals while pregnant to ensure the delivery of a healthy infant.  Start your prenatal visits by the 12th week of pregnancy. They are usually scheduled monthly at first, then more often in the last 2 months before delivery. Keep your caregiver's appointments. Follow your caregiver's instructions regarding medicine use, blood and lab tests, exercise, and diet.  During pregnancy, you are providing food for you and your baby. Eat regular,  well-balanced meals. Choose foods such as meat, fish, milk and other low fat dairy products, vegetables, fruits, and whole-grain breads and cereals. Your caregiver will tell you of the ideal weight gain.  You can help morning sickness by keeping soda crackers at the bedside. Eat a couple before arising in the morning. You may want to use the crackers without salt on them.  Eating 4 to 5 small meals rather than 3 large meals a day also may help the nausea and vomiting.  Drinking liquids between meals instead of during meals also seems to help nausea and vomiting.  A physical sexual relationship may be continued throughout pregnancy if there are no other problems. Problems may be early (premature) leaking of amniotic fluid from the membranes, vaginal bleeding, or belly (abdominal) pain.  Exercise regularly if there are no restrictions. Check with your caregiver or physical therapist if you are unsure of the safety of some of your exercises. Greater weight gain will occur in the last 2 trimesters of pregnancy. Exercising will help:  Control your weight.  Keep you in shape.  Prepare you for labor and delivery.  Help you lose your pregnancy weight after you deliver your baby.  Wear a good support or jogging bra for breast tenderness during pregnancy. This may help if worn during sleep too.  Ask when prenatal classes are available. Begin classes when they are offered.  Do not use hot tubs, steam rooms, or saunas.  Wear your seat belt when driving. This protects you and your baby if you are in an accident.  Avoid raw meat, uncooked cheese, cat litter boxes, and soil used by cats throughout the pregnancy. These carry germs that can cause birth defects in the baby.  The first trimester is a good time to visit your dentist for your dental health. Getting your teeth cleaned is okay. Use a softer toothbrush and brush gently during pregnancy.  Ask for help if you have financial, counseling, or  nutritional needs during pregnancy. Your caregiver will be able to offer counseling for these needs as well as refer you for other special needs.  Do not take any medicines or herbs unless told by your caregiver.  Inform your caregiver if there is any mental or physical domestic violence.  Make a list of emergency phone numbers of family, friends, hospital, and police and fire departments.  Write down your questions. Take them to your prenatal visit.  Do not douche.  Do not cross your legs.  If you have to stand for long periods of time, rotate you feet or take small steps in a circle.  You may have more vaginal secretions that may require a sanitary pad. Do not use tampons or scented sanitary pads. MEDICINES AND DRUG USE IN PREGNANCY  Take prenatal vitamins as directed. The vitamin should contain 1 milligram of folic acid. Keep all vitamins out of reach of children. Only a couple vitamins or tablets containing iron may be fatal to a baby or young child when ingested.  Avoid use of all medicines, including herbs, over-the-counter medicines, not   prescribed or suggested by your caregiver. Only take over-the-counter or prescription medicines for pain, discomfort, or fever as directed by your caregiver. Do not use aspirin, ibuprofen, or naproxen unless directed by your caregiver.  Let your caregiver also know about herbs you may be using.  Alcohol is related to a number of birth defects. This includes fetal alcohol syndrome. All alcohol, in any form, should be avoided completely. Smoking will cause low birth rate and premature babies.  Street or illegal drugs are very harmful to the baby. They are absolutely forbidden. A baby born to an addicted mother will be addicted at birth. The baby will go through the same withdrawal an adult does.  Let your caregiver know about any medicines that you have to take and for what reason you take them. SEEK MEDICAL CARE IF:  You have any concerns or  worries during your pregnancy. It is better to call with your questions if you feel they cannot wait, rather than worry about them. SEEK IMMEDIATE MEDICAL CARE IF:   An unexplained oral temperature above 102 F (38.9 C) develops, or as your caregiver suggests.  You have leaking of fluid from the vagina (birth canal). If leaking membranes are suspected, take your temperature and inform your caregiver of this when you call.  There is vaginal spotting or bleeding. Notify your caregiver of the amount and how many pads are used.  You develop a bad smelling vaginal discharge with a change in the color.  You continue to feel sick to your stomach (nauseated) and have no relief from remedies suggested. You vomit blood or coffee ground-like materials.  You lose more than 2 pounds of weight in 1 week.  You gain more than 2 pounds of weight in 1 week and you notice swelling of your face, hands, feet, or legs.  You gain 5 pounds or more in 1 week (even if you do not have swelling of your hands, face, legs, or feet).  You get exposed to [place] measles and have never had them.  You are exposed to fifth disease or chickenpox.  You develop belly (abdominal) pain. Round ligament discomfort is a common non-cancerous (benign) cause of abdominal pain in pregnancy. Your caregiver still must evaluate this.  You develop headache, fever, diarrhea, pain with urination, or shortness of breath.  You fall or are in a car accident or have any kind of trauma.  There is mental or physical violence in your home. Document Released: 01/28/2001 Document Revised: 10/29/2011 Document Reviewed: 08/01/2008 [facility] Patient Information 2014 [place].  Gestational Diabetes Mellitus Gestational diabetes mellitus, often simply referred to as gestational diabetes, is a type of diabetes that some women develop during pregnancy. In gestational diabetes, the pancreas does not make enough insulin (a hormone), the cells are  less responsive to the insulin that is made (insulin resistance), or both.Normally, insulin moves sugars from food into the tissue cells. The tissue cells use the sugars for energy. The lack of insulin or the lack of normal response to insulin causes excess sugars to build up in the blood instead of going into the tissue cells. As a result, high blood sugar (hyperglycemia) develops. The effect of high sugar (glucose) levels can cause many complications.  RISK FACTORS You have an increased chance of developing gestational diabetes if you have a family history of diabetes and also have one or more of the following risk factors:  A body mass index over 30 (obesity).  A previous pregnancy with gestational diabetes.  An older age at the time of pregnancy. If blood glucose levels are kept in the normal range during pregnancy, women can have a healthy pregnancy. If your blood glucose levels are not well controlled, there may be risks to you, your unborn baby (fetus), your labor and delivery, or your newborn baby.  SYMPTOMS  If symptoms are experienced, they are much like symptoms you would normally expect during pregnancy. The symptoms of gestational diabetes include:   Increased thirst (polydipsia).  Increased urination (polyuria).  Increased urination during the night (nocturia).  Weight loss. This weight loss may be rapid.  Frequent, recurring infections.  Tiredness (fatigue).  Weakness.  Vision changes, such as blurred vision.  Fruity smell to your breath.  Abdominal pain. DIAGNOSIS Diabetes is diagnosed when blood glucose levels are increased. Your blood glucose level may be checked by one or more of the following blood tests:  A fasting blood glucose test. You will not be allowed to eat for at least 8 hours before a blood sample is taken.  A random blood glucose test. Your blood glucose is checked at any time of the day regardless of when you ate.  A hemoglobin A1c blood  glucose test. A hemoglobin A1c test provides information about blood glucose control over the previous 3 months.  An oral glucose tolerance test (OGTT). Your blood glucose is measured after you have not eaten (fasted) for 1 3 hours and then after you drink a glucose-containing beverage. Since the hormones that cause insulin resistance are highest at about 24 28 weeks of a pregnancy, an OGTT is usually performed during that time. If you have risk factors for gestational diabetes, your caregiver may test you for gestational diabetes earlier than 24 weeks of pregnancy. TREATMENT   You will need to take diabetes medicine or insulin daily to keep blood glucose levels in the desired range.  You will need to match insulin dosing with exercise and healthy food choices. The treatment goal is to maintain the before meal (preprandial), bedtime, and overnight blood glucose level at 60 99 mg/dL during pregnancy. The treatment goal is to further maintain peak after meal blood sugar (postprandial glucose) level at 100 140 mg/dL.  HOME CARE INSTRUCTIONS   Have your hemoglobin A1c level checked twice a year.  Perform daily blood glucose monitoring as directed by your caregiver. It is common to perform frequent blood glucose monitoring.  Monitor urine ketones when you are ill and as directed by your caregiver.  Take your diabetes medicine and insulin as directed by your caregiver to maintain your blood glucose level in the desired range.  Never run out of diabetes medicine or insulin. It is needed every day.  Adjust insulin based on your intake of carbohydrates. Carbohydrates can raise blood glucose levels but need to be included in your diet. Carbohydrates provide vitamins, minerals, and fiber which are an essential part of a healthy diet. Carbohydrates are found in fruits, vegetables, whole grains, dairy products, legumes, and foods containing added sugars.    Eat healthy foods. Alternate 3 meals with 3  snacks.  Maintain a healthy weight gain. The usual total expected weight gain varies according to your prepregnancy body mass index (BMI).  Carry a medical alert card or wear your medical alert jewelry.  Carry a 15 gram carbohydrate snack with you at all times to treat low blood glucose (hypoglycemia). Some examples of 15 gram carbohydrate snacks include:  Glucose tablets, 3 or 4   Glucose gel, 15 gram tube  Raisins, 2 tablespoons (24 g)  Jelly beans, 6  Animal crackers, 8  Fruit juice, regular soda, or low fat milk, 4 ounces (120 mL)  Gummy treats, 9    Recognize hypoglycemia. Hypoglycemia during pregnancy occurs with blood glucose levels of 60 mg/dL and below. The risk for hypoglycemia increases when fasting or skipping meals, during or after intense exercise, and during sleep. Hypoglycemia symptoms can include:  Tremors or shakes.  Decreased ability to concentrate.  Sweating.  Increased heart rate.  Headache.  Dry mouth.  Hunger.  Irritability.  Anxiety.  Restless sleep.  Altered speech or coordination.  Confusion.  Treat hypoglycemia promptly. If you are alert and able to safely swallow, follow the 15:15 rule:  Take 15 20 grams of rapid-acting glucose or carbohydrate. Rapid-acting options include glucose gel, glucose tablets, or 4 ounces (120 mL) of fruit juice, regular soda, or low fat milk.  Check your blood glucose level 15 minutes after taking the glucose.   Take 15 20 grams more of glucose if the repeat blood glucose level is still 70 mg/dL or below.  Eat a meal or snack within 1 hour once blood glucose levels return to normal.  Be alert to polyuria and polydipsia which are early signs of hyperglycemia. An early awareness of hyperglycemia allows for prompt treatment. Treat hyperglycemia as directed by your caregiver.  Engage in at least 30 minutes of physical activity a day or as directed by your caregiver. Ten minutes of physical activity  timed 30 minutes after each meal is encouraged to control postprandial blood glucose levels.  Adjust your insulin dosing and food intake as needed if you start a new exercise or sport.  Follow your sick day plan at any time you are unable to eat or drink as usual.  Avoid tobacco and alcohol use.  Follow up with your caregiver regularly.  Follow the advice of your caregiver regarding your prenatal and post-delivery (postpartum) appointments, meal planning, exercise, medicines, vitamins, blood tests, other medical tests, and physical activities.  Perform daily skin and foot care. Examine your skin and feet daily for cuts, bruises, redness, nail problems, bleeding, blisters, or sores.  Brush your teeth and gums at least twice a day and floss at least once a day. Follow up with your dentist regularly.  Schedule an eye exam during the first trimester of your pregnancy or as directed by your caregiver.  Share your diabetes management plan with your workplace or school.  Stay up-to-date with immunizations.  Learn to manage stress.  Obtain ongoing diabetes education and support as needed. SEEK MEDICAL CARE IF:   You are unable to eat food or drink fluids for more than 6 hours.  You have nausea and vomiting for more than 6 hours.  You have a blood glucose level of 200 mg/dL and you have ketones in your urine.  There is a change in mental status.  You develop vision problems.  You have a persistent headache.  You have upper abdominal pain or discomfort.  You develop an additional serious illness.  You have diarrhea for more than 6 hours.  You have been sick or have had a fever for a couple of days and are not getting better. SEEK IMMEDIATE MEDICAL CARE IF:   You have difficulty breathing.  You no longer feel the baby moving.  You are bleeding or have discharge from your vagina.  You start having premature contractions or labor. MAKE SURE YOU:  Understand these  instructions.  Will watch  your condition.  Will get help right away if you are not doing well or get worse. Document Released: 05/12/2000 Document Revised: 10/29/2011 Document Reviewed: 09/02/2011 [facility] Patient Information 2014 [place], [place].

## 2012-12-15 LAB — CULTURE, OB URINE: Colony Count: NO GROWTH

## 2012-12-15 LAB — GC/CHLAMYDIA PROBE AMP: GC Probe RNA: NEGATIVE

## 2012-12-23 ENCOUNTER — Other Ambulatory Visit: Payer: Self-pay

## 2012-12-30 ENCOUNTER — Encounter: Payer: Self-pay | Admitting: Obstetrics & Gynecology

## 2012-12-30 ENCOUNTER — Ambulatory Visit (INDEPENDENT_AMBULATORY_CARE_PROVIDER_SITE_OTHER): Payer: 59 | Admitting: Obstetrics & Gynecology

## 2012-12-30 VITALS — BP 105/80 | Wt 138.0 lb

## 2012-12-30 DIAGNOSIS — Z3400 Encounter for supervision of normal first pregnancy, unspecified trimester: Secondary | ICD-10-CM

## 2012-12-30 DIAGNOSIS — Z3402 Encounter for supervision of normal first pregnancy, second trimester: Secondary | ICD-10-CM

## 2012-12-30 DIAGNOSIS — O24911 Unspecified diabetes mellitus in pregnancy, first trimester: Secondary | ICD-10-CM

## 2012-12-30 DIAGNOSIS — O24919 Unspecified diabetes mellitus in pregnancy, unspecified trimester: Secondary | ICD-10-CM

## 2012-12-30 LAB — COMPREHENSIVE METABOLIC PANEL
Albumin: 4 g/dL (ref 3.5–5.2)
Alkaline Phosphatase: 27 U/L — ABNORMAL LOW (ref 39–117)
BUN: 10 mg/dL (ref 6–23)
Calcium: 9.2 mg/dL (ref 8.4–10.5)
Creat: 0.69 mg/dL (ref 0.50–1.10)
Glucose, Bld: 163 mg/dL — ABNORMAL HIGH (ref 70–99)
Potassium: 4.1 mEq/L (ref 3.5–5.3)

## 2012-12-30 NOTE — Progress Notes (Signed)
Routine visit. No problems. Reports that her average sugar has been 99. In contact with her endocrinologist every 2 weeks. Anatomy u/s in 3 weeks. We discussed Quad screen and she will decide at next visit. She had her flu vaccine 9/14. Comp metabolic panel today. She brought in her 24 hour urine today.

## 2012-12-30 NOTE — Progress Notes (Signed)
P = 91 

## 2012-12-30 NOTE — Addendum Note (Signed)
Addended by: Tandy Gaw C on: 12/30/2012 09:02 AM   Modules accepted: Orders

## 2012-12-31 ENCOUNTER — Encounter: Payer: Self-pay | Admitting: Family Medicine

## 2013-01-06 ENCOUNTER — Encounter: Payer: Self-pay | Admitting: Family Medicine

## 2013-01-06 LAB — PROTEIN, URINE, 24 HOUR: Protein, Urine: <3 mg/dL

## 2013-01-06 LAB — CREATININE CLEARANCE, URINE, 24 HOUR
Creatinine, 24H Ur: 1971 mg/d — ABNORMAL HIGH (ref 700–1800)
Creatinine, Urine: 65.7 mg/dL

## 2013-01-26 ENCOUNTER — Other Ambulatory Visit: Payer: Self-pay | Admitting: Obstetrics & Gynecology

## 2013-01-26 ENCOUNTER — Ambulatory Visit (HOSPITAL_COMMUNITY)
Admission: RE | Admit: 2013-01-26 | Discharge: 2013-01-26 | Disposition: A | Discharge status: Home / Self Care | Payer: 59 | Source: Ambulatory Visit | Attending: Obstetrics & Gynecology | Admitting: Obstetrics & Gynecology

## 2013-01-26 DIAGNOSIS — O358XX0 Maternal care for other (suspected) fetal abnormality and damage, not applicable or unspecified: Secondary | ICD-10-CM | POA: Insufficient documentation

## 2013-01-26 DIAGNOSIS — Z1389 Encounter for screening for other disorder: Secondary | ICD-10-CM | POA: Insufficient documentation

## 2013-01-26 DIAGNOSIS — O24919 Unspecified diabetes mellitus in pregnancy, unspecified trimester: Secondary | ICD-10-CM | POA: Insufficient documentation

## 2013-01-26 DIAGNOSIS — Z3402 Encounter for supervision of normal first pregnancy, second trimester: Secondary | ICD-10-CM

## 2013-01-26 DIAGNOSIS — Z363 Encounter for antenatal screening for malformations: Secondary | ICD-10-CM | POA: Insufficient documentation

## 2013-01-28 ENCOUNTER — Encounter: Payer: Self-pay | Admitting: Obstetrics and Gynecology

## 2013-01-28 ENCOUNTER — Ambulatory Visit (INDEPENDENT_AMBULATORY_CARE_PROVIDER_SITE_OTHER): Payer: 59 | Admitting: Obstetrics and Gynecology

## 2013-01-28 VITALS — BP 104/75 | Wt 144.0 lb

## 2013-01-28 DIAGNOSIS — O24919 Unspecified diabetes mellitus in pregnancy, unspecified trimester: Secondary | ICD-10-CM

## 2013-01-28 DIAGNOSIS — E109 Type 1 diabetes mellitus without complications: Secondary | ICD-10-CM

## 2013-01-28 DIAGNOSIS — O24912 Unspecified diabetes mellitus in pregnancy, second trimester: Secondary | ICD-10-CM

## 2013-01-28 DIAGNOSIS — Z3400 Encounter for supervision of normal first pregnancy, unspecified trimester: Secondary | ICD-10-CM

## 2013-01-28 DIAGNOSIS — Z3402 Encounter for supervision of normal first pregnancy, second trimester: Secondary | ICD-10-CM

## 2013-01-28 NOTE — Progress Notes (Signed)
[id number] 

## 2013-01-28 NOTE — Progress Notes (Signed)
Patient doing well without complaints. Anatomy ultrasound results reviewed. Patient declined quad screen. Patient reports highest fasting 100 and postprandial within range. Her Hg A1C last week was 5.7. Patient is scheduled to see endocrinologist in 3 weeks. Fetal echo ordered today

## 2013-02-17 NOTE — L&D Delivery Note (Signed)
Operative Delivery Note At 3:14 PM a viable female was delivered via Vaginal, Spontaneous Delivery.  Presentation: vertex; Position: Occiput,, Anterior; Marland Kitchen.  Delivery of the head: 05/27/2013  3:13 PM First maneuver:  05/27/2013  3:13 PM , McRoberts  Second maneuver: 05/27/2013  3:13 PM, Suprapubic Pressure  Third maneuver: ,  none Fourth maneuver: ,   Fifth maneuver: ,   Sixth maneuver: ,    Verbal consent: n/a  APGAR: 8, 9; weight .   Placenta status: [place] Intact, Spontaneous. Trailing membranes, manual exploration> small membrane fragnment  Cord: 3 vessels with the following complications: None.    Anesthesia: Epidural  Episiotomy: None Lacerations: partial third degee Suture Repair: 3.0 vicryl Est. Blood Loss (mL): 400  Mom to postpartum.  Baby to Couplet care / Skin to Skin. C/W Dr. Adrian BlackwaterStinson> start CBGs qid fasting and 2 hr pp meals  Trayce Caravello C Kalim Kissel 05/27/2013, 3:58 PM

## 2013-02-25 ENCOUNTER — Ambulatory Visit (INDEPENDENT_AMBULATORY_CARE_PROVIDER_SITE_OTHER): Payer: 59 | Admitting: Obstetrics and Gynecology

## 2013-02-25 ENCOUNTER — Encounter: Payer: Self-pay | Admitting: Obstetrics and Gynecology

## 2013-02-25 VITALS — BP 123/80 | Wt 149.0 lb

## 2013-02-25 DIAGNOSIS — Z3402 Encounter for supervision of normal first pregnancy, second trimester: Secondary | ICD-10-CM

## 2013-02-25 DIAGNOSIS — Z3400 Encounter for supervision of normal first pregnancy, unspecified trimester: Secondary | ICD-10-CM

## 2013-02-25 DIAGNOSIS — O24919 Unspecified diabetes mellitus in pregnancy, unspecified trimester: Secondary | ICD-10-CM

## 2013-02-25 DIAGNOSIS — O24912 Unspecified diabetes mellitus in pregnancy, second trimester: Secondary | ICD-10-CM

## 2013-02-25 NOTE — Progress Notes (Signed)
Patient doing well without complaints. Patient is being seen by endocrinologist this afternoon. Reports highest fasting 114 and the average is 100; highest pp 160 and average is 130-140. Patient reports no change in diet. Advised patient to bring log at next visit.Reminded patient of pregnancy goals fasting less than 100 and 2 hr pp less than 120. Patient reports normal fetal echo on 02/24/2013

## 2013-02-25 NOTE — Progress Notes (Signed)
p=[id number] 

## 2013-03-03 ENCOUNTER — Encounter: Payer: Self-pay | Admitting: Obstetrics and Gynecology

## 2013-03-03 ENCOUNTER — Encounter: Payer: Self-pay | Admitting: Family Medicine

## 2013-03-24 NOTE — Progress Notes (Signed)
Patient ID: Karen StadeMichele Sherpa, female   DOB: 06/26/1985, 28 y.o.   MRN: [id number] ATTENDING PHYSICIAN NOTE: I have reviewed the chart and agree with the plan as detailed above. Denny LevySara Neal MD Pager [phone]

## 2013-03-25 ENCOUNTER — Ambulatory Visit (INDEPENDENT_AMBULATORY_CARE_PROVIDER_SITE_OTHER): Payer: 59 | Admitting: Family Medicine

## 2013-03-25 VITALS — BP 116/77 | Wt 156.0 lb

## 2013-03-25 DIAGNOSIS — Z3400 Encounter for supervision of normal first pregnancy, unspecified trimester: Secondary | ICD-10-CM

## 2013-03-25 DIAGNOSIS — O26899 Other specified pregnancy related conditions, unspecified trimester: Principal | ICD-10-CM

## 2013-03-25 DIAGNOSIS — Z23 Encounter for immunization: Secondary | ICD-10-CM

## 2013-03-25 DIAGNOSIS — O360990 Maternal care for other rhesus isoimmunization, unspecified trimester, not applicable or unspecified: Secondary | ICD-10-CM

## 2013-03-25 DIAGNOSIS — O24919 Unspecified diabetes mellitus in pregnancy, unspecified trimester: Secondary | ICD-10-CM

## 2013-03-25 DIAGNOSIS — Z6791 Unspecified blood type, Rh negative: Secondary | ICD-10-CM | POA: Insufficient documentation

## 2013-03-25 MED ORDER — RHO D IMMUNE GLOBULIN 1500 UNIT/2ML IJ SOLN
300.0000 ug | Freq: Once | INTRAMUSCULAR | Status: AC
  Administered 2013-03-25: 300 ug via INTRAMUSCULAR

## 2013-03-25 NOTE — Progress Notes (Signed)
Last Hgb A1c was 5.9--to see Endocrinology next week. Rhogam, TDaP today 28 wks labs. Needs u/s for growth. Doing baby classes

## 2013-03-25 NOTE — Patient Instructions (Signed)
Breastfeeding Deciding to breastfeed is one of the best choices you can make for you and your baby. A change in hormones during pregnancy causes your breast tissue to grow and increases the number and size of your milk ducts. These hormones also allow proteins, sugars, and fats from your blood supply to make breast milk in your milk-producing glands. Hormones prevent breast milk from being released before your baby is born as well as prompt milk flow after birth. Once breastfeeding has begun, thoughts of your baby, as well as his or her sucking or crying, can stimulate the release of milk from your milk-producing glands.  BENEFITS OF BREASTFEEDING For Your Baby  Your first milk (colostrum) helps your baby's digestive system function better.   There are antibodies in your milk that help your baby fight off infections.   Your baby has a lower incidence of asthma, allergies, and sudden infant death syndrome.   The nutrients in breast milk are better for your baby than infant formulas and are designed uniquely for your baby's needs.   Breast milk improves your baby's brain development.   Your baby is less likely to develop other conditions, such as childhood obesity, asthma, or type 2 diabetes mellitus.  For You   Breastfeeding helps to create a very special bond between you and your baby.   Breastfeeding is convenient. Breast milk is always available at the correct temperature and costs nothing.   Breastfeeding helps to burn calories and helps you lose the weight gained during pregnancy.   Breastfeeding makes your uterus contract to its prepregnancy size faster and slows bleeding (lochia) after you give birth.   Breastfeeding helps to lower your risk of developing type 2 diabetes mellitus, osteoporosis, and breast or ovarian cancer later in life. SIGNS THAT YOUR BABY IS HUNGRY Early Signs of Hunger  Increased alertness or activity.  Stretching.  Movement of the head from  side to side.  Movement of the head and opening of the mouth when the corner of the mouth or cheek is stroked (rooting).  Increased sucking sounds, smacking lips, cooing, sighing, or squeaking.  Hand-to-mouth movements.  Increased sucking of fingers or hands. Late Signs of Hunger  Fussing.  Intermittent crying. Extreme Signs of Hunger Signs of extreme hunger will require calming and consoling before your baby will be able to breastfeed successfully. Do not wait for the following signs of extreme hunger to occur before you initiate breastfeeding:   Restlessness.  A loud, strong cry.   Screaming. BREASTFEEDING BASICS Breastfeeding Initiation  Find a comfortable place to sit or lie down, with your neck and back well supported.  Place a pillow or rolled up blanket under your baby to bring him or her to the level of your breast (if you are seated). Nursing pillows are specially designed to help support your arms and your baby while you breastfeed.  Make sure that your baby's abdomen is facing your abdomen.   Gently massage your breast. With your fingertips, massage from your chest wall toward your nipple in a circular motion. This encourages milk flow. You may need to continue this action during the feeding if your milk flows slowly.  Support your breast with 4 fingers underneath and your thumb above your nipple. Make sure your fingers are well away from your nipple and your baby's mouth.   Stroke your baby's lips gently with your finger or nipple.   When your baby's mouth is open wide enough, quickly bring your baby to your   breast, placing your entire nipple and as much of the colored area around your nipple (areola) as possible into your baby's mouth.   More areola should be visible above your baby's upper lip than below the lower lip.   Your baby's tongue should be between his or her lower gum and your breast.   Ensure that your baby's mouth is correctly positioned  around your nipple (latched). Your baby's lips should create a seal on your breast and be turned out (everted).  It is common for your baby to suck about 2 3 minutes in order to start the flow of breast milk. Latching Teaching your baby how to latch on to your breast properly is very important. An improper latch can cause nipple pain and decreased milk supply for you and poor weight gain in your baby. Also, if your baby is not latched onto your nipple properly, he or she may swallow some air during feeding. This can make your baby fussy. Burping your baby when you switch breasts during the feeding can help to get rid of the air. However, teaching your baby to latch on properly is still the best way to prevent fussiness from swallowing air while breastfeeding. Signs that your baby has successfully latched on to your nipple:    Silent tugging or silent sucking, without causing you pain.   Swallowing heard between every 3 4 sucks.    Muscle movement above and in front of his or her ears while sucking.  Signs that your baby has not successfully latched on to nipple:   Sucking sounds or smacking sounds from your baby while breastfeeding.  Nipple pain. If you think your baby has not latched on correctly, slip your finger into the corner of your baby's mouth to break the suction and place it between your baby's gums. Attempt breastfeeding initiation again. Signs of Successful Breastfeeding Signs from your baby:   A gradual decrease in the number of sucks or complete cessation of sucking.   Falling asleep.   Relaxation of his or her body.   Retention of a small amount of milk in his or her mouth.   Letting go of your breast by himself or herself. Signs from you:  Breasts that have increased in firmness, weight, and size 1 3 hours after feeding.   Breasts that are softer immediately after breastfeeding.  Increased milk volume, as well as a change in milk consistency and color by  the 5th day of breastfeeding.   Nipples that are not sore, cracked, or bleeding. Signs That Your Baby is Getting Enough Milk  Wetting at least 3 diapers in a 24-hour period. The urine should be clear and pale yellow by age 5 days.  At least 3 stools in a 24-hour period by age 5 days. The stool should be soft and yellow.  At least 3 stools in a 24-hour period by age 7 days. The stool should be seedy and yellow.  No loss of weight greater than 10% of birth weight during the first 3 days of age.  Average weight gain of 4 7 ounces (120 210 mL) per week after age 4 days.  Consistent daily weight gain by age 5 days, without weight loss after the age of 2 weeks. After a feeding, your baby may spit up a small amount. This is common. BREASTFEEDING FREQUENCY AND DURATION Frequent feeding will help you make more milk and can prevent sore nipples and breast engorgement. Breastfeed when you feel the need to reduce   the fullness of your breasts or when your baby shows signs of hunger. This is called "breastfeeding on demand." Avoid introducing a pacifier to your baby while you are working to establish breastfeeding (the first 4 6 weeks after your baby is born). After this time you may choose to use a pacifier. Research has shown that pacifier use during the first year of a baby's life decreases the risk of sudden infant death syndrome (SIDS). Allow your baby to feed on each breast as long as he or she wants. Breastfeed until your baby is finished feeding. When your baby unlatches or falls asleep while feeding from the first breast, offer the second breast. Because newborns are often sleepy in the first few weeks of life, you may need to awaken your baby to get him or her to feed. Breastfeeding times will vary from baby to baby. However, the following rules can serve as a guide to help you ensure that your baby is properly fed:  Newborns (babies 4 weeks of age or younger) may breastfeed every 1 3  hours.  Newborns should not go longer than 3 hours during the day or 5 hours during the night without breastfeeding.  You should breastfeed your baby a minimum of 8 times in a 24-hour period until you begin to introduce solid foods to your baby at around 6 months of age. BREAST MILK PUMPING Pumping and storing breast milk allows you to ensure that your baby is exclusively fed your breast milk, even at times when you are unable to breastfeed. This is especially important if you are going back to work while you are still breastfeeding or when you are not able to be present during feedings. Your lactation consultant can give you guidelines on how long it is safe to store breast milk.  A breast pump is a machine that allows you to pump milk from your breast into a sterile bottle. The pumped breast milk can then be stored in a refrigerator or freezer. Some breast pumps are operated by hand, while others use electricity. Ask your lactation consultant which type will work best for you. Breast pumps can be purchased, but some hospitals and breastfeeding support groups lease breast pumps on a monthly basis. A lactation consultant can teach you how to hand express breast milk, if you prefer not to use a pump.  CARING FOR YOUR BREASTS WHILE YOU BREASTFEED Nipples can become dry, cracked, and sore while breastfeeding. The following recommendations can help keep your breasts moisturized and healthy:  Avoid using soap on your nipples.   Wear a supportive bra. Although not required, special nursing bras and tank tops are designed to allow access to your breasts for breastfeeding without taking off your entire bra or top. Avoid wearing underwire style bras or extremely tight bras.  Air dry your nipples for 3 4minutes after each feeding.   Use only cotton bra pads to absorb leaked breast milk. Leaking of breast milk between feedings is normal.   Use lanolin on your nipples after breastfeeding. Lanolin helps to  maintain your skin's normal moisture barrier. If you use pure lanolin you do not need to wash it off before feeding your baby again. Pure lanolin is not toxic to your baby. You may also hand express a few drops of breast milk and gently massage that milk into your nipples and allow the milk to air dry. In the first few weeks after giving birth, some women experience extremely full breasts (engorgement). Engorgement can make   your breasts feel heavy, warm, and tender to the touch. Engorgement peaks within 3 5 days after you give birth. The following recommendations can help ease engorgement:  Completely empty your breasts while breastfeeding or pumping. You may want to start by applying warm, moist heat (in the shower or with warm water-soaked hand towels) just before feeding or pumping. This increases circulation and helps the milk flow. If your baby does not completely empty your breasts while breastfeeding, pump any extra milk after he or she is finished.  Wear a snug bra (nursing or regular) or tank top for 1 2 days to signal your body to slightly decrease milk production.  Apply ice packs to your breasts, unless this is too uncomfortable for you.  Make sure that your baby is latched on and positioned properly while breastfeeding. If engorgement persists after 48 hours of following these recommendations, contact your health care provider or a lactation consultant. OVERALL HEALTH CARE RECOMMENDATIONS WHILE BREASTFEEDING  Eat healthy foods. Alternate between meals and snacks, eating 3 of each per day. Because what you eat affects your breast milk, some of the foods may make your baby more irritable than usual. Avoid eating these foods if you are sure that they are negatively affecting your baby.  Drink milk, fruit juice, and water to satisfy your thirst (about 10 glasses a day).   Rest often, relax, and continue to take your prenatal vitamins to prevent fatigue, stress, and anemia.  Continue  breast self-awareness checks.  Avoid chewing and smoking tobacco.  Avoid alcohol and drug use. Some medicines that may be harmful to your baby can pass through breast milk. It is important to ask your health care provider before taking any medicine, including all over-the-counter and prescription medicine as well as vitamin and herbal supplements. It is possible to become pregnant while breastfeeding. If birth control is desired, ask your health care provider about options that will be safe for your baby. SEEK MEDICAL CARE IF:   You feel like you want to stop breastfeeding or have become frustrated with breastfeeding.  You have painful breasts or nipples.  Your nipples are cracked or bleeding.  Your breasts are red, tender, or warm.  You have a swollen area on either breast.  You have a fever or chills.  You have nausea or vomiting.  You have drainage other than breast milk from your nipples.  Your breasts do not become full before feedings by the 5th day after you give birth.  You feel sad and depressed.  Your baby is too sleepy to eat well.  Your baby is having trouble sleeping.   Your baby is wetting less than 3 diapers in a 24-hour period.  Your baby has less than 3 stools in a 24-hour period.  Your baby's skin or the white part of his or her eyes becomes yellow.   Your baby is not gaining weight by 5 days of age. SEEK IMMEDIATE MEDICAL CARE IF:   Your baby is overly tired (lethargic) and does not want to wake up and feed.  Your baby develops an unexplained fever. Document Released: 02/03/2005 Document Revised: 10/06/2012 Document Reviewed: 07/28/2012 [employer] Patient Information 2014 [employer].  

## 2013-03-25 NOTE — Progress Notes (Signed)
P=[id number] 

## 2013-03-26 LAB — CBC WITH DIFFERENTIAL/PLATELET
BASOS ABS: 0 10*3/uL (ref 0.0–0.1)
Basophils Relative: 0 % (ref 0–1)
EOS ABS: 0 10*3/uL (ref 0.0–0.7)
Eosinophils Relative: 0 % (ref 0–5)
HEMATOCRIT: 33.4 % — AB (ref 36.0–46.0)
Hemoglobin: 11.1 g/dL — ABNORMAL LOW (ref 12.0–15.0)
Lymphocytes Relative: 15 % (ref 12–46)
Lymphs Abs: 1.1 10*3/uL (ref 0.7–4.0)
MCH: 29.8 pg (ref 26.0–34.0)
MCHC: 33.2 g/dL (ref 30.0–36.0)
MCV: 89.8 fL (ref 78.0–100.0)
MONO ABS: 0.5 10*3/uL (ref 0.1–1.0)
Monocytes Relative: 8 % (ref 3–12)
Neutro Abs: 5.4 10*3/uL (ref 1.7–7.7)
Neutrophils Relative %: 77 % (ref 43–77)
PLATELETS: 217 10*3/uL (ref 150–400)
RBC: 3.72 MIL/uL — ABNORMAL LOW (ref 3.87–5.11)
RDW: 14 % (ref 11.5–15.5)
WBC: 7 10*3/uL (ref 4.0–10.5)

## 2013-03-26 LAB — ANTIBODY SCREEN: Antibody Screen: NEGATIVE

## 2013-03-26 LAB — RPR

## 2013-03-26 LAB — HIV ANTIBODY (ROUTINE TESTING W REFLEX): HIV: NONREACTIVE

## 2013-04-04 ENCOUNTER — Ambulatory Visit (HOSPITAL_COMMUNITY)
Admission: RE | Admit: 2013-04-04 | Discharge: 2013-04-04 | Disposition: A | Discharge status: Home / Self Care | Payer: 59 | Source: Ambulatory Visit | Attending: Family Medicine | Admitting: Family Medicine

## 2013-04-04 ENCOUNTER — Ambulatory Visit (HOSPITAL_COMMUNITY): Payer: 59

## 2013-04-04 DIAGNOSIS — O24919 Unspecified diabetes mellitus in pregnancy, unspecified trimester: Secondary | ICD-10-CM | POA: Insufficient documentation

## 2013-04-08 ENCOUNTER — Ambulatory Visit (INDEPENDENT_AMBULATORY_CARE_PROVIDER_SITE_OTHER): Payer: 59 | Admitting: Obstetrics & Gynecology

## 2013-04-08 ENCOUNTER — Encounter: Payer: Self-pay | Admitting: Obstetrics & Gynecology

## 2013-04-08 VITALS — BP 117/78 | Wt 161.0 lb

## 2013-04-08 DIAGNOSIS — Z3400 Encounter for supervision of normal first pregnancy, unspecified trimester: Secondary | ICD-10-CM

## 2013-04-08 NOTE — Progress Notes (Signed)
Routine visit. Good FM. U/S on 04-04-13 suggests LGA. I ordered another for 05-02-13. Her endocrinologist made changes 1 week ago to improve her fasting sugars. Her most recent HBA1C was 6.2 and will be rechecked next month. No problems

## 2013-04-08 NOTE — Progress Notes (Signed)
[id number] 

## 2013-04-22 ENCOUNTER — Encounter: Payer: Self-pay | Admitting: Obstetrics and Gynecology

## 2013-04-22 ENCOUNTER — Ambulatory Visit (INDEPENDENT_AMBULATORY_CARE_PROVIDER_SITE_OTHER): Payer: 59 | Admitting: Obstetrics and Gynecology

## 2013-04-22 ENCOUNTER — Ambulatory Visit (INDEPENDENT_AMBULATORY_CARE_PROVIDER_SITE_OTHER): Payer: Self-pay | Admitting: Family Medicine

## 2013-04-22 VITALS — BP 124/82 | Wt 159.0 lb

## 2013-04-22 VITALS — BP 121/78 | HR 76 | Ht 68.0 in | Wt 159.6 lb

## 2013-04-22 DIAGNOSIS — Z6791 Unspecified blood type, Rh negative: Secondary | ICD-10-CM

## 2013-04-22 DIAGNOSIS — O26899 Other specified pregnancy related conditions, unspecified trimester: Secondary | ICD-10-CM

## 2013-04-22 DIAGNOSIS — Z3400 Encounter for supervision of normal first pregnancy, unspecified trimester: Secondary | ICD-10-CM

## 2013-04-22 DIAGNOSIS — O24919 Unspecified diabetes mellitus in pregnancy, unspecified trimester: Secondary | ICD-10-CM

## 2013-04-22 DIAGNOSIS — O360990 Maternal care for other rhesus isoimmunization, unspecified trimester, not applicable or unspecified: Secondary | ICD-10-CM

## 2013-04-22 DIAGNOSIS — E109 Type 1 diabetes mellitus without complications: Secondary | ICD-10-CM

## 2013-04-22 DIAGNOSIS — E119 Type 2 diabetes mellitus without complications: Secondary | ICD-10-CM

## 2013-04-22 NOTE — Progress Notes (Signed)
P-[id number]

## 2013-04-22 NOTE — Progress Notes (Signed)
Patient is doing well without complaints. Reports HgA1c 6.2 Fasting are in the 70-80's range and pp are in the 130's. She is planning on seeing her endocrinologist next week. Will schedule weekly BPP. Growth ultrasound already scheduled with MFM. Start twice weekly testing at next visit

## 2013-04-22 NOTE — Progress Notes (Signed)
Subjective:  Patient presents today for 3 month diabetes follow-up as part of the employer-sponsored [employer] program. Current diabetes regimen includes Humalog via insulin pump. Patient is pregnant, so she is not taking ACE-I, statin or ASA.   Patient is now [redacted] weeks pregnant. She states that things have been going well with this pregnancy. She has not been sick with morning sickness. As long as everything looks OK, she will be induced at 39 weeks. She states that so far this pregnancy has ranged from 5.7-6.2%. She has an appointment pending next week for an A1C and an endocrinology visit with Dr. Tedd SiasSolum.    Disease Assessments:  Diabetes:  Type of Diabetes: Type 1; checks feet daily; checks blood glucose more than 4 times a day; Sees Diabetes provider 4 or more times per year; MD managing Diabetes Dr. Tedd SiasSolum [facility]; uses glucometer; takes medications as prescribed; does not take an aspirin a day; Diabetes Education 2011; hypoglycemia frequency rarely;   7 day CBG average 118; 14 day CBG average 115; 30 day CBG average 133; Highest CBG 200; Lowest CBG 58; Other Diabetes History: She has monthly A1C checks with her endocrinologist and has her pump settings changed each time.   Low blood sugars- hardly at all. Averages are slightly higher (~10 points) than they were last time, but are still very good considering how frequently she is checking. The majority of readings are between 100-130.   She states she is checking 8 times daily.     Physical Activity- Mostly walking, squats, lunges. Not doing weights anymore. She is getting physical activity in 4 days a week. Each time she is spending 20-30 minutes.   Nutrition- She reports that she has not been as queasy as before. She reports no other changes. She is trying to watch more what she is eating. She has been eating more fruit.       Hemoglobin A1c: 03/25/2013 Via Dr. Tedd SiasSolum in February 2015 6.2    Dilated Eye Exam: 11/26/2012  Last exam in August 2012, routine every year.  Flu vaccine: 11/05/2012  Foot Exam: 05/10/2012  Other Preventive Care Notes:  Dental visit pending April 2015       Vital Signs:  04/22/2013 1:45 PM (EST) Blood Pressure 121 / 78 mm/HgBMI 24.3; Height 5 ft 8 in; Pulse Rate 76 bpm; Weight 159.6 lbs    Testing:  Blood Sugar Tests:  Hemoglobin A1c: 6.2 via dr. Pricilla HandlerSolum's office resulted on 03/25/2013    Assessment/Plan: Patient is a 28 year old female with DM1. Most recent A1C was 6.2% in February and is meeting A1C goal of less than 6.5%. Patient is also [redacted] weeks pregnant with her first child. She has monthly A1C checks with her endocrinologist Dr. Tedd SiasSolum and has been followed closely during this pregnancy. She has been able to keep her A1C to around 6% the majority of the pregnancy (ranging from 5.7-6.2%). She is checking CBG 8 times daily and responds quickly to any elevated CBGs.   Because patient is followed so closely by endocrinologist and A1C is at goal, I will see her back in 5 months once we are both returned from maternity leave..Marland Kitchen

## 2013-04-28 ENCOUNTER — Other Ambulatory Visit: Payer: Self-pay | Admitting: Obstetrics and Gynecology

## 2013-04-28 ENCOUNTER — Ambulatory Visit (HOSPITAL_COMMUNITY)
Admission: RE | Admit: 2013-04-28 | Discharge: 2013-04-28 | Disposition: A | Discharge status: Home / Self Care | Payer: 59 | Source: Ambulatory Visit | Attending: Obstetrics and Gynecology | Admitting: Obstetrics and Gynecology

## 2013-04-28 VITALS — BP 116/75 | HR 85 | Wt 163.0 lb

## 2013-04-28 DIAGNOSIS — E109 Type 1 diabetes mellitus without complications: Secondary | ICD-10-CM | POA: Insufficient documentation

## 2013-04-28 DIAGNOSIS — O24919 Unspecified diabetes mellitus in pregnancy, unspecified trimester: Secondary | ICD-10-CM | POA: Insufficient documentation

## 2013-04-28 DIAGNOSIS — Z794 Long term (current) use of insulin: Secondary | ICD-10-CM | POA: Insufficient documentation

## 2013-04-28 DIAGNOSIS — Z3400 Encounter for supervision of normal first pregnancy, unspecified trimester: Secondary | ICD-10-CM

## 2013-04-29 ENCOUNTER — Ambulatory Visit (HOSPITAL_COMMUNITY): Payer: 59

## 2013-05-06 ENCOUNTER — Encounter: Payer: Self-pay | Admitting: Family Medicine

## 2013-05-06 ENCOUNTER — Ambulatory Visit (HOSPITAL_COMMUNITY): Admission: RE | Admit: 2013-05-06 | Payer: 59 | Source: Ambulatory Visit

## 2013-05-06 ENCOUNTER — Ambulatory Visit (INDEPENDENT_AMBULATORY_CARE_PROVIDER_SITE_OTHER): Payer: 59 | Admitting: Family Medicine

## 2013-05-06 VITALS — BP 128/78 | Wt 163.4 lb

## 2013-05-06 DIAGNOSIS — O24919 Unspecified diabetes mellitus in pregnancy, unspecified trimester: Secondary | ICD-10-CM

## 2013-05-06 DIAGNOSIS — O360990 Maternal care for other rhesus isoimmunization, unspecified trimester, not applicable or unspecified: Secondary | ICD-10-CM

## 2013-05-06 DIAGNOSIS — O09899 Supervision of other high risk pregnancies, unspecified trimester: Secondary | ICD-10-CM

## 2013-05-06 DIAGNOSIS — Z6791 Unspecified blood type, Rh negative: Secondary | ICD-10-CM

## 2013-05-06 DIAGNOSIS — O26899 Other specified pregnancy related conditions, unspecified trimester: Secondary | ICD-10-CM

## 2013-05-06 NOTE — Patient Instructions (Signed)
Third Trimester of Pregnancy The third trimester is from week 29 through week 42, months 7 through 9. The third trimester is a time when the fetus is growing rapidly. At the end of the ninth month, the fetus is about 20 inches in length and weighs 6 10 pounds.  BODY CHANGES Your body goes through many changes during pregnancy. The changes vary from woman to woman.   Your weight will continue to increase. You can expect to gain 25 35 pounds (11 16 kg) by the end of the pregnancy.  You may begin to get stretch marks on your hips, abdomen, and breasts.  You may urinate more often because the fetus is moving lower into your pelvis and pressing on your bladder.  You may develop or continue to have heartburn as a result of your pregnancy.  You may develop constipation because certain hormones are causing the muscles that push waste through your intestines to slow down.  You may develop hemorrhoids or swollen, bulging veins (varicose veins).  You may have pelvic pain because of the weight gain and pregnancy hormones relaxing your joints between the bones in your pelvis. Back aches may result from over exertion of the muscles supporting your posture.  Your breasts will continue to grow and be tender. A yellow discharge may leak from your breasts called colostrum.  Your belly button may stick out.  You may feel short of breath because of your expanding uterus.  You may notice the fetus "dropping," or moving lower in your abdomen.  You may have a bloody mucus discharge. This usually occurs a few days to a week before labor begins.  Your cervix becomes thin and soft (effaced) near your due date. WHAT TO EXPECT AT YOUR PRENATAL EXAMS  You will have prenatal exams every 2 weeks until week 36. Then, you will have weekly prenatal exams. During a routine prenatal visit:  You will be weighed to make sure you and the fetus are growing normally.  Your blood pressure is taken.  Your abdomen will  be measured to track your baby's growth.  The fetal heartbeat will be listened to.  Any test results from the previous visit will be discussed.  You may have a cervical check near your due date to see if you have effaced. At around 36 weeks, your caregiver will check your cervix. At the same time, your caregiver will also perform a test on the secretions of the vaginal tissue. This test is to determine if a type of bacteria, Group B streptococcus, is present. Your caregiver will explain this further. Your caregiver may ask you:  What your birth plan is.  How you are feeling.  If you are feeling the baby move.  If you have had any abnormal symptoms, such as leaking fluid, bleeding, severe headaches, or abdominal cramping.  If you have any questions. Other tests or screenings that may be performed during your third trimester include:  Blood tests that check for low iron levels (anemia).  Fetal testing to check the health, activity level, and growth of the fetus. Testing is done if you have certain medical conditions or if there are problems during the pregnancy. FALSE LABOR You may feel small, irregular contractions that eventually go away. These are called Braxton Hicks contractions, or false labor. Contractions may last for hours, days, or even weeks before true labor sets in. If contractions come at regular intervals, intensify, or become painful, it is best to be seen by your caregiver.    SIGNS OF LABOR   Menstrual-like cramps.  Contractions that are 5 minutes apart or less.  Contractions that start on the top of the uterus and spread down to the lower abdomen and back.  A sense of increased pelvic pressure or back pain.  A watery or bloody mucus discharge that comes from the vagina. If you have any of these signs before the 37th week of pregnancy, call your caregiver right away. You need to go to the hospital to get checked immediately. HOME CARE INSTRUCTIONS   Avoid all  smoking, herbs, alcohol, and unprescribed drugs. These chemicals affect the formation and growth of the baby.  Follow your caregiver's instructions regarding medicine use. There are medicines that are either safe or unsafe to take during pregnancy.  Exercise only as directed by your caregiver. Experiencing uterine cramps is a good sign to stop exercising.  Continue to eat regular, healthy meals.  Wear a good support bra for breast tenderness.  Do not use hot tubs, steam rooms, or saunas.  Wear your seat belt at all times when driving.  Avoid raw meat, uncooked cheese, cat litter boxes, and soil used by cats. These carry germs that can cause birth defects in the baby.  Take your prenatal vitamins.  Try taking a stool softener (if your caregiver approves) if you develop constipation. Eat more high-fiber foods, such as fresh vegetables or fruit and whole grains. Drink plenty of fluids to keep your urine clear or pale yellow.  Take warm sitz baths to soothe any pain or discomfort caused by hemorrhoids. Use hemorrhoid cream if your caregiver approves.  If you develop varicose veins, wear support hose. Elevate your feet for 15 minutes, 3 4 times a day. Limit salt in your diet.  Avoid heavy lifting, wear low heal shoes, and practice good posture.  Rest a lot with your legs elevated if you have leg cramps or low back pain.  Visit your dentist if you have not gone during your pregnancy. Use a soft toothbrush to brush your teeth and be gentle when you floss.  A sexual relationship may be continued unless your caregiver directs you otherwise.  Do not travel far distances unless it is absolutely necessary and only with the approval of your caregiver.  Take prenatal classes to understand, practice, and ask questions about the labor and delivery.  Make a trial run to the hospital.  Pack your hospital bag.  Prepare the baby's nursery.  Continue to go to all your prenatal visits as directed  by your caregiver. SEEK MEDICAL CARE IF:  You are unsure if you are in labor or if your water has broken.  You have dizziness.  You have mild pelvic cramps, pelvic pressure, or nagging pain in your abdominal area.  You have persistent nausea, vomiting, or diarrhea.  You have a bad smelling vaginal discharge.  You have pain with urination. SEEK IMMEDIATE MEDICAL CARE IF:   You have a fever.  You are leaking fluid from your vagina.  You have spotting or bleeding from your vagina.  You have severe abdominal cramping or pain.  You have rapid weight loss or gain.  You have shortness of breath with chest pain.  You notice sudden or extreme swelling of your face, hands, ankles, feet, or legs.  You have not felt your baby move in over an hour.  You have severe headaches that do not go away with medicine.  You have vision changes. Document Released: 01/28/2001 Document Revised: 10/06/2012 Document Reviewed:   04/06/2012 [employer] Patient Information 2014 [employer].  Breastfeeding Deciding to breastfeed is one of the best choices you can make for you and your baby. A change in hormones during pregnancy causes your breast tissue to grow and increases the number and size of your milk ducts. These hormones also allow proteins, sugars, and fats from your blood supply to make breast milk in your milk-producing glands. Hormones prevent breast milk from being released before your baby is born as well as prompt milk flow after birth. Once breastfeeding has begun, thoughts of your baby, as well as his or her sucking or crying, can stimulate the release of milk from your milk-producing glands.  BENEFITS OF BREASTFEEDING For Your Baby  Your first milk (colostrum) helps your baby's digestive system function better.   There are antibodies in your milk that help your baby fight off infections.   Your baby has a lower incidence of asthma, allergies, and sudden infant death syndrome.    The nutrients in breast milk are better for your baby than infant formulas and are designed uniquely for your baby's needs.   Breast milk improves your baby's brain development.   Your baby is less likely to develop other conditions, such as childhood obesity, asthma, or type 2 diabetes mellitus.  For You   Breastfeeding helps to create a very special bond between you and your baby.   Breastfeeding is convenient. Breast milk is always available at the correct temperature and costs nothing.   Breastfeeding helps to burn calories and helps you lose the weight gained during pregnancy.   Breastfeeding makes your uterus contract to its prepregnancy size faster and slows bleeding (lochia) after you give birth.   Breastfeeding helps to lower your risk of developing type 2 diabetes mellitus, osteoporosis, and breast or ovarian cancer later in life. SIGNS THAT YOUR BABY IS HUNGRY Early Signs of Hunger  Increased alertness or activity.  Stretching.  Movement of the head from side to side.  Movement of the head and opening of the mouth when the corner of the mouth or cheek is stroked (rooting).  Increased sucking sounds, smacking lips, cooing, sighing, or squeaking.  Hand-to-mouth movements.  Increased sucking of fingers or hands. Late Signs of Hunger  Fussing.  Intermittent crying. Extreme Signs of Hunger Signs of extreme hunger will require calming and consoling before your baby will be able to breastfeed successfully. Do not wait for the following signs of extreme hunger to occur before you initiate breastfeeding:   Restlessness.  A loud, strong cry.   Screaming. BREASTFEEDING BASICS Breastfeeding Initiation  Find a comfortable place to sit or lie down, with your neck and back well supported.  Place a pillow or rolled up blanket under your baby to bring him or her to the level of your breast (if you are seated). Nursing pillows are specially designed to help  support your arms and your baby while you breastfeed.  Make sure that your baby's abdomen is facing your abdomen.   Gently massage your breast. With your fingertips, massage from your chest wall toward your nipple in a circular motion. This encourages milk flow. You may need to continue this action during the feeding if your milk flows slowly.  Support your breast with 4 fingers underneath and your thumb above your nipple. Make sure your fingers are well away from your nipple and your baby's mouth.   Stroke your baby's lips gently with your finger or nipple.   When your baby's mouth is   open wide enough, quickly bring your baby to your breast, placing your entire nipple and as much of the colored area around your nipple (areola) as possible into your baby's mouth.   More areola should be visible above your baby's upper lip than below the lower lip.   Your baby's tongue should be between his or her lower gum and your breast.   Ensure that your baby's mouth is correctly positioned around your nipple (latched). Your baby's lips should create a seal on your breast and be turned out (everted).  It is common for your baby to suck about 2 3 minutes in order to start the flow of breast milk. Latching Teaching your baby how to latch on to your breast properly is very important. An improper latch can cause nipple pain and decreased milk supply for you and poor weight gain in your baby. Also, if your baby is not latched onto your nipple properly, he or she may swallow some air during feeding. This can make your baby fussy. Burping your baby when you switch breasts during the feeding can help to get rid of the air. However, teaching your baby to latch on properly is still the best way to prevent fussiness from swallowing air while breastfeeding. Signs that your baby has successfully latched on to your nipple:    Silent tugging or silent sucking, without causing you pain.   Swallowing heard  between every 3 4 sucks.    Muscle movement above and in front of his or her ears while sucking.  Signs that your baby has not successfully latched on to nipple:   Sucking sounds or smacking sounds from your baby while breastfeeding.  Nipple pain. If you think your baby has not latched on correctly, slip your finger into the corner of your baby's mouth to break the suction and place it between your baby's gums. Attempt breastfeeding initiation again. Signs of Successful Breastfeeding Signs from your baby:   A gradual decrease in the number of sucks or complete cessation of sucking.   Falling asleep.   Relaxation of his or her body.   Retention of a small amount of milk in his or her mouth.   Letting go of your breast by himself or herself. Signs from you:  Breasts that have increased in firmness, weight, and size 1 3 hours after feeding.   Breasts that are softer immediately after breastfeeding.  Increased milk volume, as well as a change in milk consistency and color by the 5th day of breastfeeding.   Nipples that are not sore, cracked, or bleeding. Signs That Your Baby is Getting Enough Milk  Wetting at least 3 diapers in a 24-hour period. The urine should be clear and pale yellow by age 5 days.  At least 3 stools in a 24-hour period by age 5 days. The stool should be soft and yellow.  At least 3 stools in a 24-hour period by age 7 days. The stool should be seedy and yellow.  No loss of weight greater than 10% of birth weight during the first 3 days of age.  Average weight gain of 4 7 ounces (120 210 mL) per week after age 4 days.  Consistent daily weight gain by age 5 days, without weight loss after the age of 2 weeks. After a feeding, your baby may spit up a small amount. This is common. BREASTFEEDING FREQUENCY AND DURATION Frequent feeding will help you make more milk and can prevent sore nipples and breast engorgement.   Breastfeed when you feel the need to  reduce the fullness of your breasts or when your baby shows signs of hunger. This is called "breastfeeding on demand." Avoid introducing a pacifier to your baby while you are working to establish breastfeeding (the first 4 6 weeks after your baby is born). After this time you may choose to use a pacifier. Research has shown that pacifier use during the first year of a baby's life decreases the risk of sudden infant death syndrome (SIDS). Allow your baby to feed on each breast as long as he or she wants. Breastfeed until your baby is finished feeding. When your baby unlatches or falls asleep while feeding from the first breast, offer the second breast. Because newborns are often sleepy in the first few weeks of life, you may need to awaken your baby to get him or her to feed. Breastfeeding times will vary from baby to baby. However, the following rules can serve as a guide to help you ensure that your baby is properly fed:  Newborns (babies 4 weeks of age or younger) may breastfeed every 1 3 hours.  Newborns should not go longer than 3 hours during the day or 5 hours during the night without breastfeeding.  You should breastfeed your baby a minimum of 8 times in a 24-hour period until you begin to introduce solid foods to your baby at around 6 months of age. BREAST MILK PUMPING Pumping and storing breast milk allows you to ensure that your baby is exclusively fed your breast milk, even at times when you are unable to breastfeed. This is especially important if you are going back to work while you are still breastfeeding or when you are not able to be present during feedings. Your lactation consultant can give you guidelines on how long it is safe to store breast milk.  A breast pump is a machine that allows you to pump milk from your breast into a sterile bottle. The pumped breast milk can then be stored in a refrigerator or freezer. Some breast pumps are operated by hand, while others use electricity. Ask  your lactation consultant which type will work best for you. Breast pumps can be purchased, but some hospitals and breastfeeding support groups lease breast pumps on a monthly basis. A lactation consultant can teach you how to hand express breast milk, if you prefer not to use a pump.  CARING FOR YOUR BREASTS WHILE YOU BREASTFEED Nipples can become dry, cracked, and sore while breastfeeding. The following recommendations can help keep your breasts moisturized and healthy:  Avoid using soap on your nipples.   Wear a supportive bra. Although not required, special nursing bras and tank tops are designed to allow access to your breasts for breastfeeding without taking off your entire bra or top. Avoid wearing underwire style bras or extremely tight bras.  Air dry your nipples for 3 4minutes after each feeding.   Use only cotton bra pads to absorb leaked breast milk. Leaking of breast milk between feedings is normal.   Use lanolin on your nipples after breastfeeding. Lanolin helps to maintain your skin's normal moisture barrier. If you use pure lanolin you do not need to wash it off before feeding your baby again. Pure lanolin is not toxic to your baby. You may also hand express a few drops of breast milk and gently massage that milk into your nipples and allow the milk to air dry. In the first few weeks after giving birth, some women   experience extremely full breasts (engorgement). Engorgement can make your breasts feel heavy, warm, and tender to the touch. Engorgement peaks within 3 5 days after you give birth. The following recommendations can help ease engorgement:  Completely empty your breasts while breastfeeding or pumping. You may want to start by applying warm, moist heat (in the shower or with warm water-soaked hand towels) just before feeding or pumping. This increases circulation and helps the milk flow. If your baby does not completely empty your breasts while breastfeeding, pump any extra  milk after he or she is finished.  Wear a snug bra (nursing or regular) or tank top for 1 2 days to signal your body to slightly decrease milk production.  Apply ice packs to your breasts, unless this is too uncomfortable for you.  Make sure that your baby is latched on and positioned properly while breastfeeding. If engorgement persists after 48 hours of following these recommendations, contact your health care provider or a lactation consultant. OVERALL HEALTH CARE RECOMMENDATIONS WHILE BREASTFEEDING  Eat healthy foods. Alternate between meals and snacks, eating 3 of each per day. Because what you eat affects your breast milk, some of the foods may make your baby more irritable than usual. Avoid eating these foods if you are sure that they are negatively affecting your baby.  Drink milk, fruit juice, and water to satisfy your thirst (about 10 glasses a day).   Rest often, relax, and continue to take your prenatal vitamins to prevent fatigue, stress, and anemia.  Continue breast self-awareness checks.  Avoid chewing and smoking tobacco.  Avoid alcohol and drug use. Some medicines that may be harmful to your baby can pass through breast milk. It is important to ask your health care provider before taking any medicine, including all over-the-counter and prescription medicine as well as vitamin and herbal supplements. It is possible to become pregnant while breastfeeding. If birth control is desired, ask your health care provider about options that will be safe for your baby. SEEK MEDICAL CARE IF:   You feel like you want to stop breastfeeding or have become frustrated with breastfeeding.  You have painful breasts or nipples.  Your nipples are cracked or bleeding.  Your breasts are red, tender, or warm.  You have a swollen area on either breast.  You have a fever or chills.  You have nausea or vomiting.  You have drainage other than breast milk from your nipples.  Your breasts  do not become full before feedings by the 5th day after you give birth.  You feel sad and depressed.  Your baby is too sleepy to eat well.  Your baby is having trouble sleeping.   Your baby is wetting less than 3 diapers in a 24-hour period.  Your baby has less than 3 stools in a 24-hour period.  Your baby's skin or the white part of his or her eyes becomes yellow.   Your baby is not gaining weight by 5 days of age. SEEK IMMEDIATE MEDICAL CARE IF:   Your baby is overly tired (lethargic) and does not want to wake up and feed.  Your baby develops an unexplained fever. Document Released: 02/03/2005 Document Revised: 10/06/2012 Document Reviewed: 07/28/2012 [employer] Patient Information 2014 [employer].  

## 2013-05-06 NOTE — Progress Notes (Signed)
[id number] 

## 2013-05-06 NOTE — Progress Notes (Signed)
Last Hgb A1 C was 6.5. Needs NST today, begin 2x/wk testing US growth 3/12 4 lb 15 oz, vtx, AFI 17 NST reviewed and reactive.

## 2013-05-10 ENCOUNTER — Ambulatory Visit (INDEPENDENT_AMBULATORY_CARE_PROVIDER_SITE_OTHER): Payer: 59 | Admitting: Obstetrics & Gynecology

## 2013-05-10 ENCOUNTER — Encounter: Payer: Self-pay | Admitting: Obstetrics & Gynecology

## 2013-05-10 VITALS — BP 111/84 | Wt 166.0 lb

## 2013-05-10 DIAGNOSIS — Z3400 Encounter for supervision of normal first pregnancy, unspecified trimester: Secondary | ICD-10-CM

## 2013-05-10 DIAGNOSIS — O24919 Unspecified diabetes mellitus in pregnancy, unspecified trimester: Secondary | ICD-10-CM

## 2013-05-10 NOTE — Progress Notes (Signed)
P-90 

## 2013-05-10 NOTE — Progress Notes (Signed)
Routine visit. Good FM. No problems.  NST reviewed and reactive She reports fbs less than 90 in general and 2 hour sugars 100-150

## 2013-05-12 ENCOUNTER — Encounter: Payer: 59 | Admitting: Family Medicine

## 2013-05-13 ENCOUNTER — Ambulatory Visit (INDEPENDENT_AMBULATORY_CARE_PROVIDER_SITE_OTHER): Payer: 59 | Admitting: *Deleted

## 2013-05-13 DIAGNOSIS — Z3400 Encounter for supervision of normal first pregnancy, unspecified trimester: Secondary | ICD-10-CM

## 2013-05-13 DIAGNOSIS — O24919 Unspecified diabetes mellitus in pregnancy, unspecified trimester: Secondary | ICD-10-CM

## 2013-05-13 NOTE — Progress Notes (Signed)
NST reviewed and reactive.   Patient is doing well and will return to clinic Monday for NST and OBF/tn

## 2013-05-16 ENCOUNTER — Ambulatory Visit (INDEPENDENT_AMBULATORY_CARE_PROVIDER_SITE_OTHER): Payer: 59 | Admitting: *Deleted

## 2013-05-16 DIAGNOSIS — O24919 Unspecified diabetes mellitus in pregnancy, unspecified trimester: Secondary | ICD-10-CM

## 2013-05-16 DIAGNOSIS — Z3400 Encounter for supervision of normal first pregnancy, unspecified trimester: Secondary | ICD-10-CM

## 2013-05-16 NOTE — Progress Notes (Signed)
NST is reviewed and reactive.  Patient is doing well and will return Thursday for NST and OBF/tn

## 2013-05-19 ENCOUNTER — Ambulatory Visit (INDEPENDENT_AMBULATORY_CARE_PROVIDER_SITE_OTHER): Payer: 59 | Admitting: Obstetrics & Gynecology

## 2013-05-19 ENCOUNTER — Encounter: Payer: Self-pay | Admitting: Obstetrics & Gynecology

## 2013-05-19 VITALS — BP 120/77 | Wt 171.0 lb

## 2013-05-19 DIAGNOSIS — O24919 Unspecified diabetes mellitus in pregnancy, unspecified trimester: Secondary | ICD-10-CM

## 2013-05-19 DIAGNOSIS — Z3400 Encounter for supervision of normal first pregnancy, unspecified trimester: Secondary | ICD-10-CM

## 2013-05-19 NOTE — Progress Notes (Signed)
P=[id number] 

## 2013-05-19 NOTE — Progress Notes (Signed)
Routine visit. Good FM. No problems. HbA1c drawn today at her endocrinologist's. Her basal rate was increased. She has growth scan 05-18-13. Cultures at next visit.  NST reviewed and reactive.

## 2013-05-24 ENCOUNTER — Encounter: Payer: Self-pay | Admitting: *Deleted

## 2013-05-24 ENCOUNTER — Ambulatory Visit (INDEPENDENT_AMBULATORY_CARE_PROVIDER_SITE_OTHER): Payer: 59 | Admitting: *Deleted

## 2013-05-24 DIAGNOSIS — O24919 Unspecified diabetes mellitus in pregnancy, unspecified trimester: Secondary | ICD-10-CM

## 2013-05-24 DIAGNOSIS — Z3400 Encounter for supervision of normal first pregnancy, unspecified trimester: Secondary | ICD-10-CM

## 2013-05-24 NOTE — Progress Notes (Signed)
Patient is here for NST.  Baby is active and doing patient is doing well without complaints.  NST is reviewed and reactive.

## 2013-05-27 ENCOUNTER — Inpatient Hospital Stay (HOSPITAL_COMMUNITY)
Admission: AD | Admit: 2013-05-27 | Discharge: 2013-05-29 | DRG: 774 | Disposition: A | Discharge status: Home / Self Care | Payer: 59 | Source: Ambulatory Visit | Attending: Family Medicine | Admitting: Family Medicine

## 2013-05-27 ENCOUNTER — Encounter: Payer: 59 | Admitting: Obstetrics & Gynecology

## 2013-05-27 ENCOUNTER — Encounter (HOSPITAL_COMMUNITY): Payer: 59 | Admitting: Anesthesiology

## 2013-05-27 ENCOUNTER — Inpatient Hospital Stay (HOSPITAL_COMMUNITY): Payer: 59 | Admitting: Anesthesiology

## 2013-05-27 ENCOUNTER — Encounter (HOSPITAL_COMMUNITY): Payer: Self-pay | Admitting: *Deleted

## 2013-05-27 ENCOUNTER — Ambulatory Visit (HOSPITAL_COMMUNITY): Payer: 59

## 2013-05-27 DIAGNOSIS — O24919 Unspecified diabetes mellitus in pregnancy, unspecified trimester: Secondary | ICD-10-CM

## 2013-05-27 DIAGNOSIS — Z833 Family history of diabetes mellitus: Secondary | ICD-10-CM

## 2013-05-27 DIAGNOSIS — Z8349 Family history of other endocrine, nutritional and metabolic diseases: Secondary | ICD-10-CM

## 2013-05-27 DIAGNOSIS — O4290 Premature rupture of membranes, unspecified as to length of time between rupture and onset of labor, unspecified weeks of gestation: Secondary | ICD-10-CM

## 2013-05-27 DIAGNOSIS — O2432 Unspecified pre-existing diabetes mellitus in childbirth: Secondary | ICD-10-CM

## 2013-05-27 DIAGNOSIS — Z794 Long term (current) use of insulin: Secondary | ICD-10-CM

## 2013-05-27 DIAGNOSIS — E109 Type 1 diabetes mellitus without complications: Secondary | ICD-10-CM | POA: Diagnosis present

## 2013-05-27 DIAGNOSIS — O9902 Anemia complicating childbirth: Secondary | ICD-10-CM | POA: Diagnosis present

## 2013-05-27 DIAGNOSIS — D649 Anemia, unspecified: Secondary | ICD-10-CM | POA: Diagnosis present

## 2013-05-27 DIAGNOSIS — Z6791 Unspecified blood type, Rh negative: Secondary | ICD-10-CM

## 2013-05-27 DIAGNOSIS — Z3400 Encounter for supervision of normal first pregnancy, unspecified trimester: Secondary | ICD-10-CM

## 2013-05-27 DIAGNOSIS — O26899 Other specified pregnancy related conditions, unspecified trimester: Secondary | ICD-10-CM

## 2013-05-27 LAB — COMPREHENSIVE METABOLIC PANEL
ALT: 15 U/L (ref 0–35)
AST: 28 U/L (ref 0–37)
Albumin: 2.6 g/dL — ABNORMAL LOW (ref 3.5–5.2)
Alkaline Phosphatase: 151 U/L — ABNORMAL HIGH (ref 39–117)
BUN: 13 mg/dL (ref 6–23)
CALCIUM: 8.5 mg/dL (ref 8.4–10.5)
CHLORIDE: 102 meq/L (ref 96–112)
CO2: 21 meq/L (ref 19–32)
CREATININE: 0.78 mg/dL (ref 0.50–1.10)
GFR calc Af Amer: >90 mL/min (ref >90)
Glucose, Bld: 101 mg/dL — ABNORMAL HIGH (ref 70–99)
Potassium: 4 mEq/L (ref 3.7–5.3)
SODIUM: 137 meq/L (ref 137–147)
Total Bilirubin: 0.3 mg/dL (ref 0.3–1.2)
Total Protein: 6.3 g/dL (ref 6.0–8.3)

## 2013-05-27 LAB — GLUCOSE, CAPILLARY
GLUCOSE-CAPILLARY: 116 mg/dL — AB (ref 70–99)
GLUCOSE-CAPILLARY: 73 mg/dL (ref 70–99)
GLUCOSE-CAPILLARY: 77 mg/dL (ref 70–99)
GLUCOSE-CAPILLARY: 105 mg/dL — AB (ref 70–99)
GLUCOSE-CAPILLARY: 126 mg/dL — AB (ref 70–99)
Glucose-Capillary: 93 mg/dL (ref 70–99)
Glucose-Capillary: 95 mg/dL (ref 70–99)
Glucose-Capillary: 87 mg/dL (ref 70–99)
Glucose-Capillary: 73 mg/dL (ref 70–99)
Glucose-Capillary: 72 mg/dL (ref 70–99)
Glucose-Capillary: 176 mg/dL — ABNORMAL HIGH (ref 70–99)

## 2013-05-27 LAB — OB RESULTS CONSOLE GBS: GBS: NEGATIVE

## 2013-05-27 LAB — GROUP B STREP BY PCR: Group B strep by PCR: NEGATIVE

## 2013-05-27 LAB — CBC
HCT: 31.1 % — ABNORMAL LOW (ref 36.0–46.0)
HEMOGLOBIN: 9.9 g/dL — AB (ref 12.0–15.0)
MCH: 26.4 pg (ref 26.0–34.0)
MCHC: 31.8 g/dL (ref 30.0–36.0)
MCV: 82.9 fL (ref 78.0–100.0)
Platelets: 178 10*3/uL (ref 150–400)
RBC: 3.75 MIL/uL — AB (ref 3.87–5.11)
RDW: 14.3 % (ref 11.5–15.5)
WBC: 7.6 10*3/uL (ref 4.0–10.5)

## 2013-05-27 LAB — PROTEIN / CREATININE RATIO, URINE
Creatinine, Urine: 73.37 mg/dL
PROTEIN CREATININE RATIO: 0.22 — AB (ref 0.00–0.15)
TOTAL PROTEIN, URINE: 15.9 mg/dL

## 2013-05-27 LAB — ABO/RH: ABO/RH(D): A NEG

## 2013-05-27 LAB — RPR

## 2013-05-27 LAB — POCT FERN TEST: POCT Fern Test: POSITIVE

## 2013-05-27 MED ORDER — LACTATED RINGERS IV SOLN
500.0000 mL | Freq: Once | INTRAVENOUS | Status: AC
  Administered 2013-05-27: 500 mL via INTRAVENOUS

## 2013-05-27 MED ORDER — PRENATAL MULTIVITAMIN CH
1.0000 | ORAL_TABLET | Freq: Every day | ORAL | Status: DC
  Filled 2013-05-27 (×2): qty 1

## 2013-05-27 MED ORDER — FENTANYL 2.5 MCG/ML BUPIVACAINE 1/10 % EPIDURAL INFUSION (WH - ANES)
14.0000 mL/h | INTRAMUSCULAR | Status: DC | PRN
  Administered 2013-05-27: 14 mL/h via EPIDURAL
  Filled 2013-05-27: qty 125

## 2013-05-27 MED ORDER — EPHEDRINE 5 MG/ML INJ
10.0000 mg | INTRAVENOUS | Status: DC | PRN
  Filled 2013-05-27: qty 2
  Filled 2013-05-27: qty 4

## 2013-05-27 MED ORDER — PHENYLEPHRINE 40 MCG/ML (10ML) SYRINGE FOR IV PUSH (FOR BLOOD PRESSURE SUPPORT)
80.0000 ug | PREFILLED_SYRINGE | INTRAVENOUS | Status: DC | PRN
  Filled 2013-05-27: qty 2

## 2013-05-27 MED ORDER — DOCUSATE SODIUM 100 MG PO CAPS
100.0000 mg | ORAL_CAPSULE | Freq: Every day | ORAL | Status: DC
  Administered 2013-05-28: 100 mg via ORAL
  Filled 2013-05-27 (×2): qty 1

## 2013-05-27 MED ORDER — CITRIC ACID-SODIUM CITRATE 334-500 MG/5ML PO SOLN
30.0000 mL | ORAL | Status: DC | PRN
Start: 2013-05-27 — End: 2013-05-27

## 2013-05-27 MED ORDER — INSULIN PUMP
1.0000 | Freq: Three times a day (TID) | SUBCUTANEOUS | Status: DC
Start: 2013-05-27 — End: 2013-05-29
  Administered 2013-05-27 – 2013-05-29 (×5): 1 via SUBCUTANEOUS
  Filled 2013-05-27: qty 1

## 2013-05-27 MED ORDER — OXYCODONE-ACETAMINOPHEN 5-325 MG PO TABS: 1.0000 | ORAL_TABLET | ORAL | Status: DC | PRN

## 2013-05-27 MED ORDER — GLUCAGON (RDNA) 1 MG IJ KIT: 1.0000 mg | PACK | Freq: Once | INTRAMUSCULAR | Status: DC | PRN

## 2013-05-27 MED ORDER — PENICILLIN G POTASSIUM 5000000 UNITS IJ SOLR
5.0000 10*6.[IU] | Freq: Once | INTRAVENOUS | Status: AC
  Administered 2013-05-27: 5 10*6.[IU] via INTRAVENOUS
  Filled 2013-05-27: qty 5

## 2013-05-27 MED ORDER — TETANUS-DIPHTH-ACELL PERTUSSIS 5-2.5-18.5 LF-MCG/0.5 IM SUSP: 0.5000 mL | Freq: Once | INTRAMUSCULAR | Status: DC

## 2013-05-27 MED ORDER — IBUPROFEN 600 MG PO TABS
600.0000 mg | ORAL_TABLET | Freq: Four times a day (QID) | ORAL | Status: DC | PRN
  Administered 2013-05-27: 600 mg via ORAL
  Filled 2013-05-27: qty 1

## 2013-05-27 MED ORDER — OXYTOCIN BOLUS FROM INFUSION
500.0000 mL | INTRAVENOUS | Status: DC
  Administered 2013-05-27: 500 mL via INTRAVENOUS

## 2013-05-27 MED ORDER — DIPHENHYDRAMINE HCL 25 MG PO CAPS: 25.0000 mg | ORAL_CAPSULE | Freq: Four times a day (QID) | ORAL | Status: DC | PRN

## 2013-05-27 MED ORDER — IBUPROFEN 600 MG PO TABS
600.0000 mg | ORAL_TABLET | Freq: Four times a day (QID) | ORAL | Status: DC
  Administered 2013-05-27 – 2013-05-29 (×8): 600 mg via ORAL
  Filled 2013-05-27 (×8): qty 1

## 2013-05-27 MED ORDER — WITCH HAZEL-GLYCERIN EX PADS: 1.0000 "application " | MEDICATED_PAD | CUTANEOUS | Status: DC | PRN

## 2013-05-27 MED ORDER — ONDANSETRON HCL 4 MG/2ML IJ SOLN: 4.0000 mg | Freq: Four times a day (QID) | INTRAMUSCULAR | Status: DC | PRN

## 2013-05-27 MED ORDER — ZOLPIDEM TARTRATE 5 MG PO TABS: 5.0000 mg | ORAL_TABLET | Freq: Every evening | ORAL | Status: DC | PRN

## 2013-05-27 MED ORDER — DIPHENHYDRAMINE HCL 50 MG/ML IJ SOLN: 12.5000 mg | INTRAMUSCULAR | Status: DC | PRN

## 2013-05-27 MED ORDER — SIMETHICONE 80 MG PO CHEW: 80.0000 mg | CHEWABLE_TABLET | ORAL | Status: DC | PRN

## 2013-05-27 MED ORDER — LACTATED RINGERS IV SOLN: 500.0000 mL | INTRAVENOUS | Status: DC | PRN

## 2013-05-27 MED ORDER — LIDOCAINE HCL (PF) 1 % IJ SOLN
30.0000 mL | INTRAMUSCULAR | Status: AC | PRN
  Administered 2013-05-27: 30 mL via SUBCUTANEOUS
  Filled 2013-05-27: qty 30

## 2013-05-27 MED ORDER — PHENYLEPHRINE 40 MCG/ML (10ML) SYRINGE FOR IV PUSH (FOR BLOOD PRESSURE SUPPORT)
80.0000 ug | PREFILLED_SYRINGE | INTRAVENOUS | Status: DC | PRN
  Filled 2013-05-27: qty 2
  Filled 2013-05-27: qty 10

## 2013-05-27 MED ORDER — ACETAMINOPHEN 325 MG PO TABS: 650.0000 mg | ORAL_TABLET | ORAL | Status: DC | PRN

## 2013-05-27 MED ORDER — ONDANSETRON HCL 4 MG PO TABS: 4.0000 mg | ORAL_TABLET | ORAL | Status: DC | PRN

## 2013-05-27 MED ORDER — OXYTOCIN 40 UNITS IN LACTATED RINGERS INFUSION - SIMPLE MED
62.5000 mL/h | INTRAVENOUS | Status: DC
  Filled 2013-05-27: qty 1000

## 2013-05-27 MED ORDER — BENZOCAINE-MENTHOL 20-0.5 % EX AERO: 1.0000 "application " | INHALATION_SPRAY | CUTANEOUS | Status: DC | PRN

## 2013-05-27 MED ORDER — LACTATED RINGERS IV SOLN
INTRAVENOUS | Status: DC
  Administered 2013-05-27: 14:00:00 via INTRAVENOUS

## 2013-05-27 MED ORDER — SENNOSIDES-DOCUSATE SODIUM 8.6-50 MG PO TABS
2.0000 | ORAL_TABLET | ORAL | Status: DC
  Administered 2013-05-27 – 2013-05-29 (×2): 2 via ORAL
  Filled 2013-05-27 (×2): qty 2

## 2013-05-27 MED ORDER — FENTANYL CITRATE 0.05 MG/ML IJ SOLN: 50.0000 ug | INTRAMUSCULAR | Status: DC | PRN

## 2013-05-27 MED ORDER — SODIUM BICARBONATE 8.4 % IV SOLN
INTRAVENOUS | Status: DC | PRN
  Administered 2013-05-27: 5 mL via EPIDURAL

## 2013-05-27 MED ORDER — DIBUCAINE 1 % RE OINT: 1.0000 "application " | TOPICAL_OINTMENT | RECTAL | Status: DC | PRN

## 2013-05-27 MED ORDER — MISOPROSTOL 25 MCG QUARTER TABLET: 50.0000 ug | ORAL_TABLET | ORAL | Status: DC

## 2013-05-27 MED ORDER — FLEET ENEMA 7-19 GM/118ML RE ENEM: 1.0000 | ENEMA | RECTAL | Status: DC | PRN

## 2013-05-27 MED ORDER — EPHEDRINE 5 MG/ML INJ
10.0000 mg | INTRAVENOUS | Status: DC | PRN
  Filled 2013-05-27: qty 2

## 2013-05-27 MED ORDER — ONDANSETRON HCL 4 MG/2ML IJ SOLN: 4.0000 mg | INTRAMUSCULAR | Status: DC | PRN

## 2013-05-27 MED ORDER — TERBUTALINE SULFATE 1 MG/ML IJ SOLN: 0.2500 mg | Freq: Once | INTRAMUSCULAR | Status: DC | PRN

## 2013-05-27 MED ORDER — LANOLIN HYDROUS EX OINT: TOPICAL_OINTMENT | CUTANEOUS | Status: DC | PRN

## 2013-05-27 NOTE — H&P (Signed)
Attestation of Attending Supervision of Advanced Practitioner (PA/CNM/NP): Evaluation and management procedures were performed by the Advanced Practitioner under my supervision and collaboration.  I have reviewed the Advanced Practitioner's note and chart, and I agree with the management and plan.  Reva Boresanya S Alynn Ellithorpe, MD [facility] Attending 05/27/2013 7:46 AM

## 2013-05-27 NOTE — H&P (Signed)
Karen Alexander is a 28 y.o. female presenting for PPROM at 0230.  Feels crampy. Maternal Medical History:  Reason for admission: Rupture of membranes.   Contractions: Onset was 1-2 hours ago.   Frequency: irregular.   Perceived severity is mild.    Fetal activity: Perceived fetal activity is normal.   Last perceived fetal movement was within the past hour.    Prenatal Complications - Diabetes: type 1. Diabetes is managed by insulin pump.      Filed Vitals:   05/27/13 0620  BP: 144/94  Pulse: 87  Temp:   Resp: 18    OB History   Grav Para Term Preterm Abortions TAB SAB Ect Mult Living   1 0 0 0 0 0 0 0 0 0      Past Medical History  Diagnosis Date  . Diabetes mellitus    Past Surgical History  Procedure Laterality Date  . Wisdom tooth extraction  2012    x 4  . Egg retrieval  August   Family History: family history includes Diabetes in her maternal grandmother and paternal grandfather; Gout in her father; Hypertension in her father and paternal grandfather; Hypothyroidism in her paternal grandmother; Thyroid disease in her paternal grandmother. Social History:  reports that she has never smoked. She does not have any smokeless tobacco history on file. She reports that she does not drink alcohol or use illicit drugs.   Prenatal Transfer Tool  Maternal Diabetes: type 1 DM on insulin pump, well controlled Genetic Screening: Declined Maternal Ultrasounds/Referrals: Normal Fetal Ultrasounds or other Referrals:  None Maternal Substance Abuse:  No Significant Maternal Medications:  None Significant Maternal Lab Results:  None Other Comments:  GBS pending  Review of Systems  Constitutional: Negative.   HENT: Negative.   Eyes: Negative for blurred vision and double vision.  Respiratory: Negative.   Cardiovascular: Negative.   Gastrointestinal: Negative.   Genitourinary: Negative.   Musculoskeletal: Negative.   Skin: Negative.   Neurological: Negative.  Negative for  headaches.  Psychiatric/Behavioral: Negative.     Dilation: 4 Effacement (%): 100 Station: -1 Exam by:: F Cresenzo-Dishmon CNM Blood pressure 144/94, pulse 87, temperature 98.4 F (36.9 C), temperature source Oral, resp. rate 18, height 5\' 8"  (1.727 m), weight 80.287 kg (177 lb), last menstrual period 09/19/2012, SpO2 100.00%. Blood sugar 94  Maternal Exam:  Uterine Assessment: Contraction strength is mild.  Contraction duration is 40 seconds. Contraction frequency is irregular.   Abdomen: Patient reports no abdominal tenderness. Estimated fetal weight is 67% 3 weeks ago.   Fetal presentation: vertex  Introitus: Normal vulva. Normal vagina.  Ferning test: positive.  Amniotic fluid character: clear.  Pelvis: adequate for delivery.   Cervix: Cervix evaluated by digital exam.     Physical Exam  Constitutional: She is oriented to person, place, and time. She appears well-developed and well-nourished. No distress.  Cardiovascular: Normal rate and regular rhythm.   Respiratory: Effort normal and breath sounds normal.  GI: Soft.  Genitourinary: Vagina normal.  CX 4/80/-1/vtx; leaking clear fluid   Musculoskeletal: Normal range of motion.  Neurological: She is alert and oriented to person, place, and time. She has normal reflexes.  Skin: Skin is warm and dry.  Psychiatric: She has a normal mood and affect.    Prenatal labs: ABO, Rh: A/Negative/-- (07/17 0000) Antibody: NEG (02/06 0933) Rubella:  pending RPR: NON REAC (02/06 0933)  HBsAg: Negative (07/17 0000)  HIV: NON REACTIVE (02/06 0933)  GBS:   pending  Assessment/Plan: PPROM at  35.6 weeks Early labor Elevated B/P's   GBS by PCR pending; prophylaxis until results PreX labs Monitor B/P, blood sugars May use insulin pump unless BS become uncontrolled  Jacklyn Shell 05/27/2013, 7:24 AM

## 2013-05-27 NOTE — MAU Note (Signed)
Pt reports ROM at 0215, some lower abd pressure.

## 2013-05-27 NOTE — Anesthesia Preprocedure Evaluation (Signed)
Anesthesia Evaluation  Patient identified by MRN, date of birth, ID band Patient awake    Reviewed: Allergy & Precautions, H&P , Patient's Chart, lab work & pertinent test results  Airway Mallampati: II TM Distance: >3 FB Neck ROM: full    Dental  (+) Teeth Intact   Pulmonary  breath sounds clear to auscultation        Cardiovascular Rhythm:regular Rate:Normal     Neuro/Psych    GI/Hepatic   Endo/Other  diabetes, Type 1, Insulin Dependent  Renal/GU      Musculoskeletal   Abdominal   Peds  Hematology  (+) anemia ,   Anesthesia Other Findings       Reproductive/Obstetrics (+) Pregnancy                           Anesthesia Physical Anesthesia Plan  ASA: II  Anesthesia Plan: Epidural   Post-op Pain Management:    Induction:   Airway Management Planned:   Additional Equipment:   Intra-op Plan:   Post-operative Plan:   Informed Consent: I have reviewed the patients History and Physical, chart, labs and discussed the procedure including the risks, benefits and alternatives for the proposed anesthesia with the patient or authorized representative who has indicated his/her understanding and acceptance.   Dental Advisory Given  Plan Discussed with:   Anesthesia Plan Comments: (Labs checked- platelets confirmed with RN in room. Fetal heart tracing, per RN, reported to be stable enough for sitting procedure. Discussed epidural, and patient consents to the procedure:  included risk of possible headache,backache, failed block, allergic reaction, and nerve injury. This patient was asked if she had any questions or concerns before the procedure started.)        Anesthesia Quick Evaluation

## 2013-05-27 NOTE — Progress Notes (Signed)
Patient ID: Karen Alexander Ding, female   DOB: [birth year], 28 y.o.   MRN: [id number] Karen Alexander Mynhier is a 28 y.o. G1P0000 at 5453w6d admitted for  PROM and early labor; Type 1 DM  Subjective: Coping with increasingly intense UCs. Declines analgesia. No H/A, visual disturbance.   Objective: BP 144/88  Pulse 88  Temp(Src) 98 F (36.7 C) (Oral)  Resp 20  Ht 5\' 8"  (1.727 m)  Wt 80.287 kg (177 lb)  BMI 26.92 kg/m2  SpO2 100%  LMP 09/19/2012 Filed Vitals:   05/27/13 0959 05/27/13 1030 05/27/13 1100 05/27/13 1132  BP: 135/82  144/88   Pulse: 68  83 88  Temp: 98 F (36.7 C)     TempSrc: Oral     Resp: 20 20 20    Height:      Weight:      SpO2:    100%   Results for orders placed during the hospital encounter of 05/27/13 (from the past 24 hour(s))  OB RESULTS CONSOLE GBS     Status: None   Collection Time    05/27/13 12:00 AM      Result Value Ref Range   GBS Negative    POCT FERN TEST     Status: None   Collection Time    05/27/13  5:48 AM      Result Value Ref Range   POCT Fern Test Positive = ruptured amniotic membanes    GLUCOSE, CAPILLARY     Status: None   Collection Time    05/27/13  5:53 AM      Result Value Ref Range   Glucose-Capillary 93  70 - 99 mg/dL  GROUP B STREP BY PCR     Status: None   Collection Time    05/27/13  5:55 AM      Result Value Ref Range   Group B strep by PCR NEGATIVE  NEGATIVE  CBC     Status: Abnormal   Collection Time    05/27/13  7:10 AM      Result Value Ref Range   WBC 7.6  4.0 - 10.5 K/uL   RBC 3.75 (*) 3.87 - 5.11 MIL/uL   Hemoglobin 9.9 (*) 12.0 - 15.0 g/dL   HCT 40.931.1 (*) 81.136.0 - 91.446.0 %   MCV 82.9  78.0 - 100.0 fL   MCH 26.4  26.0 - 34.0 pg   MCHC 31.8  30.0 - 36.0 g/dL   RDW 78.214.3  95.611.5 - 21.315.5 %   Platelets 178  150 - 400 K/uL  COMPREHENSIVE METABOLIC PANEL     Status: Abnormal   Collection Time    05/27/13  7:10 AM      Result Value Ref Range   Sodium 137  137 - 147 mEq/L   Potassium 4.0  3.7 - 5.3 mEq/L   Chloride 102  96 -  112 mEq/L   CO2 21  19 - 32 mEq/L   Glucose, Bld 101 (*) 70 - 99 mg/dL   BUN 13  6 - 23 mg/dL   Creatinine, Ser 0.860.78  0.50 - 1.10 mg/dL   Calcium 8.5  8.4 - 57.810.5 mg/dL   Total Protein 6.3  6.0 - 8.3 g/dL   Albumin 2.6 (*) 3.5 - 5.2 g/dL   AST 28  0 - 37 U/L   ALT 15  0 - 35 U/L   Alkaline Phosphatase 151 (*) 39 - 117 U/L   Total Bilirubin 0.3  0.3 - 1.2 mg/dL   GFR calc  non Af Amer >90  >90 mL/min   GFR calc Af Amer >90  >90 mL/min  PROTEIN / CREATININE RATIO, URINE     Status: Abnormal   Collection Time    05/27/13  7:10 AM      Result Value Ref Range   Creatinine, Urine 73.37     Total Protein, Urine 15.9     PROTEIN CREATININE RATIO 0.22 (*) 0.00 - 0.15  GLUCOSE, CAPILLARY     Status: None   Collection Time    05/27/13  7:52 AM      Result Value Ref Range   Glucose-Capillary 95  70 - 99 mg/dL  GLUCOSE, CAPILLARY     Status: Abnormal   Collection Time    05/27/13  9:02 AM      Result Value Ref Range   Glucose-Capillary 116 (*) 70 - 99 mg/dL  GLUCOSE, CAPILLARY     Status: None   Collection Time    05/27/13 10:01 AM      Result Value Ref Range   Glucose-Capillary 87  70 - 99 mg/dL  GLUCOSE, CAPILLARY     Status: None   Collection Time    05/27/13 11:01 AM      Result Value Ref Range   Glucose-Capillary 73  70 - 99 mg/dL   Fetal Heart FHR: 161-096 bpm, variability: moderate,  accelerations:  Present,  decelerations:  Absent   Contractions: q 2-2 1/2  SVE:   Dilation: 6 Effacement (%): 100 Station: -1 Exam by:: Leshea Jaggers CNM  Assessment / Plan:  Labor: Active Fetal Wellbeing: Category 1 Pain Control:  coping Expected mode of delivery: NSVD  Ragna Kramlich C Lola Lofaro 05/27/2013, 11:46 AM

## 2013-05-27 NOTE — Anesthesia Procedure Notes (Signed)

## 2013-05-28 LAB — CBC
HCT: 21.6 % — ABNORMAL LOW (ref 36.0–46.0)
Hemoglobin: 7 g/dL — ABNORMAL LOW (ref 12.0–15.0)
MCH: 26.7 pg (ref 26.0–34.0)
MCHC: 32.4 g/dL (ref 30.0–36.0)
MCV: 82.4 fL (ref 78.0–100.0)
PLATELETS: 144 10*3/uL — AB (ref 150–400)
RBC: 2.62 MIL/uL — ABNORMAL LOW (ref 3.87–5.11)
RDW: 14.2 % (ref 11.5–15.5)
WBC: 11.6 10*3/uL — AB (ref 4.0–10.5)

## 2013-05-28 LAB — GLUCOSE, CAPILLARY
GLUCOSE-CAPILLARY: 109 mg/dL — AB (ref 70–99)
GLUCOSE-CAPILLARY: 65 mg/dL — AB (ref 70–99)
Glucose-Capillary: 73 mg/dL (ref 70–99)
Glucose-Capillary: 129 mg/dL — ABNORMAL HIGH (ref 70–99)
Glucose-Capillary: 57 mg/dL — ABNORMAL LOW (ref 70–99)

## 2013-05-28 MED ORDER — RHO D IMMUNE GLOBULIN 1500 UNIT/2ML IJ SOLN
300.0000 ug | Freq: Once | INTRAMUSCULAR | Status: AC
  Administered 2013-05-28: 300 ug via INTRAMUSCULAR
  Filled 2013-05-28: qty 2

## 2013-05-28 NOTE — Lactation Note (Signed)
This note was copied from the chart of Karen Alexander. Lactation Consultation Note Initial consultation; baby in NICU; mom states baby fed very well before he was taken to NICU.  Mom has started pumping and is getting small amounts colostrum. Discussed pumping, hand expressing, NICU breastfeeding book, answered questions.  Enc mom to call for help if she has any concerns.   Patient Name: Karen Wendall StadeMichele Lye Today's Date: 05/28/2013 Reason for consult: Initial assessment;NICU baby   Maternal Data Formula Feeding for Exclusion: Yes Reason for exclusion: Admission to Intensive Care Unit (ICU) post-partum Has patient been taught Hand Expression?: Yes Does the patient have breastfeeding experience prior to this delivery?: No  Feeding    LATCH Score/Interventions                      Lactation Tools Discussed/Used Pump Review: Setup, frequency, and cleaning;Milk Storage Initiated by:: BS Date initiated:: 05/28/13   Consult Status Consult Status: Follow-up Follow-up type: In-patient    Talmadge Coventrylizabeth F Apolo Cutshaw 05/28/2013, 12:24 PM

## 2013-05-28 NOTE — Progress Notes (Signed)
Post Partum Day 1 Subjective: no complaints, up ad lib, voiding and tolerating PO, blood sugars moderately controlled.  Objective: Blood pressure 131/85, pulse 74, temperature 98.3 F (36.8 C), temperature source Oral, resp. rate 18, height 5\' 8"  (1.727 m), weight 80.287 kg (177 lb), last menstrual period 09/19/2012, SpO2 100.00%, unknown if currently breastfeeding.  Physical Exam:  General: alert, cooperative and no distress Lochia: appropriate Uterine Fundus: firm DVT Evaluation: No evidence of DVT seen on physical exam. Negative Homan's sign.   Recent Labs  05/27/13 0710 05/28/13 0525  HGB 9.9* 7.0*  HCT 31.1* 21.6*    Assessment/Plan: Plan for discharge tomorrow and Breastfeeding   LOS: 1 day   Karen Alexander 05/28/2013, 7:07 AM

## 2013-05-28 NOTE — Anesthesia Postprocedure Evaluation (Signed)
  Anesthesia Post-op Note  Patient: Karen Alexander  Procedure(s) Performed: * No procedures listed *  Patient Location: [facility] Unit  Anesthesia Type:Epidural  Level of Consciousness: awake and alert   Airway and Oxygen Therapy: Patient Spontanous Breathing  Post-op Pain: mild  Post-op Assessment: Post-op Vital signs reviewed, Patient's Cardiovascular Status Stable, Respiratory Function Stable, No signs of Nausea or vomiting, Pain level controlled, No headache, No residual numbness and No residual motor weakness  Post-op Vital Signs: Reviewed and stable  Last Vitals:  Filed Vitals:   05/28/13 1143  BP: 130/93  Pulse: 77  Temp: 36.7 C  Resp: 18    Complications: No apparent anesthesia complications

## 2013-05-29 LAB — GLUCOSE, CAPILLARY
GLUCOSE-CAPILLARY: 70 mg/dL (ref 70–99)
Glucose-Capillary: 91 mg/dL (ref 70–99)
Glucose-Capillary: 83 mg/dL (ref 70–99)

## 2013-05-29 LAB — RH IG WORKUP (INCLUDES ABO/RH)
ABO/RH(D): A NEG
Antibody Screen: POSITIVE
DAT, IgG: NEGATIVE
FETAL SCREEN: NEGATIVE
GESTATIONAL AGE(WKS): 35.6
UNIT DIVISION: 0

## 2013-05-29 MED ORDER — PNEUMOCOCCAL VAC POLYVALENT 25 MCG/0.5ML IJ INJ: 0.5000 mL | INJECTION | INTRAMUSCULAR | Status: DC

## 2013-05-29 MED ORDER — IBUPROFEN 600 MG PO TABS: 600.0000 mg | ORAL_TABLET | Freq: Four times a day (QID) | ORAL | Status: DC | PRN

## 2013-05-29 MED ORDER — DSS 100 MG PO CAPS: 100.0000 mg | ORAL_CAPSULE | Freq: Three times a day (TID) | ORAL | Status: DC | PRN

## 2013-05-29 NOTE — Discharge Instructions (Signed)

## 2013-05-29 NOTE — Progress Notes (Signed)
Discharge instructions provided to patient and significant other at bedside.  Activity, medications, follow up appointments, when to call the doctor and community resources discussed.  No questions at this time.  Patient left unit in stable condition with all personal belongings and prescriptions accompanied by staff.  K. Carman Auxier, RN------------------------   

## 2013-05-29 NOTE — Discharge Summary (Signed)
Obstetric Discharge Summary Reason for Admission: preterm premature rupture of membranes (PPROM) at 316w6d, Type 1 DM Prenatal Procedures: NST Intrapartum Procedures: spontaneous vaginal delivery Postpartum Procedures: none Complications-Operative and Postpartum: partial 3rd degree perineal laceration and mild shoulder dystocia Hemoglobin  Date Value Ref Range Status  05/28/2013 7.0* 12.0 - 15.0 g/dL Final     REPEATED TO VERIFY     DELTA CHECK NOTED     HCT  Date Value Ref Range Status  05/28/2013 21.6* 36.0 - 46.0 % Final   Vaginal Delivery Note At 3:14 PM a viable female was delivered via Vaginal, Spontaneous Delivery.  Presentation: vertex; Position: Occiput,, Anterior; Marland Kitchen.  Delivery of the head: 05/27/2013  3:13 PM First maneuver: 05/27/2013  3:13 PM, McRoberts Second maneuver: 05/27/2013  3:13 PM, Suprapubic Pressure Verbal consent: n/a  APGAR: 8, 9; weight .   Placenta status: Duncan Intact, Spontaneous. Trailing membranes, manual exploration> small membrane fragnment  Cord: 3 vessels with the following complications: None.    Anesthesia: Epidural  Episiotomy: None Lacerations: partial third degee Suture Repair: 3.0 vicryl Est. Blood Loss (mL): 400  Mom to postpartum.  Baby to Couplet care / Skin to Skin. C/W Dr. Adrian BlackwaterStinson> start CBGs qid fasting and 2 hr pp meals  Deirdre C Poe 05/27/2013, 3:58 PM  Uncomplicated postpartum course  Physical Exam:  Temp:  [98 F (36.7 C)-98.3 F (36.8 C)] 98.1 F (36.7 C) (04/12 0641) Pulse Rate:  [77-94] 86 (04/12 0641) Resp:  [18-21] 18 (04/12 0641) BP: (114-131)/(74-93) 131/82 mmHg (04/12 0641) SpO2:  [100 %] 100 % (04/12 0641) General: alert and no distress Lochia: appropriate Uterine Fundus: firm DVT Evaluation: No evidence of DVT seen on physical exam. Negative Homan's sign.  Discharge Diagnoses: PPROM - delivered  Discharge Information: Date: 05/29/2013 Activity: pelvic rest x 6 weeks Diet: Diabetic diet Medications:  Ibuprofen and Colace, continue insulin pump Condition: stable Instructions: refer to printed instruction sheet Discharge to: home Follow-up Information   [facility]. (For postpartum appointment)    Specialty:  Obstetrics and Gynecology   Contact information:   [address] [place] [id number] [phone]      Newborn Data: Live born female  Birth Weight:  APGAR: 8, 9  Admitted to NICU for hypoglycemia, still in NICU at time of patient's discharge  Tereso NewcomerUgonna A Tayna Smethurst, MD 05/29/2013, 8:46 AM

## 2013-05-29 NOTE — Lactation Note (Signed)
This note was copied from the chart of Karen Wendall StadeMichele Imes. Lactation Consultation Note  Patient Name: Karen Alexander ZOXWR'UToday's Date: 05/29/2013 Reason for consult: Follow-up assessment;NICU baby  LC saw mom in NICU.  Mom completed paperwork for hospital insurance breast pump; LC brought Medela DEBP ([employer]) to mom as requested.  Mom pumped 1-2 ml colostrum at last pumping; light yellow in color.  Mom could not remember how many times she pumped in past 24 hrs.  Encouraged mom to keep pumping log in NICU booklet and to pump at least 8 times a day (every 2 hrs during day and at least once during night).  Mom is not sure when infant will be discharged.  Informed of outpatient lactation support and hospital support group.  Mom interested in making OP appointment after discharge.  Encouraged to call if needed.   Feeding Feeding Type: Formula Nipple Type: Slow - flow Length of feed: 5 min   Lactation Tools Discussed/Used Pump Review: Setup, frequency, and cleaning   Consult Status Consult Status: Complete    Lendon KaStephanie Walker Amaar Oshita 05/29/2013, 2:11 PM

## 2013-05-29 NOTE — Progress Notes (Signed)
Clinical Social Work Department PSYCHOSOCIAL ASSESSMENT - MATERNAL/CHILD 05/29/2013  Patient:  Karen Alexander,Karen Alexander  Account Number:  [id number]  Admit Date:  05/27/2013  Childs Name:   Karen Alexander    Clinical Social Worker:  Gilford Lardizabal, LCSW   Date/Time:  05/29/2013 01:30 PM  Date Referred:  05/29/2013   Referral source  NICU     Referred reason  NICU   Other referral source:    I:  FAMILY / HOME ENVIRONMENT Child's legal guardian:  PARENT  Guardian - Name Guardian - Age Guardian - Address  Karen Alexander,Karen Alexander [address]  [place], [place] [address]   Other household support members/support persons Other support:    II  PSYCHOSOCIAL DATA Information Source:    Financial and Community Resources Employment:   Both parents employed   Financial resources:  Private Insurance If Medicaid - County:    School / Grade:   Maternity Care Coordinator / Child Services Coordination / Early Interventions:  Cultural issues impacting care:    III  STRENGTHS Strengths  Understanding of illness  Supportive family/friends  Home prepared for Child (including basic supplies)  Compliance with medical plan  Adequate Resources   Strength comment:    IV  RISK FACTORS AND CURRENT PROBLEMS Current Problem:       V  SOCIAL WORK ASSESSMENT Met with mother who in her room and father at crib side. Parents were pleasant and receptive to social work intervention.  Parents are married.  They have no other dependents.  Both parents are employed and mother reports plan to return to work.  Both parents seems to be coping well with newborn NICU admission.  Informed that they have spoken with the medical team and feel well informed.   No acute social concerns related at this time.  Mother denies any hx of substance abuse of mental illness.  Parents informed of CSW availability.      VI SOCIAL WORK PLAN Social Work Plan  Psychosocial Support/Ongoing Assessment of Needs    

## 2013-05-30 NOTE — Progress Notes (Signed)
Ur chart review completed.  

## 2013-05-31 LAB — RUBELLA ANTIBODY, IGM: Rubella IgM: 0.9 — ABNORMAL HIGH (ref <0.90)

## 2013-06-15 ENCOUNTER — Encounter (HOSPITAL_COMMUNITY): Payer: Self-pay | Admitting: *Deleted

## 2013-07-01 ENCOUNTER — Ambulatory Visit (INDEPENDENT_AMBULATORY_CARE_PROVIDER_SITE_OTHER): Payer: 59 | Admitting: Family Medicine

## 2013-07-01 ENCOUNTER — Encounter: Payer: Self-pay | Admitting: Family Medicine

## 2013-07-01 NOTE — Progress Notes (Signed)
  Subjective:     Karen StadeMichele Alexander is a 28 y.o. female who presents for a postpartum visit. She is 6 weeks postpartum following a spontaneous vaginal delivery. I have fully reviewed the prenatal and intrapartum course. The delivery was at 36 gestational weeks. Outcome: spontaneous vaginal delivery. Anesthesia: epidural. Postpartum course has been uneventful. Baby's course has been normal. Baby is feeding by bottle - Enfamil Prosobee. Bleeding staining only. Bowel function is normal. Bladder function is normal. Patient is not sexually active. Contraception method is condoms. Postpartum depression screening: negative.  The following portions of the patient's history were reviewed and updated as appropriate: allergies, current medications, past family history, past medical history, past social history, past surgical history and problem list.  Review of Systems A comprehensive review of systems was negative.   Objective:    BP 125/94  Pulse 87  Ht 5\' 8"  (1.727 m)  Wt 141 lb (63.957 kg)  BMI 21.44 kg/m2  Breastfeeding? No  General:  alert, cooperative and appears stated age   Vulva:  normal  Vagina: normal vagina  Cervix:  multiparous appearance and no lesions  Corpus: normal size, contour, position, consistency, mobility, non-tender  Adnexa:  normal adnexa        Assessment:     normal postpartum exam. Pap smear not done at today's visit.   Plan:    1. 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 Pap due in 2016 3. Follow up in: 1 year or as needed.

## 2013-09-30 ENCOUNTER — Ambulatory Visit (INDEPENDENT_AMBULATORY_CARE_PROVIDER_SITE_OTHER): Payer: Self-pay | Admitting: Family Medicine

## 2013-09-30 VITALS — BP 110/78 | Ht 68.0 in | Wt 140.6 lb

## 2013-09-30 DIAGNOSIS — E1065 Type 1 diabetes mellitus with hyperglycemia: Secondary | ICD-10-CM

## 2013-09-30 DIAGNOSIS — E109 Type 1 diabetes mellitus without complications: Secondary | ICD-10-CM

## 2013-09-30 NOTE — Progress Notes (Signed)
Subjective:  Patient presents today for 3 month diabetes follow-up as part of the employer-sponsored Link to Wellness program. Current diabetes regimen includes Humalog via insulin pump. Everything went well with her pregnancy and baby. She had Karen Alexander at [redacted] weeks gestation, after a 5 day NICU stay he came home and is doing well. Karen Alexander is working part time three days a week. She is not breast feeding now. She states that her blood sugar is still somewhat crazy following her delivery. She has an appointment to see Dr. Tedd Sias next week (previous visit with her was in April. At that point A1C was 6.7%). She will have A1C checked next week with her endocrinologist. She is no longer taking prenatal vitamins.   Disease Assessments:  Diabetes: Type of Diabetes: Type 1; checks feet daily; checks blood glucose more than 4 times a day; Sees Diabetes provider 4 or more times per year; MD managing Diabetes Dr. Tedd Sias [facility]; uses glucometer; takes medications as prescribed; does not take an aspirin a day; Diabetes Education 2011;   7 day CBG average 131; 14 day CBG average 136; 30 day CBG average 134; Highest CBG 284; Lowest CBG 55; hypoglycemia frequency several times a week;   Other Diabetes History:  She has monthly A1C checks with her endocrinologist and has her pump settings changed each time.  She states that she is checking 4-5 times daily. She reports that things are just really up and down right now. I attempted to download patient's pump to the Care Link Pro software but the software was unable to read the information from the pump. I looked through her CBG measurements and I could not discern a pattern. For a few days the fasting CBGs would be within range and then for several days they are elevated >180. The same thing happens with bedtime CBGs- some days they are within range and then other days they are very low (less than 60). She states that when she is low before she goes to bed she  eats a snack. She has not changed her eating habits drastically.   Tobacco Assessment: Smoking Status: Never smoker; Last Reviewed: 09/30/2013   Social History:  Caffeine use: cola2 cans of diet pepsi daily; Exercise adherence 3-4 days a week Walking and mild resistance training about 3 times per week. ; Medication adherence adherent; Patient can afford medications; Patient knows the purpose/use of medications; Diet adherence Greater than 75% of the time Continues to make healthy choices and maintains a healthy diet. No major changes at this time. Patient does admit to treating herself to icecream, etc. She recognizes that this always causes her to have a hyperglycemic episode. Overall, patient does well with lifestyle factors.; Denies alcohol use; 90 minutes of exercise per week.  Physical Activity-  She is trying to walk. She uses her baby as weights for her arm for weight lifting. She is also doing lunges, crunches, burpees. She is doing it three days a week for 30 minutes.  Nutrition-  She reports that her eating hasn't changed too much.  B- yogurt or protein bar L- sandwich. It depends on what she is doing. She sometimes eats out for lunch.  D- she normally cooks. Vegetables, meat, bread, fruit.  When she was pregnant she was eating more peanut butter and nuts.    Preventive Care:      Dilated Eye Exam: 11/26/2012   Flu vaccine: 11/05/2012  Foot Exam: 05/10/2012  Other Preventive Care Notes:  Dental Visit- April 2015  Vital Signs:  09/30/2013 1:57 PM (EST)Blood Pressure 110 / 78 mm/HgBMI 21.4; Height 5 ft 8 in; Weight 140.6 lbs       Assessment/Plan: Patient is a 28 year old female with DM1. Last A1C was 6.7% but this was the day before she delivered her baby four months ago. She has an appointment pending next week for an A1C check and endocrinologist visit. CBGs are elevated and patient states that she feels like they are "all over the place". She is also having a wider  swing of CBGs including may hypoglycemic events. She is treating these with drinking milk or juice and/or eating a small carbohydrate snack. She is not currently breastfeeding now. I suspect that the fluctuations in blood sugar are due to her changing schedule (her child is not consistently sleeping through the night), post partum hormone changes and small changes to eating habits and schedules due to the birth of her child. I expect that Dr. Tedd SiasSolum will need to make changes to her basal rates and or carbohydrate ratios at her visit next week. Patient continues to exercise but wants to increase back to levels before the baby. She is about 5 pounds heavier than she was pre-pregnancy, but her BMI is still less than 25. I encouraged her to continue exercise and increase as she is able. She sounds like she is under more stress now than she was previously, but that is understandable. Follow up with patient in 5 months.      Goals for Next Visit- 1. Keep your appointment with Dr. Tedd SiasSolum. 2. Increase physical activity to four days a week. Try to get a walk in on Sundays. 3. Continue making healthy eating choices. Next appointment to see me is Friday January 22nd at 9 AM.

## 2013-10-04 ENCOUNTER — Encounter: Payer: Self-pay | Admitting: Family Medicine

## 2013-10-04 NOTE — Progress Notes (Signed)
Patient ID: Karen StadeMichele Guin, female   DOB: 1985-09-16, 28 y.o.   MRN: [id number] Reviewed: Agree with the documentation and management of our pharmacologist.

## 2013-12-15 ENCOUNTER — Encounter: Payer: Self-pay | Admitting: Family Medicine

## 2013-12-15 NOTE — Progress Notes (Signed)
Patient ID: Karen Alexander, female   DOB: [birth year], 28 y.o.   MRN: [id number] Reviewed: Agree with the documentation and management of our [facility].

## 2013-12-19 ENCOUNTER — Encounter: Payer: Self-pay | Admitting: Family Medicine

## 2014-03-24 ENCOUNTER — Ambulatory Visit (INDEPENDENT_AMBULATORY_CARE_PROVIDER_SITE_OTHER): Payer: Self-pay | Admitting: Family Medicine

## 2014-03-24 VITALS — BP 102/78 | Wt 135.0 lb

## 2014-03-24 DIAGNOSIS — E109 Type 1 diabetes mellitus without complications: Secondary | ICD-10-CM

## 2014-03-24 NOTE — Assessment & Plan Note (Signed)
Subjective:  Patient presents today for 6 month diabetes follow-up as part of the employer-sponsored Link to Wellness program. Current diabetes regimen includes Humalog via insulin pump.   Patient states that things have been going well since the last visit.Last visit with Dr. Tedd SiasSolum was 7.0% in December. Patient states that her pump settings were changed. Next appointment is in March. No medication changes since her last visit with me.  She states that she and her husband would like to do a frozen embryo transfer shortly after their son turns 1 this year.   Disease Assessments:  Diabetes: Type of Diabetes: Type 1; checks feet daily; checks blood glucose more than 4 times a day; Sees Diabetes provider 4 or more times per year; MD managing Diabetes Dr. Tedd SiasSolum [facility]; uses glucometer; takes medications as prescribed; does not take an aspirin a day; Diabetes Education 2011;   Highest CBG 284; hypoglycemia frequency a few times weekly; Lowest CBG 49; 7 day CBG average 117; 14 day CBG average 112; 30 day CBG average 124;   Other Diabetes History:  She states that since her last appointment with Dr. Tedd SiasSolum (A1C of 7.0%) she has been trying to cut back on eating sweets and paying more attention to the food she is eating. Weight is down 5 pounds since her last appointment.  She is checking 6-8 times daily. This is increased from 4 times daily before December. She states that life with a child is busier than it was before he was born and she doesn't think about checking blood sugar as frequently as she was before. But she wants to do better and has increased the frequency of checking.  Patient wants to get pregnant this year and will attempt a Frozen embryo transfer in the spring/summer.  Hypoglycemia- she states she is going low a couple of times a week. Ocassionally she is going low because her son is eating off her plate and she isn't eating as much. She states that she has stopped letting him  eat off her plate because it is leading to low blood sugar levels.  CBG averages are lower than before by about 15-20 points.   Tobacco Assessment: Smoking Status: Never smoker; Last Reviewed: 03/17/2014       Physical Activity-  Patient states that she hasn't been walking outside as much because of the weather. She states that she is walking up and down the stairs as a drill because it is too cold to go outside. She is lifting 8 pound weights, doing crunches and squats four times a week.  Nutrition-  She states that things have been going well. Her husband is trying to lose weight and she is trying low carb options- low carb pizza. She is also doing vegetable noodles.    Preventive Care:    Hemoglobin A1c: 01/17/2014 Via Dr. Tedd SiasSolum 7.0    Dilated Eye Exam: 11/26/2012  Flu vaccine: 10/18/2013  Foot Exam: 05/10/2012  Other Preventive Care Notes:  Dental Visit- April 2015 next appointment is April 2016.      Overall Health Assessments:  Vision:  Dilated Eye Exam: 11/26/2012   Vital Signs:  03/24/2014 9:42 AM (EST)Blood Pressure 102 / 78 mm/HgBMI 20.5; Height 5 ft 8 in; Weight 135 lbs   Testing:  Blood Sugar Tests: Hemoglobin A1c: 7.0 via dr. Alphonsus Siassolums office resulted on 01/17/2014   Care Planning:  Learning Preference Assessment:  Learner: Patient  Readiness to Learn Barriers: None  Teaching Method: Explanation  Evaluation of Learning:  Can function independently and verbalize knowledge  Readiness to Change:  How important is your health to you? 10  How confident are you in working to improve your health? 10  How ready are you to change to improve your health? 10  Total Score: 10  Care Plan:  03/24/2014 9:42 AM (EST) (1)  Problem: Physical Activity  Role: Clinical Pharmacist  Long Term Goal Exercise four times a week. If the weather is not good exercise with a You Tube video or other workout DVD  Date Started: 03/24/2014     Assessment/Plan:  Patient is a 29 year old  female with DM1. Most recent A1C in December was 7.0% and is meeting goal of 7%. Patient states that in the past few weeks she has been more diligent with checking blood sugar and responding to hyperglycemia. Because patient will likely become pregnant in the next 3-6 months I strongly encouraged her to continue to be diligent in maintaining blood sugars at goal in order to keep A1C less than 7% and closer to the pregnancy goal of 6%. I also encouraged patient to continue physical activity and not to let weather deter her from exercising. Patient is back to pre-pregnancy weight and is down 5 pounds since her last appointment with me.  Got patient switched to the [employer] Next Link meter as the Contour testing supplies are what is covered for zero copay for patient this year.  Follow up with patient in 6 months.  Goals for Next Time- 1. Check blood sugar more frequently throughout the day- aim for 6-8 times daily. 2. Physical Activity- continue exercise four days a week. Ideas when the weather isn't nice- workout video youtube, going up and down stairs. Next appointment to see me is Friday July 22nd at 9 AM.

## 2014-04-25 NOTE — Progress Notes (Signed)
Patient ID: Karen Alexander, female   DOB: 1985-04-19, 29 y.o.   MRN: [id number] ATTENDING PHYSICIAN NOTE: I have reviewed the chart and agree with the plan as detailed above. Denny LevySara Marjon Doxtater MD Pager [phone]

## 2014-07-14 ENCOUNTER — Encounter: Payer: Self-pay | Admitting: Obstetrics & Gynecology

## 2014-07-14 ENCOUNTER — Ambulatory Visit (INDEPENDENT_AMBULATORY_CARE_PROVIDER_SITE_OTHER): Payer: 59 | Admitting: Obstetrics & Gynecology

## 2014-07-14 ENCOUNTER — Encounter: Payer: Self-pay | Admitting: Pharmacist

## 2014-07-14 VITALS — BP 110/78 | HR 74 | Wt 135.0 lb

## 2014-07-14 DIAGNOSIS — Z113 Encounter for screening for infections with a predominantly sexual mode of transmission: Secondary | ICD-10-CM

## 2014-07-14 DIAGNOSIS — Z3481 Encounter for supervision of other normal pregnancy, first trimester: Secondary | ICD-10-CM | POA: Diagnosis not present

## 2014-07-14 DIAGNOSIS — Z1151 Encounter for screening for human papillomavirus (HPV): Secondary | ICD-10-CM | POA: Diagnosis not present

## 2014-07-14 DIAGNOSIS — O24414 Gestational diabetes mellitus in pregnancy, insulin controlled: Secondary | ICD-10-CM

## 2014-07-14 DIAGNOSIS — Z124 Encounter for screening for malignant neoplasm of cervix: Secondary | ICD-10-CM

## 2014-07-14 DIAGNOSIS — N469 Male infertility, unspecified: Secondary | ICD-10-CM

## 2014-07-14 NOTE — Progress Notes (Deleted)
Bedside US shows single IUP measuring CRL 7w

## 2014-07-14 NOTE — Progress Notes (Signed)
   Subjective:    Karen Alexander is a MW  [id number] Unknown being seen today for her first obstetrical visit.  Her obstetrical history is significant for Type 1 DM, on a pump. Her recent hemoglobin A1 Alexander was 6.3 so she made adjustments on her pump to keep her fastings less than 100. She had SROM at 36 weeks with her last pregnancy and baby weighed 9-1. Patient does intend to breast feed. Pregnancy history fully reviewed.  Patient reports no complaints.  There were no vitals filed for this visit.  HISTORY: OB History  Gravida Para Term Preterm AB SAB TAB Ectopic Multiple Living  2 1 0 1 0 0 0 0 0 1     # Outcome Date GA Lbr Len/2nd Weight Sex Delivery Anes PTL Lv  2 Current           1 Preterm 05/27/13 3488w6d 02:39 / 00:50 9 lb 1.3 oz (4.12 kg) M Vag-Spont EPI  Y     Past Medical History  Diagnosis Date  . Diabetes mellitus     diagnosed at age 29   Past Surgical History  Procedure Laterality Date  . Wisdom tooth extraction  2012    x 4  . Egg retrieval  August   Family History  Problem Relation Age of Onset  . Gout Father   . Hypertension Father   . Diabetes Maternal Grandmother     type2  . Hypothyroidism Paternal Grandmother   . Thyroid disease Paternal Grandmother   . Diabetes Paternal Grandfather     type 2  . Hypertension Paternal Grandfather      Exam    Uterus:     Pelvic Exam:    Perineum: No Hemorrhoids   Vulva: normal   Vagina:  normal mucosa   pH:    Cervix: anteverted   Adnexa: normal adnexa, proven to 9-1   Bony Pelvis: android  System: Breast:  normal appearance, no masses or tenderness   Skin: normal coloration and turgor, no rashes    Neurologic: oriented   Extremities: normal strength, tone, and muscle mass   HEENT PERRLA   Mouth/Teeth mucous membranes moist, pharynx normal without lesions   Neck supple   Cardiovascular: regular rate and rhythm   Respiratory:  appears well, vitals normal, no respiratory distress, acyanotic, normal RR, ear  and throat exam is normal, neck free of mass or lymphadenopathy, chest clear, no wheezing, crepitations, rhonchi, normal symmetric air entry   Abdomen: soft, non-tender; bowel sounds normal; no masses,  no organomegaly   Urinary: urethral meatus normal      Assessment:    Pregnancy: [id number] Patient Active Problem List   Diagnosis Date Noted  . Encounter for supervision of other normal pregnancy in first trimester 07/14/2014  . 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 DIAB W/O COMP TYPE I [JUV] NOT STATED UNCNTRL 01/30/2010  . ALLERGIC RHINITIS 01/30/2010    SROM at 36 weeks with last pregnancy    Plan:     Initial labs drawn. Prenatal vitamins. Problem list reviewed and updated. Genetic Screening discussed First Screen: declined.  Ultrasound discussed; fetal survey: requested.  Follow up in 4 weeks. Pap done today She declines genetic testing. Her   Karen Alexander. 07/14/2014

## 2014-07-14 NOTE — Progress Notes (Signed)
Bedside US shows single IUP 1357w6d CRL 194 FHR

## 2014-07-19 LAB — CYTOLOGY - PAP

## 2014-07-29 ENCOUNTER — Inpatient Hospital Stay (HOSPITAL_COMMUNITY)
Admission: AD | Admit: 2014-07-29 | Discharge: 2014-07-30 | Disposition: A | Discharge status: Home / Self Care | Payer: 59 | Source: Ambulatory Visit | Attending: Obstetrics and Gynecology | Admitting: Obstetrics and Gynecology

## 2014-07-29 ENCOUNTER — Encounter (HOSPITAL_COMMUNITY): Payer: Self-pay | Admitting: *Deleted

## 2014-07-29 DIAGNOSIS — O209 Hemorrhage in early pregnancy, unspecified: Secondary | ICD-10-CM

## 2014-07-29 DIAGNOSIS — O09293 Supervision of pregnancy with other poor reproductive or obstetric history, third trimester: Secondary | ICD-10-CM

## 2014-07-29 DIAGNOSIS — O021 Missed abortion: Secondary | ICD-10-CM | POA: Insufficient documentation

## 2014-07-29 LAB — URINALYSIS, ROUTINE W REFLEX MICROSCOPIC
Bilirubin Urine: NEGATIVE
Glucose, UA: NEGATIVE mg/dL
Ketones, ur: NEGATIVE mg/dL
Leukocytes, UA: NEGATIVE
Nitrite: NEGATIVE
PROTEIN: NEGATIVE mg/dL
Specific Gravity, Urine: <1.005 — ABNORMAL LOW (ref 1.005–1.030)
Urobilinogen, UA: 0.2 mg/dL (ref 0.0–1.0)
pH: 5.5 (ref 5.0–8.0)

## 2014-07-29 LAB — POCT PREGNANCY, URINE: Preg Test, Ur: POSITIVE — AB

## 2014-07-29 LAB — URINE MICROSCOPIC-ADD ON

## 2014-07-29 NOTE — MAU Note (Signed)
Spotting about an hour ago. Brownish spotting. About later spotting was red on tissue when wiped. No pain

## 2014-07-30 ENCOUNTER — Inpatient Hospital Stay (HOSPITAL_COMMUNITY): Payer: 59

## 2014-07-30 ENCOUNTER — Encounter (HOSPITAL_COMMUNITY): Payer: Self-pay | Admitting: Anesthesiology

## 2014-07-30 DIAGNOSIS — O021 Missed abortion: Secondary | ICD-10-CM | POA: Diagnosis not present

## 2014-07-30 MED ORDER — RHO D IMMUNE GLOBULIN 1500 UNIT/2ML IJ SOSY
300.0000 ug | PREFILLED_SYRINGE | Freq: Once | INTRAMUSCULAR | Status: AC
  Administered 2014-07-30: 300 ug via INTRAMUSCULAR
  Filled 2014-07-30: qty 2

## 2014-07-30 NOTE — Discharge Instructions (Signed)
NO Food or Drink after midnight on the morning of the procedure  Dilation and Curettage or Vacuum Curettage Dilation and curettage (D&C) and vacuum curettage are minor procedures. A D&C involves stretching (dilation) the cervix and scraping (curettage) the inside lining of the womb (uterus). During a D&C, tissue is gently scraped from the inside lining of the uterus. During a vacuum curettage, the lining and tissue in the uterus are removed with the use of gentle suction.  Curettage may be performed to either diagnose or treat a problem. As a diagnostic procedure, curettage is performed to examine tissues from the uterus. A diagnostic curettage may be performed for the following symptoms:   Irregular bleeding in the uterus.   Bleeding with the development of clots.   Spotting between menstrual periods.   Prolonged menstrual periods.   Bleeding after menopause.   No menstrual period (amenorrhea).   A change in size and shape of the uterus.  As a treatment procedure, curettage may be performed for the following reasons:   Removal of an IUD (intrauterine device).   Removal of retained placenta after giving birth. Retained placenta can cause an infection or bleeding severe enough to require transfusions.   Abortion.   Miscarriage.   Removal of polyps inside the uterus.   Removal of uncommon types of noncancerous lumps (fibroids).  LET [facility] CARE PROVIDER KNOW ABOUT:   Any allergies you have.   All medicines you are taking, including vitamins, herbs, eye drops, creams, and over-the-counter medicines.   Previous problems you or members of your family have had with the use of anesthetics.   Any blood disorders you have.   Previous surgeries you have had.   Medical conditions you have. RISKS AND COMPLICATIONS  Generally, this is a safe procedure. However, as with any procedure, complications can occur. Possible complications include:  Excessive  bleeding.   Infection of the uterus.   Damage to the cervix.   Development of scar tissue (adhesions) inside the uterus, later causing abnormal amounts of menstrual bleeding.   Complications from the general anesthetic, if a general anesthetic is used.   Putting a hole (perforation) in the uterus. This is rare.  BEFORE THE PROCEDURE   Eat and drink before the procedure only as directed by your health care provider.   Arrange for someone to take you home.  PROCEDURE  This procedure usually takes about 15-30 minutes.  You will be given one of the following:  A medicine that numbs the area in and around the cervix (local anesthetic).   A medicine to make you sleep through the procedure (general anesthetic).  You will lie on your back with your legs in stirrups.   A warm metal or plastic instrument (speculum) will be placed in your vagina to keep it open and to allow the health care provider to see the cervix.  There are two ways in which your cervix can be softened and dilated. These include:   Taking a medicine.   Having thin rods (laminaria) inserted into your cervix.   A curved tool (curette) will be used to scrape cells from the inside lining of the uterus. In some cases, gentle suction is applied with the curette. The curette will then be removed.  AFTER THE PROCEDURE   You will rest in the recovery area until you are stable and are ready to go home.   You may feel sick to your stomach (nauseous) or throw up (vomit) if you were given a  general anesthetic.   You may have a sore throat if a tube was placed in your throat during general anesthesia.   You may have light cramping and bleeding. This may last for 2 days to 2 weeks after the procedure.   Your uterus needs to make a new lining after the procedure. This may make your next period late. Document Released: 02/03/2005 Document Revised: 10/06/2012 Document Reviewed: 09/02/2012 [facility] Patient  Information 2015 [place], [place]. This information is not intended to replace advice given to you by your health care provider. Make sure you discuss any questions you have with your health care provider. Miscarriage A miscarriage is the sudden loss of an unborn baby (fetus) before the 20th week of pregnancy. Most miscarriages happen in the first 3 months of pregnancy. Sometimes, it happens before a woman even knows she is pregnant. A miscarriage is also called a "spontaneous miscarriage" or "early pregnancy loss." Having a miscarriage can be an emotional experience. Talk with your caregiver about any questions you may have about miscarrying, the grieving process, and your future pregnancy plans. CAUSES   Problems with the fetal chromosomes that make it impossible for the baby to develop normally. Problems with the baby's genes or chromosomes are most often the result of errors that occur, by chance, as the embryo divides and grows. The problems are not inherited from the parents.  Infection of the cervix or uterus.   Hormone problems.   Problems with the cervix, such as having an incompetent cervix. This is when the tissue in the cervix is not strong enough to hold the pregnancy.   Problems with the uterus, such as an abnormally shaped uterus, uterine fibroids, or congenital abnormalities.   Certain medical conditions.   Smoking, drinking alcohol, or taking illegal drugs.   Trauma.  Often, the cause of a miscarriage is unknown.  SYMPTOMS   Vaginal bleeding or spotting, with or without cramps or pain.  Pain or cramping in the abdomen or lower back.  Passing fluid, tissue, or blood clots from the vagina. DIAGNOSIS  Your caregiver will perform a physical exam. You may also have an ultrasound to confirm the miscarriage. Blood or urine tests may also be ordered. TREATMENT   Sometimes, treatment is not necessary if you naturally pass all the fetal tissue that was in the uterus. If  some of the fetus or placenta remains in the body (incomplete miscarriage), tissue left behind may become infected and must be removed. Usually, a dilation and curettage (D and C) procedure is performed. During a D and C procedure, the cervix is widened (dilated) and any remaining fetal or placental tissue is gently removed from the uterus.  Antibiotic medicines are prescribed if there is an infection. Other medicines may be given to reduce the size of the uterus (contract) if there is a lot of bleeding.  If you have Rh negative blood and your baby was Rh positive, you will need a Rh immunoglobulin shot. This shot will protect any future baby from having Rh blood problems in future pregnancies. HOME CARE INSTRUCTIONS   Your caregiver may order bed rest or may allow you to continue light activity. Resume activity as directed by your caregiver.  Have someone help with home and family responsibilities during this time.   Keep track of the number of sanitary pads you use each day and how soaked (saturated) they are. Write down this information.   Do not use tampons. Do not douche or have sexual intercourse until  approved by your caregiver.   Only take over-the-counter or prescription medicines for pain or discomfort as directed by your caregiver.   Do not take aspirin. Aspirin can cause bleeding.   Keep all follow-up appointments with your caregiver.   If you or your partner have problems with grieving, talk to your caregiver or seek counseling to help cope with the pregnancy loss. Allow enough time to grieve before trying to get pregnant again.  SEEK IMMEDIATE MEDICAL CARE IF:   You have severe cramps or pain in your back or abdomen.  You have a fever.  You pass large blood clots (walnut-sized or larger) ortissue from your vagina. Save any tissue for your caregiver to inspect.   Your bleeding increases.   You have a thick, bad-smelling vaginal discharge.  You become  lightheaded, weak, or you faint.   You have chills.  MAKE SURE YOU:  Understand these instructions.  Will watch your condition.  Will get help right away if you are not doing well or get worse. Document Released: 07/30/2000 Document Revised: 05/31/2012 Document Reviewed: 03/25/2011 [facility] Patient Information 2015 [place], [place]. This information is not intended to replace advice given to you by your health care provider. Make sure you discuss any questions you have with your health care provider.

## 2014-07-30 NOTE — Anesthesia Preprocedure Evaluation (Addendum)
Anesthesia Evaluation  Patient identified by MRN, date of birth, ID band Patient awake    Reviewed: Allergy & Precautions, NPO status , Patient's Chart, lab work & pertinent test results  Airway Mallampati: II  TM Distance: >3 FB Neck ROM: Full    Dental no notable dental hx. (+) Teeth Intact   Pulmonary neg pulmonary ROS,  breath sounds clear to auscultation  Pulmonary exam normal       Cardiovascular negative cardio ROS Normal cardiovascular examRhythm:Regular Rate:Normal     Neuro/Psych negative neurological ROS  negative psych ROS   GI/Hepatic negative GI ROS, Neg liver ROS,   Endo/Other  diabetes, Well Controlled, Type 1, Insulin Dependent  Renal/GU negative Renal ROS  negative genitourinary   Musculoskeletal   Abdominal (+) - obese,   Peds  Hematology   Anesthesia Other Findings   Reproductive/Obstetrics (+) Pregnancy Missed ab                            Anesthesia Physical Anesthesia Plan  ASA: II  Anesthesia Plan: MAC   Post-op Pain Management:    Induction: Intravenous  Airway Management Planned: Natural Airway and Mask  Additional Equipment:   Intra-op Plan:   Post-operative Plan:   Informed Consent: I have reviewed the patients History and Physical, chart, labs and discussed the procedure including the risks, benefits and alternatives for the proposed anesthesia with the patient or authorized representative who has indicated his/her understanding and acceptance.   Dental advisory given  Plan Discussed with: CRNA, Anesthesiologist and Surgeon  Anesthesia Plan Comments:         Anesthesia Quick Evaluation

## 2014-07-30 NOTE — MAU Provider Note (Signed)
History     CSN: [id number]  Arrival date and time: 07/29/14 2149   First Provider Initiated Contact with Patient 07/30/14 0017      Chief Complaint  Patient presents with  . Vaginal Bleeding   HPI Karen Alexander 29 y.o. [id number]  presents to MAU complaining of vaginal bleeding around 8:30pm.  She had usual activity today.  No IC x 1 week.  She is wearing a panty liner and notes small amts.  It started brown, then became bright red and now back to brownish color.  No clots noted.  She feels confident it is vaginal and not urethral.  She had in-office u/s at [facility] and everything was fine at that time (2 weeks ago).  She denies fever, weakness, dizziness, LOF, dysuria, abdominal pain.   OB History    Gravida Para Term Preterm AB TAB SAB Ectopic Multiple Living        Past Medical History  Diagnosis Date  . Diabetes mellitus     diagnosed at age 55    Past Surgical History  Procedure Laterality Date  . Wisdom tooth extraction  2012    x 4  . Egg retrieval  August    Family History  Problem Relation Age of Onset  . Gout Father   . Hypertension Father   . Diabetes Maternal Grandmother     type2  . Hypothyroidism Paternal Grandmother   . Thyroid disease Paternal Grandmother   . Diabetes Paternal Grandfather     type 2  . Hypertension Paternal Grandfather     History  Substance Use Topics  . Smoking status: Never Smoker   . Smokeless tobacco: Never Used  . Alcohol Use: No    Allergies: No Known Allergies  Prescriptions prior to admission  Medication Sig Dispense Refill Last Dose  . glucagon (GLUCAGON EMERGENCY) 1 MG injection Inject 1 mg into the vein once as needed (for hypoglycemia).   Taking  . ibuprofen (ADVIL,MOTRIN) 600 MG tablet Take 1 tablet (600 mg total) by mouth every 6 (six) hours as needed for mild pain or moderate pain. 30 tablet 2 Taking  . Insulin Human (INSULIN PUMP) SOLN Inject 1 each into the skin 3 times daily with  meals, bedtime and 2 AM. humalog   Taking  . loratadine (CLARITIN) 10 MG tablet Take 10 mg by mouth daily.   Taking    ROS Pertinent ROS in HPI.  All other systems are negative.   Physical Exam   Blood pressure 140/77, pulse 83, temperature 98.1 F (36.7 C), resp. rate 18, height  (1.727 m), weight 139 lb 3.2 oz (63.141 kg), not currently breastfeeding.  Physical Exam  Constitutional: She is oriented to person, place, and time. She appears well-developed and well-nourished. No distress.  HENT:  Head: Normocephalic and atraumatic.  Eyes: EOM are normal.  Neck: Normal range of motion.  Cardiovascular: Normal rate.   Respiratory: Effort normal and breath sounds normal.  GI: Bowel sounds are normal.  Genitourinary:  Normal external genitalia.   Vagina with small to mod amt of dark brown blood in vault but no active bleeding.  Cervix is closed.  No CMT/adnexal mass or tenderness  Musculoskeletal: Normal range of motion.  Neurological: She is alert and oriented to person, place, and time.  Skin: Skin is warm and dry.  Psychiatric: She has a normal mood and affect.   Results for orders placed or performed during  the hospital encounter of 07/29/14 (from the past 24 hour(s))  Urinalysis, Routine w reflex microscopic (not at [facility])     Status: Abnormal   Collection Time: 07/29/14 10:10 PM  Result Value Ref Range   Color, Urine YELLOW YELLOW   APPearance CLEAR CLEAR   Specific Gravity, Urine <1.005 (L) 1.005 - 1.030   pH 5.5 5.0 - 8.0   Glucose, UA NEGATIVE NEGATIVE mg/dL   Hgb urine dipstick MODERATE (A) NEGATIVE   Bilirubin Urine NEGATIVE NEGATIVE   Ketones, ur NEGATIVE NEGATIVE mg/dL   Protein, ur NEGATIVE NEGATIVE mg/dL   Urobilinogen, UA 0.2 0.0 - 1.0 mg/dL   Nitrite NEGATIVE NEGATIVE   Leukocytes, UA NEGATIVE NEGATIVE  Urine microscopic-add on     Status: None   Collection Time: 07/29/14 10:10 PM  Result Value Ref Range   Squamous Epithelial / LPF RARE RARE   WBC, UA  0-2 <3 WBC/hpf   Bacteria, UA RARE RARE  Pregnancy, urine POC     Status: Abnormal   Collection Time: 07/29/14 10:14 PM  Result Value Ref Range   Preg Test, Ur POSITIVE (A) NEGATIVE  Rh IG workup (includes ABO/Rh)     Status: None (Preliminary result)   Collection Time: 07/30/14 12:40 AM  Result Value Ref Range   Gestational Age(Wks) 10    ABO/RH(D) A NEG    Antibody Screen NEG    Unit Number [id number]    Blood Component Type RHIG    Unit division 00    Status of Unit ISSUED    Transfusion Status OK TO TRANSFUSE    US Ob Comp Less 14 Wks  07/30/2014   CLINICAL DATA:  Acute onset of vaginal bleeding.  Initial encounter.  EXAM: OBSTETRIC <14 WK [place] AND TRANSVAGINAL OB US  TECHNIQUE: Both transabdominal and transvaginal ultrasound examinations were performed for complete evaluation of the gestation as well as the maternal uterus, adnexal regions, and pelvic cul-de-sac. Transvaginal technique was performed to assess early pregnancy.  COMPARISON:  Pelvic ultrasound performed 04/28/2013  FINDINGS: Intrauterine gestational sac: Visualized/normal in shape.  Yolk sac:  Yes  Embryo:  Yes  Cardiac Activity: No  Heart Rate: N/A  CRL:  2.4 cm   9 w   1 d  Maternal uterus/adnexae: No subchorionic hemorrhage is noted. The uterus is otherwise unremarkable in appearance.  The ovaries are within normal limits. The right ovary measures 3.0 x 1.8 x 2.5 cm, while the left ovary measures 3.2 x 1.7 x 2.4 cm. No suspicious adnexal masses are seen; there is no evidence for ovarian torsion.  Trace free fluid is seen within the pelvic cul-de-sac.  IMPRESSION: Single intrauterine gestational sac noted, with an embryo measuring 2.4 cm in crown-rump length. No evidence of cardiac activity. This is compatible with fetal demise and missed spontaneous abortion.   Electronically Signed   By: Roanna Raider M.D.   On: 07/30/2014 02:13   US Ob Transvaginal  07/30/2014   CLINICAL DATA:  Acute onset of vaginal bleeding.  Initial  encounter.  EXAM: OBSTETRIC <14 WK [place] AND TRANSVAGINAL OB US  TECHNIQUE: Both transabdominal and transvaginal ultrasound examinations were performed for complete evaluation of the gestation as well as the maternal uterus, adnexal regions, and pelvic cul-de-sac. Transvaginal technique was performed to assess early pregnancy.  COMPARISON:  Pelvic ultrasound performed 04/28/2013  FINDINGS: Intrauterine gestational sac: Visualized/normal in shape.  Yolk sac:  Yes  Embryo:  Yes  Cardiac Activity: No  Heart Rate: N/A  CRL:  2.4  cm   9 w   1 d  Maternal uterus/adnexae: No subchorionic hemorrhage is noted. The uterus is otherwise unremarkable in appearance.  The ovaries are within normal limits. The right ovary measures 3.0 x 1.8 x 2.5 cm, while the left ovary measures 3.2 x 1.7 x 2.4 cm. No suspicious adnexal masses are seen; there is no evidence for ovarian torsion.  Trace free fluid is seen within the pelvic cul-de-sac.  IMPRESSION: Single intrauterine gestational sac noted, with an embryo measuring 2.4 cm in crown-rump length. No evidence of cardiac activity. This is compatible with fetal demise and missed spontaneous abortion.   Electronically Signed   By: Roanna Raider M.D.   On: 07/30/2014 02:13   MAU Course  Procedures  MDM Discussed with Dr. Jolayne Panther.  MD has arranged for D&E on Monday at 10am - be here 8am/NPO after midnight.  She is agreeable to use Cytotec should pt choose to do so.   No bloodwork required if no Cytotec used.  Okay for discharge. Pt chooses not to use Cytotec.    Assessment and Plan  A:  1. Abortion, missed   2. Vaginal bleeding in pregnancy, first trimester    P:Discharge to home Pt to return to MAU on Monday, 6/13 at 8am for 10am procedure.  NPO after midnight in anticipation. Work note through 6/14 given.  Patient may return to MAU as needed or if her condition were to change or worsen    Bertram Denver 07/30/2014, 12:17 AM

## 2014-07-31 ENCOUNTER — Ambulatory Visit (HOSPITAL_COMMUNITY): Payer: 59 | Admitting: Anesthesiology

## 2014-07-31 ENCOUNTER — Encounter (HOSPITAL_COMMUNITY)
Admission: AD | Disposition: A | Discharge status: Home / Self Care | Payer: Self-pay | Source: Ambulatory Visit | Attending: Obstetrics and Gynecology

## 2014-07-31 ENCOUNTER — Ambulatory Visit: Admit: 2014-07-31 | Payer: Self-pay | Admitting: Obstetrics and Gynecology

## 2014-07-31 ENCOUNTER — Encounter (HOSPITAL_COMMUNITY): Payer: Self-pay | Admitting: Emergency Medicine

## 2014-07-31 ENCOUNTER — Ambulatory Visit (HOSPITAL_COMMUNITY)
Admission: AD | Admit: 2014-07-31 | Discharge: 2014-07-31 | Disposition: A | Discharge status: Home / Self Care | Payer: 59 | Source: Ambulatory Visit | Attending: Obstetrics and Gynecology | Admitting: Obstetrics and Gynecology

## 2014-07-31 DIAGNOSIS — E669 Obesity, unspecified: Secondary | ICD-10-CM | POA: Diagnosis not present

## 2014-07-31 DIAGNOSIS — N854 Malposition of uterus: Secondary | ICD-10-CM | POA: Diagnosis not present

## 2014-07-31 DIAGNOSIS — Z9641 Presence of insulin pump (external) (internal): Secondary | ICD-10-CM | POA: Diagnosis not present

## 2014-07-31 DIAGNOSIS — O021 Missed abortion: Secondary | ICD-10-CM | POA: Diagnosis present

## 2014-07-31 DIAGNOSIS — Z79899 Other long term (current) drug therapy: Secondary | ICD-10-CM | POA: Insufficient documentation

## 2014-07-31 DIAGNOSIS — Z791 Long term (current) use of non-steroidal anti-inflammatories (NSAID): Secondary | ICD-10-CM | POA: Diagnosis not present

## 2014-07-31 DIAGNOSIS — E109 Type 1 diabetes mellitus without complications: Secondary | ICD-10-CM | POA: Diagnosis not present

## 2014-07-31 DIAGNOSIS — Z3A09 9 weeks gestation of pregnancy: Secondary | ICD-10-CM | POA: Diagnosis not present

## 2014-07-31 HISTORY — PX: DILATION AND EVACUATION: SHX1459

## 2014-07-31 HISTORY — DX: Missed abortion: O02.1

## 2014-07-31 LAB — CBC
HEMATOCRIT: 38.5 % (ref 36.0–46.0)
Hemoglobin: 12.9 g/dL (ref 12.0–15.0)
MCH: 29.9 pg (ref 26.0–34.0)
MCHC: 33.5 g/dL (ref 30.0–36.0)
MCV: 89.1 fL (ref 78.0–100.0)
Platelets: 216 10*3/uL (ref 150–400)
RBC: 4.32 MIL/uL (ref 3.87–5.11)
RDW: 13.3 % (ref 11.5–15.5)
WBC: 5.2 10*3/uL (ref 4.0–10.5)

## 2014-07-31 LAB — RH IG WORKUP (INCLUDES ABO/RH)
ABO/RH(D): A NEG
ANTIBODY SCREEN: NEGATIVE
Gestational Age(Wks): 10
Unit division: 0

## 2014-07-31 LAB — BASIC METABOLIC PANEL
Anion gap: 5 (ref 5–15)
BUN: 12 mg/dL (ref 6–20)
CHLORIDE: 107 mmol/L (ref 101–111)
CO2: 24 mmol/L (ref 22–32)
CREATININE: 0.75 mg/dL (ref 0.44–1.00)
Calcium: 8.8 mg/dL — ABNORMAL LOW (ref 8.9–10.3)
GFR calc Af Amer: >60 mL/min (ref >60)
GFR calc non Af Amer: >60 mL/min (ref >60)
Glucose, Bld: 141 mg/dL — ABNORMAL HIGH (ref 65–99)
Potassium: 3.9 mmol/L (ref 3.5–5.1)
Sodium: 136 mmol/L (ref 135–145)

## 2014-07-31 LAB — GLUCOSE, CAPILLARY: Glucose-Capillary: 122 mg/dL — ABNORMAL HIGH (ref 65–99)

## 2014-07-31 SURGERY — DILATION AND EVACUATION, UTERUS
Anesthesia: Monitor Anesthesia Care | Site: Vagina

## 2014-07-31 SURGERY — DILATION AND EVACUATION, UTERUS
Anesthesia: Choice

## 2014-07-31 MED ORDER — LIDOCAINE HCL (CARDIAC) 20 MG/ML IV SOLN
INTRAVENOUS | Status: AC
  Filled 2014-07-31: qty 5

## 2014-07-31 MED ORDER — SCOPOLAMINE 1 MG/3DAYS TD PT72
1.0000 | MEDICATED_PATCH | Freq: Once | TRANSDERMAL | Status: DC
  Administered 2014-07-31: 1.5 mg via TRANSDERMAL

## 2014-07-31 MED ORDER — IBUPROFEN 600 MG PO TABS: 600.0000 mg | ORAL_TABLET | Freq: Four times a day (QID) | ORAL | Status: DC | PRN

## 2014-07-31 MED ORDER — OXYCODONE-ACETAMINOPHEN 5-325 MG PO TABS: 1.0000 | ORAL_TABLET | ORAL | Status: DC | PRN

## 2014-07-31 MED ORDER — METOCLOPRAMIDE HCL 5 MG/ML IJ SOLN: 10.0000 mg | Freq: Once | INTRAMUSCULAR | Status: DC | PRN

## 2014-07-31 MED ORDER — KETOROLAC TROMETHAMINE 30 MG/ML IJ SOLN
INTRAMUSCULAR | Status: AC
  Filled 2014-07-31: qty 1

## 2014-07-31 MED ORDER — PROPOFOL 10 MG/ML IV BOLUS
INTRAVENOUS | Status: DC | PRN
  Administered 2014-07-31 (×2): 50 mg via INTRAVENOUS

## 2014-07-31 MED ORDER — MIDAZOLAM HCL 2 MG/2ML IJ SOLN
INTRAMUSCULAR | Status: AC
  Filled 2014-07-31: qty 2

## 2014-07-31 MED ORDER — CHLOROPROCAINE HCL 1 % IJ SOLN
INTRAMUSCULAR | Status: AC
  Filled 2014-07-31: qty 30

## 2014-07-31 MED ORDER — FENTANYL CITRATE (PF) 100 MCG/2ML IJ SOLN
INTRAMUSCULAR | Status: DC | PRN
  Administered 2014-07-31: 100 ug via INTRAVENOUS

## 2014-07-31 MED ORDER — HYDROCODONE-ACETAMINOPHEN 7.5-325 MG PO TABS: 1.0000 | ORAL_TABLET | Freq: Once | ORAL | Status: DC | PRN

## 2014-07-31 MED ORDER — LACTATED RINGERS IV SOLN
INTRAVENOUS | Status: DC
  Administered 2014-07-31: 09:00:00 via INTRAVENOUS

## 2014-07-31 MED ORDER — MIDAZOLAM HCL 5 MG/5ML IJ SOLN
INTRAMUSCULAR | Status: DC | PRN
  Administered 2014-07-31: 2 mg via INTRAVENOUS

## 2014-07-31 MED ORDER — PROPOFOL 10 MG/ML IV BOLUS
INTRAVENOUS | Status: AC
  Filled 2014-07-31: qty 20

## 2014-07-31 MED ORDER — LIDOCAINE HCL (CARDIAC) 20 MG/ML IV SOLN
INTRAVENOUS | Status: DC | PRN
  Administered 2014-07-31: 40 mg via INTRAVENOUS

## 2014-07-31 MED ORDER — FENTANYL CITRATE (PF) 250 MCG/5ML IJ SOLN
INTRAMUSCULAR | Status: AC
  Filled 2014-07-31: qty 5

## 2014-07-31 MED ORDER — FENTANYL CITRATE (PF) 100 MCG/2ML IJ SOLN: 25.0000 ug | INTRAMUSCULAR | Status: DC | PRN

## 2014-07-31 MED ORDER — ONDANSETRON HCL 4 MG/2ML IJ SOLN
INTRAMUSCULAR | Status: DC | PRN
  Administered 2014-07-31: 4 mg via INTRAVENOUS

## 2014-07-31 MED ORDER — ONDANSETRON HCL 4 MG/2ML IJ SOLN
INTRAMUSCULAR | Status: AC
  Filled 2014-07-31: qty 2

## 2014-07-31 MED ORDER — KETOROLAC TROMETHAMINE 30 MG/ML IJ SOLN
INTRAMUSCULAR | Status: DC | PRN
  Administered 2014-07-31: 30 mg via INTRAVENOUS

## 2014-07-31 MED ORDER — SCOPOLAMINE 1 MG/3DAYS TD PT72
MEDICATED_PATCH | TRANSDERMAL | Status: AC
  Administered 2014-07-31: 1.5 mg via TRANSDERMAL
  Filled 2014-07-31: qty 1

## 2014-07-31 MED ORDER — MEPERIDINE HCL 25 MG/ML IJ SOLN: 6.2500 mg | INTRAMUSCULAR | Status: DC | PRN

## 2014-07-31 MED ORDER — CHLOROPROCAINE HCL 1 % IJ SOLN
INTRAMUSCULAR | Status: DC | PRN
  Administered 2014-07-31: 10 mL

## 2014-07-31 SURGICAL SUPPLY — 17 items
CATH ROBINSON RED A/P 16FR (CATHETERS) ×3 IMPLANT
DECANTER SPIKE VIAL GLASS SM (MISCELLANEOUS) ×3 IMPLANT
GLOVE BIOGEL PI IND STRL 6.5 (GLOVE) ×1 IMPLANT
GLOVE BIOGEL PI INDICATOR 6.5 (GLOVE) ×2
GLOVE SURG SS PI 6.0 STRL IVOR (GLOVE) ×3 IMPLANT
GOWN STRL REUS W/TWL LRG LVL3 (GOWN DISPOSABLE) ×6 IMPLANT
KIT BERKELEY 1ST TRIMESTER 3/8 (MISCELLANEOUS) ×3 IMPLANT
NS IRRIG 1000ML POUR BTL (IV SOLUTION) ×3 IMPLANT
PACK VAGINAL MINOR WOMEN LF (CUSTOM PROCEDURE TRAY) ×3 IMPLANT
PAD OB MATERNITY 4.3X12.25 (PERSONAL CARE ITEMS) ×3 IMPLANT
PAD PREP 24X48 CUFFED NSTRL (MISCELLANEOUS) ×3 IMPLANT
SET BERKELEY SUCTION TUBING (SUCTIONS) ×3 IMPLANT
TOWEL OR 17X24 6PK STRL BLUE (TOWEL DISPOSABLE) ×6 IMPLANT
VACURETTE 10 RIGID CVD (CANNULA) IMPLANT
VACURETTE 7MM CVD STRL WRAP (CANNULA) IMPLANT
VACURETTE 8 RIGID CVD (CANNULA) IMPLANT
VACURETTE 9 RIGID CVD (CANNULA) ×3 IMPLANT

## 2014-07-31 NOTE — OR Nursing (Signed)
Dr Jolayne Panther states pt received rhophylac for neg blood type in office.

## 2014-07-31 NOTE — Transfer of Care (Signed)
Immediate Anesthesia Transfer of Care Note  Patient: Karen Alexander  Procedure(s) Performed: Procedure(s): DILATATION AND EVACUATION (N/A)  Patient Location: PACU  Anesthesia Type:MAC  Level of Consciousness: sedated  Airway & Oxygen Therapy: Patient Spontanous Breathing  Post-op Assessment: Report given to RN  Post vital signs: Reviewed and stable  Last Vitals:  Filed Vitals:   07/31/14 0829  BP: 134/85  Pulse: 93  Temp: 36.9 C  Resp: 16    Complications: No apparent anesthesia complications

## 2014-07-31 NOTE — H&P (Signed)
Karen Alexander is an 29 y.o. female G2P0101 at [redacted]w[redacted]d by LMP diagnosed with a missed abortion on 07/30/2014 presenting today for scheduled dilatation and evacuation. Patient reports persistent vaginal spotting. She denies any abdominal/pelvic pain.   Menstrual History: Patient's last menstrual period was 05/09/2014.    Past Medical History  Diagnosis Date  . Diabetes mellitus     diagnosed at age 36    Past Surgical History  Procedure Laterality Date  . Wisdom tooth extraction  2012    x 4  . Egg retrieval  August    Family History  Problem Relation Age of Onset  . Gout Father   . Hypertension Father   . Diabetes Maternal Grandmother     type2  . Hypothyroidism Paternal Grandmother   . Thyroid disease Paternal Grandmother   . Diabetes Paternal Grandfather     type 2  . Hypertension Paternal Grandfather     Social History:  reports that she has never smoked. She has never used smokeless tobacco. She reports that she does not drink alcohol or use illicit drugs.  Allergies: No Known Allergies  Prescriptions prior to admission  Medication Sig Dispense Refill Last Dose  . loratadine (CLARITIN) 10 MG tablet Take 10 mg by mouth daily.   07/30/2014 at Unknown time  . glucagon (GLUCAGON EMERGENCY) 1 MG injection Inject 1 mg into the vein once as needed (for hypoglycemia).   emergency  . ibuprofen (ADVIL,MOTRIN) 600 MG tablet Take 1 tablet (600 mg total) by mouth every 6 (six) hours as needed for mild pain or moderate pain. (Patient not taking: Reported on 07/31/2014) 30 tablet 2 Taking  . Insulin Human (INSULIN PUMP) SOLN Inject 1 each into the skin 3 times daily with meals, bedtime and 2 AM. humalog   Taking    ROS See pertinent in HPI  Blood pressure 134/85, pulse 93, temperature 98.4 F (36.9 C), temperature source Oral, resp. rate 16, last menstrual period 05/09/2014, SpO2 100 %, not currently breastfeeding. Physical Exam GENERAL: Well-developed, well-nourished female in  no acute distress.  HEENT: Normocephalic, atraumatic. Sclerae anicteric.  NECK: Supple. Normal thyroid.  LUNGS: Clear to auscultation bilaterally.  HEART: Regular rate and rhythm. ABDOMEN: Soft, nontender, nondistended. No organomegaly. PELVIC: Deferred to OR EXTREMITIES: No cyanosis, clubbing, or edema, 2+ distal pulses.  Results for orders placed or performed during the hospital encounter of 07/31/14 (from the past 24 hour(s))  CBC     Status: None   Collection Time: 07/31/14  8:29 AM  Result Value Ref Range   WBC 5.2 4.0 - 10.5 K/uL   RBC 4.32 3.87 - 5.11 MIL/uL   Hemoglobin 12.9 12.0 - 15.0 g/dL   HCT 16.1 09.6 - 04.5 %   MCV 89.1 78.0 - 100.0 fL   MCH 29.9 26.0 - 34.0 pg   MCHC 33.5 30.0 - 36.0 g/dL   RDW 40.9 81.1 - 91.4 %   Platelets 216 150 - 400 K/uL    US Ob Comp Less 14 Wks  07/30/2014   CLINICAL DATA:  Acute onset of vaginal bleeding.  Initial encounter.  EXAM: OBSTETRIC <14 WK [place] AND TRANSVAGINAL OB US  TECHNIQUE: Both transabdominal and transvaginal ultrasound examinations were performed for complete evaluation of the gestation as well as the maternal uterus, adnexal regions, and pelvic cul-de-sac. Transvaginal technique was performed to assess early pregnancy.  COMPARISON:  Pelvic ultrasound performed 04/28/2013  FINDINGS: Intrauterine gestational sac: Visualized/normal in shape.  Yolk sac:  Yes  Embryo:  Yes  Cardiac Activity: No  Heart Rate: N/A  CRL:  2.4 cm   9 w   1 d  Maternal uterus/adnexae: No subchorionic hemorrhage is noted. The uterus is otherwise unremarkable in appearance.  The ovaries are within normal limits. The right ovary measures 3.0 x 1.8 x 2.5 cm, while the left ovary measures 3.2 x 1.7 x 2.4 cm. No suspicious adnexal masses are seen; there is no evidence for ovarian torsion.  Trace free fluid is seen within the pelvic cul-de-sac.  IMPRESSION: Single intrauterine gestational sac noted, with an embryo measuring 2.4 cm in crown-rump length. No evidence of  cardiac activity. This is compatible with fetal demise and missed spontaneous abortion.   Electronically Signed   By: Roanna Raider M.D.   On: 07/30/2014 02:13   US Ob Transvaginal  07/30/2014   CLINICAL DATA:  Acute onset of vaginal bleeding.  Initial encounter.  EXAM: OBSTETRIC <14 WK [place] AND TRANSVAGINAL OB US  TECHNIQUE: Both transabdominal and transvaginal ultrasound examinations were performed for complete evaluation of the gestation as well as the maternal uterus, adnexal regions, and pelvic cul-de-sac. Transvaginal technique was performed to assess early pregnancy.  COMPARISON:  Pelvic ultrasound performed 04/28/2013  FINDINGS: Intrauterine gestational sac: Visualized/normal in shape.  Yolk sac:  Yes  Embryo:  Yes  Cardiac Activity: No  Heart Rate: N/A  CRL:  2.4 cm   9 w   1 d  Maternal uterus/adnexae: No subchorionic hemorrhage is noted. The uterus is otherwise unremarkable in appearance.  The ovaries are within normal limits. The right ovary measures 3.0 x 1.8 x 2.5 cm, while the left ovary measures 3.2 x 1.7 x 2.4 cm. No suspicious adnexal masses are seen; there is no evidence for ovarian torsion.  Trace free fluid is seen within the pelvic cul-de-sac.  IMPRESSION: Single intrauterine gestational sac noted, with an embryo measuring 2.4 cm in crown-rump length. No evidence of cardiac activity. This is compatible with fetal demise and missed spontaneous abortion.   Electronically Signed   By: Roanna Raider M.D.   On: 07/30/2014 02:13    Assessment/Plan: [name] with a 9 week missed abortion here for dilatation and evacuation - Risks, benefits and alternatives were reviewed with the patient, including but not limited to risks of bleeding, infection, uterine perforation and damage to adjacent organs. Patient verbalized understanding and all questions were answered.  Karen Alexander 07/31/2014, 8:55 AM

## 2014-07-31 NOTE — Discharge Instructions (Signed)
Dilation and Curettage or Vacuum Curettage, Care After °Refer to this sheet in the next few weeks. These instructions provide you with information on caring for yourself after your procedure. Your health care provider may also give you more specific instructions. Your treatment has been planned according to current medical practices, but problems sometimes occur. Call your health care provider if you have any problems or questions after your procedure. °WHAT TO EXPECT AFTER THE PROCEDURE °After your procedure, it is typical to have light cramping and bleeding. This may last for 2 days to 2 weeks after the procedure. °HOME CARE INSTRUCTIONS  °· Do not drive for 24 hours. °· Wait 1 week before returning to strenuous activities. °· Take your temperature 2 times a day for 4 days and write it down. Provide these temperatures to your health care provider if you develop a fever. °· Avoid long periods of standing. °· Avoid heavy lifting, pushing, or pulling. Do not lift anything heavier than 10 pounds (4.5 kg). °· Limit stair climbing to once or twice a day. °· Take rest periods often. °· You may resume your usual diet. °· Drink enough fluids to keep your urine clear or pale yellow. °· Your usual bowel function should return. If you have constipation, you may: °¨ Take a mild laxative with permission from your health care provider. °¨ Add fruit and bran to your diet. °¨ Drink more fluids. °· Take showers instead of baths until your health care provider gives you permission to take baths. °· Do not go swimming or use a hot tub until your health care provider approves. °· Try to have someone with you or available to you the first 24-48 hours, especially if you were given a general anesthetic. °· Do not douche, use tampons, or have sex (intercourse) for 2 weeks after the procedure. °· Only take over-the-counter or prescription medicines as directed by your health care provider. Do not take aspirin. It can cause  bleeding. °· Follow up with your health care provider as directed. °SEEK MEDICAL CARE IF:  °· You have increasing cramps or pain that is not relieved with medicine. °· You have abdominal pain that does not seem to be related to the same area of earlier cramping and pain. °· You have bad smelling vaginal discharge. °· You have a rash. °· You are having problems with any medicine. °SEEK IMMEDIATE MEDICAL CARE IF:  °· You have bleeding that is heavier than a normal menstrual period. °· You have a fever. °· You have chest pain. °· You have shortness of breath. °· You feel dizzy or feel like fainting. °· You pass out. °· You have pain in your shoulder strap area. °· You have heavy vaginal bleeding with or without blood clots. °MAKE SURE YOU:  °· Understand these instructions. °· Will watch your condition. °· Will get help right away if you are not doing well or get worse. °Document Released: 02/01/2000 Document Revised: 02/08/2013 Document Reviewed: 09/02/2012 °[employer]® Patient Information ©2015 [employer]. This information is not intended to replace advice given to you by your health care provider. Make sure you discuss any questions you have with your health care provider. °DISCHARGE INSTRUCTIONS: D&C / D&E °The following instructions have been prepared to help you care for yourself upon your return home. °  °Personal hygiene: °• Use sanitary pads for vaginal drainage, not tampons. °• Shower the day after your procedure. °• NO tub baths, pools or Jacuzzis for 2-3 weeks. °• Wipe front to back   after using the bathroom. ° °Activity and limitations: °• Do NOT drive or operate any equipment for 24 hours. The effects of anesthesia are still present and drowsiness may result. °• Do NOT rest in bed all day. °• Walking is encouraged. °• Walk up and down stairs slowly. °• You may resume your normal activity in one to two days or as indicated by your physician. ° °Sexual activity: NO intercourse for at least 2 weeks after the  procedure, or as indicated by your physician. ° °Diet: Eat a light meal as desired this evening. You may resume your usual diet tomorrow. ° °Return to work: You may resume your work activities in one to two days or as indicated by your doctor. ° °What to expect after your surgery: Expect to have vaginal bleeding/discharge for 2-3 days and spotting for up to 10 days. It is not unusual to have soreness for up to 1-2 weeks. You may have a slight burning sensation when you urinate for the first day. Mild cramps may continue for a couple of days. You may have a regular period in 2-6 weeks. ° °Call your doctor for any of the following: °• Excessive vaginal bleeding, saturating and changing one pad every hour. °• Inability to urinate 6 hours after discharge from hospital. °• Pain not relieved by pain medication. °• Fever of 100.4° F or greater. °• Unusual vaginal discharge or odor. ° ° Call for an appointment:  ° ° °Patient’s signature: ______________________ ° °Nurse’s signature ________________________ ° °Support person's signature_______________________ ° ° ° °

## 2014-07-31 NOTE — Op Note (Signed)
Karen Alexander PROCEDURE DATE: 07/31/2014  PREOPERATIVE DIAGNOSIS: 9 week missed abortion. POSTOPERATIVE DIAGNOSIS: The same. PROCEDURE:     Dilation and Evacuation. SURGEON:  Dr. Catalina Antigua  INDICATIONS: 28 y.o. G2P0101with MAB at [redacted] weeks gestation, needing surgical completion.  Risks of surgery were discussed with the patient including but not limited to: bleeding which may require transfusion; infection which may require antibiotics; injury to uterus or surrounding organs;need for additional procedures including laparotomy or laparoscopy; possibility of intrauterine scarring which may impair future fertility; and other postoperative/anesthesia complications. Written informed consent was obtained.    FINDINGS:  A 10 week size anteverted uterus, moderate amounts of products of conception, specimen sent to pathology.  ANESTHESIA:    Monitored intravenous sedation, paracervical block. INTRAVENOUS FLUIDS:  600 ml of LR ESTIMATED BLOOD LOSS:  Less than 50 ml. SPECIMENS:  Products of conception sent to pathology COMPLICATIONS:  None immediate.  PROCEDURE DETAILS:  The patient received intravenous antibiotics while in the preoperative area.  She was then taken to the operating room where general anesthesia was administered and was found to be adequate.  After an adequate timeout was performed, she was placed in the dorsal lithotomy position and examined; then prepped and draped in the sterile manner.   Her bladder was catheterized for an unmeasured amount of clear, yellow urine. A vaginal speculum was then placed in the patient's vagina and a single tooth tenaculum was applied to the anterior lip of the cervix.  A paracervical block using 0.5% Marcaine was administered. The cervix was gently dilated to accommodate a 9 mm suction curette that was gently advanced to the uterine fundus.  The suction device was then activated and curette slowly rotated to clear the uterus of products of conception.  A  sharp curettage was then performed to confirm complete emptying of the uterus. There was minimal bleeding noted and the tenaculum removed with good hemostasis noted.   All instruments were removed from the patient's vagina. The patient tolerated the procedure well and was taken to the recovery area awake, and in stable condition.  The patient will be discharged to home as per PACU criteria.  Routine postoperative instructions given.  She was prescribed Percocet, Ibuprofen and Colace.  She will follow up with our [facility] office in 2 weeks for postoperative evaluation.

## 2014-07-31 NOTE — Anesthesia Postprocedure Evaluation (Signed)
  Anesthesia Post-op Note  Patient: Karen Alexander  Procedure(s) Performed: Procedure(s): DILATATION AND EVACUATION (N/A)  Patient Location: PACU  Anesthesia Type:MAC  Level of Consciousness: awake, alert  and oriented  Airway and Oxygen Therapy: Patient Spontanous Breathing  Post-op Pain: none  Post-op Assessment: Post-op Vital signs reviewed, Patient's Cardiovascular Status Stable, Respiratory Function Stable, Patent Airway, No signs of Nausea or vomiting and Pain level controlled              Post-op Vital Signs: Reviewed and stable  Last Vitals:  Filed Vitals:   07/31/14 1000  BP: 103/62  Pulse: 58  Temp:   Resp: 16    Complications: No apparent anesthesia complications

## 2014-08-01 ENCOUNTER — Encounter (HOSPITAL_COMMUNITY): Payer: Self-pay | Admitting: Obstetrics and Gynecology

## 2014-08-07 ENCOUNTER — Telehealth: Payer: Self-pay

## 2014-08-07 NOTE — Telephone Encounter (Signed)
Diabetic Bundle. Pt stated she had her A1C done 2 months ago at her Endocrinologist office. Pt states she is going to contact them and have her A1C results faxed to Dr. Dayton Martes.

## 2014-08-11 ENCOUNTER — Encounter: Payer: 59 | Admitting: Obstetrics & Gynecology

## 2014-08-14 ENCOUNTER — Encounter: Payer: Self-pay | Admitting: Obstetrics and Gynecology

## 2014-08-14 ENCOUNTER — Ambulatory Visit: Payer: 59 | Admitting: Obstetrics and Gynecology

## 2014-08-14 ENCOUNTER — Ambulatory Visit (INDEPENDENT_AMBULATORY_CARE_PROVIDER_SITE_OTHER): Payer: 59 | Admitting: Obstetrics and Gynecology

## 2014-08-14 VITALS — BP 133/86 | HR 94 | Resp 16 | Ht 68.0 in | Wt 142.0 lb

## 2014-08-14 DIAGNOSIS — Z9889 Other specified postprocedural states: Secondary | ICD-10-CM

## 2014-08-14 DIAGNOSIS — O021 Missed abortion: Secondary | ICD-10-CM

## 2014-08-14 NOTE — Progress Notes (Signed)
Patient ID: Karen Alexander, female   DOB: [birth year], 29 y.o.   MRN: [id number] 29 yo G2P0101 s/p D&C for the treatment of a missed Ab presenting today for postop check. Patient reports feeling well since the procedure and denies any heavy vaginal bleeding, cramping or fever  Past Medical History  Diagnosis Date  . Diabetes mellitus     diagnosed at age 89   Past Surgical History  Procedure Laterality Date  . Wisdom tooth extraction  2012    x 4  . Egg retrieval  August  . Dilation and evacuation N/A 07/31/2014    Procedure: DILATATION AND EVACUATION;  Surgeon: Catalina AntiguaPeggy Martena Emanuele, MD;  Location: [facility];  Service: Gynecology;  Laterality: N/A;  \ Family History  Problem Relation Age of Onset  . Gout Father   . Hypertension Father   . Diabetes Maternal Grandmother     type2  . Hypothyroidism Paternal Grandmother   . Thyroid disease Paternal Grandmother   . Diabetes Paternal Grandfather     type 2  . Hypertension Paternal Grandfather    History  Substance Use Topics  . Smoking status: Never Smoker   . Smokeless tobacco: Never Used  . Alcohol Use: No   ROS See pertinent in HPI  Blood pressure 133/86, pulse 94, resp. rate 16, height 5\' 8"  (1.727 m), weight 142 lb (64.411 kg), last menstrual period 05/09/2014, not currently breastfeeding.   GENERAL: Well-developed, well-nourished female in no acute distress.  ABDOMEN: Soft, nontender, nondistended. No organomegaly. PELVIC: Normal external female genitalia. Vagina is pink and rugated.  Normal discharge. Normal appearing cervix. Uterus is normal in size. No adnexal mass or tenderness. EXTREMITIES: No cyanosis, clubbing, or edema, 2+ distal pulses.  A/P 29 yo s/p D&E for the treatment of a missed Ab - Pathology results reviewed with the patient - patient is medically cleared to resume all activities of daily living - This was a planned pregnancy and patient desires to conceive again. This was conceived via IVF - Instructed to  follow up with PCP to ensure diabetes is under good control and to start taking PNV

## 2014-08-15 ENCOUNTER — Ambulatory Visit: Payer: 59 | Admitting: Family Medicine

## 2014-08-15 LAB — HCG, QUANTITATIVE, PREGNANCY: hCG, Beta Chain, Quant, S: 94.3 m[IU]/mL

## 2014-08-16 ENCOUNTER — Encounter: Payer: Self-pay | Admitting: *Deleted

## 2014-08-25 ENCOUNTER — Other Ambulatory Visit (INDEPENDENT_AMBULATORY_CARE_PROVIDER_SITE_OTHER): Payer: 59 | Admitting: *Deleted

## 2014-08-25 DIAGNOSIS — O039 Complete or unspecified spontaneous abortion without complication: Secondary | ICD-10-CM

## 2014-08-25 NOTE — Progress Notes (Signed)
Pt came in today for a Beta HCG.  

## 2014-08-26 LAB — HCG, QUANTITATIVE, PREGNANCY: hCG, Beta Chain, Quant, S: 18 m[IU]/mL

## 2014-09-05 ENCOUNTER — Other Ambulatory Visit (INDEPENDENT_AMBULATORY_CARE_PROVIDER_SITE_OTHER): Payer: 59 | Admitting: *Deleted

## 2014-09-05 DIAGNOSIS — O039 Complete or unspecified spontaneous abortion without complication: Secondary | ICD-10-CM

## 2014-09-06 LAB — HCG, QUANTITATIVE, PREGNANCY: HCG, BETA CHAIN, QUANT, S: 7.8 m[IU]/mL

## 2014-09-08 ENCOUNTER — Ambulatory Visit (INDEPENDENT_AMBULATORY_CARE_PROVIDER_SITE_OTHER): Payer: Self-pay | Admitting: Family Medicine

## 2014-09-08 ENCOUNTER — Ambulatory Visit: Payer: 59 | Admitting: Pharmacist

## 2014-09-08 VITALS — BP 106/66 | Ht 68.0 in | Wt 139.0 lb

## 2014-09-08 DIAGNOSIS — E109 Type 1 diabetes mellitus without complications: Secondary | ICD-10-CM

## 2014-09-08 NOTE — Progress Notes (Signed)
Subjective:  Patient presents today for 6 month diabetes follow-up as part of the employer-sponsored Link to Wellness program.  Current diabetes regimen includes Humalog via insulin pump. No med changes at this time.   Patient misscarried at 11 weeks about a month ago. She states that she is doing better now and is feeling back to normal.   Last appointment with Dr. Tedd Sias was in May and A1C was 6.3%. She has a pending appointment to see her next month in August.   Assessment/Plan:  Patient is a 29 y.o. female with DM . Most recent A1C was 6.3 % which is at goal of less than 7%. Weight is slightly increased from last visit with me. However, since her last appointment with me in January she was pregnant for 11 weeks before miscarrying. Weight is only 4 pounds higher than her last visit.   CBG Review: Patient continues to test 5-7 times daily. High/Low- 241/42. I downloaded patient's meter to [facility]. The majority of days CBGs are between 100-180 mg/dL. She is having occasional hypoglycemia- 1-2 times a week. CBG averages throughout the day range between 130-160 mg/dL.   Lifestyle improvements:  Physical Activity-  She is walking daily for 30 minutes with her family.   Nutrition-  No major changes from last visit. She continues to count carbohydrates.    Follow up with me in 6 months.    Goals for Next Visit:  1. Weight loss- aim to lose 4 pounds before next appointment. Continue to monitor portion sizes and continue physical activity.     Next appointment to see me is: January 20th at 9 AM.    Aundra Millet D. Vivia Ewing, PharmD, BCPS, CDE [facility] [place] to [facility] Clinical Pharmacist & Diabetes Care Coordinator [phone]

## 2014-09-12 NOTE — Progress Notes (Signed)
ATTENDING PHYSICIAN NOTE: I have reviewed the chart and agree with the plan as detailed above. Jayden Kratochvil MD Pager [phone]  

## 2014-09-15 ENCOUNTER — Other Ambulatory Visit (INDEPENDENT_AMBULATORY_CARE_PROVIDER_SITE_OTHER): Payer: 59 | Admitting: *Deleted

## 2014-09-15 DIAGNOSIS — O039 Complete or unspecified spontaneous abortion without complication: Secondary | ICD-10-CM | POA: Diagnosis not present

## 2014-09-15 NOTE — Progress Notes (Signed)
Patient here today for a Beta HCG.  

## 2014-09-16 LAB — HCG, QUANTITATIVE, PREGNANCY: hCG, Beta Chain, Quant, S: 4.8 m[IU]/mL

## 2015-02-13 ENCOUNTER — Encounter: Payer: Self-pay | Admitting: *Deleted

## 2015-02-13 ENCOUNTER — Encounter: Payer: Self-pay | Admitting: Family Medicine

## 2015-02-21 DIAGNOSIS — E109 Type 1 diabetes mellitus without complications: Secondary | ICD-10-CM | POA: Diagnosis not present

## 2015-02-23 ENCOUNTER — Ambulatory Visit (INDEPENDENT_AMBULATORY_CARE_PROVIDER_SITE_OTHER): Payer: 59 | Admitting: Obstetrics & Gynecology

## 2015-02-23 ENCOUNTER — Encounter: Payer: Self-pay | Admitting: Obstetrics & Gynecology

## 2015-02-23 VITALS — BP 123/82 | HR 118 | Wt 146.0 lb

## 2015-02-23 DIAGNOSIS — O09899 Supervision of other high risk pregnancies, unspecified trimester: Secondary | ICD-10-CM | POA: Diagnosis not present

## 2015-02-23 DIAGNOSIS — Z113 Encounter for screening for infections with a predominantly sexual mode of transmission: Secondary | ICD-10-CM

## 2015-02-23 DIAGNOSIS — O09891 Supervision of other high risk pregnancies, first trimester: Secondary | ICD-10-CM | POA: Diagnosis not present

## 2015-02-23 DIAGNOSIS — Z3A10 10 weeks gestation of pregnancy: Secondary | ICD-10-CM

## 2015-02-23 DIAGNOSIS — O24414 Gestational diabetes mellitus in pregnancy, insulin controlled: Secondary | ICD-10-CM

## 2015-02-23 NOTE — Progress Notes (Signed)
   Subjective:    Karen Alexander is a MW [id number] 5182w1d being seen today for her first obstetrical visit.  Her obstetrical history is significant for Type 1 DM, on a pump. Recent hemoglobin A1c 6.6 and adjustments made... Patient does intend to breast feed. Pregnancy history fully reviewed.  Patient reports no complaints.  Filed Vitals:   02/23/15 1015  BP: 123/82  Pulse: 118  Weight: 146 lb (66.225 kg)    HISTORY: OB History  Gravida Para Term Preterm AB SAB TAB Ectopic Multiple Living  3 1 0 1 1 1 0 0 0 1     # Outcome Date GA Lbr Len/2nd Weight Sex Delivery Anes PTL Lv  3 Current           2 Preterm 05/27/13 5553w6d 02:39 / 00:50 9 lb 1.3 oz (4.12 kg) M Vag-Spont EPI  Y  1 SAB              Past Medical History  Diagnosis Date  . Diabetes mellitus     diagnosed at age 30   Past Surgical History  Procedure Laterality Date  . Wisdom tooth extraction  2012    x 4  . Egg retrieval  August  . Dilation and evacuation N/A 07/31/2014    Procedure: DILATATION AND EVACUATION;  Surgeon: Catalina AntiguaPeggy Constant, MD;  Location: [facility];  Service: Gynecology;  Laterality: N/A;   Family History  Problem Relation Age of Onset  . Gout Father   . Hypertension Father   . Diabetes Maternal Grandmother     type2  . Hypothyroidism Paternal Grandmother   . Thyroid disease Paternal Grandmother   . Diabetes Paternal Grandfather     type 2  . Hypertension Paternal Grandfather      Exam    Uterus:     Pelvic Exam:    Perineum: No Hemorrhoids   Vulva: normal   Vagina:  normal mucosa   pH:    Cervix: anteverted   Adnexa: normal adnexa   Bony Pelvis: android  System: Breast:  normal appearance, no masses or tenderness   Skin: normal coloration and turgor, no rashes    Neurologic: oriented   Extremities: normal strength, tone, and muscle mass   HEENT PERRLA   Mouth/Teeth mucous membranes moist, pharynx normal without lesions   Neck supple   Cardiovascular: regular rate and rhythm   Respiratory:  appears well, vitals normal, no respiratory distress, acyanotic, normal RR, ear and throat exam is normal, neck free of mass or lymphadenopathy, chest clear, no wheezing, crepitations, rhonchi, normal symmetric air entry   Abdomen: soft, non-tender; bowel sounds normal; no masses,  no organomegaly   Urinary: urethral meatus normal      Assessment:    Pregnancy: [id number] Patient Active Problem List   Diagnosis Date Noted  . Supervision of other high risk pregnancies, unspecified trimester 02/23/2015  . Abortion, missed   . 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 DIAB W/O COMP TYPE I [JUV] NOT STATED UNCNTRL 01/30/2010  . ALLERGIC RHINITIS 01/30/2010        Plan:     Initial labs drawn. Prenatal vitamins. Problem list reviewed and updated. Genetic Screening discussed and she declines  Ultrasound discussed; fetal survey: requested.  Follow up in 4 weeks.  Danarius Mcconathy C. 02/23/2015

## 2015-02-23 NOTE — Addendum Note (Signed)
Addended by: Tandy GawHINTON, Azelyn Batie C on: 02/23/2015 10:40 AM   Modules accepted: Orders

## 2015-02-23 NOTE — Progress Notes (Signed)
Bedside ultrasound today shows 4670w1d fetus with heartbeat.

## 2015-02-25 LAB — CULTURE, OB URINE
Colony Count: NO GROWTH
Organism ID, Bacteria: NO GROWTH

## 2015-02-26 LAB — URINE CYTOLOGY ANCILLARY ONLY
Chlamydia: NEGATIVE
NEISSERIA GONORRHEA: NEGATIVE

## 2015-02-27 LAB — PRENATAL PROFILE (SOLSTAS)
ANTIBODY SCREEN: NEGATIVE
BASOS PCT: 1 % (ref 0–1)
Basophils Absolute: 0.1 10*3/uL (ref 0.0–0.1)
EOS ABS: 0.1 10*3/uL (ref 0.0–0.7)
Eosinophils Relative: 1 % (ref 0–5)
HCT: 37.2 % (ref 36.0–46.0)
HEMOGLOBIN: 12.4 g/dL (ref 12.0–15.0)
HIV 1&2 Ab, 4th Generation: NONREACTIVE
Hepatitis B Surface Ag: NEGATIVE
Lymphocytes Relative: 18 % (ref 12–46)
Lymphs Abs: 1.3 10*3/uL (ref 0.7–4.0)
MCH: 29.9 pg (ref 26.0–34.0)
MCHC: 33.3 g/dL (ref 30.0–36.0)
MCV: 89.6 fL (ref 78.0–100.0)
MONO ABS: 0.7 10*3/uL (ref 0.1–1.0)
MONOS PCT: 10 % (ref 3–12)
MPV: 9.9 fL (ref 8.6–12.4)
Neutro Abs: 4.9 10*3/uL (ref 1.7–7.7)
Neutrophils Relative %: 70 % (ref 43–77)
Platelets: 279 10*3/uL (ref 150–400)
RBC: 4.15 MIL/uL (ref 3.87–5.11)
RDW: 13.4 % (ref 11.5–15.5)
RUBELLA: 3.18 {index} — AB (ref <0.90)
Rh Type: NEGATIVE
WBC: 7 10*3/uL (ref 4.0–10.5)

## 2015-03-07 DIAGNOSIS — E109 Type 1 diabetes mellitus without complications: Secondary | ICD-10-CM | POA: Diagnosis not present

## 2015-03-09 ENCOUNTER — Ambulatory Visit (INDEPENDENT_AMBULATORY_CARE_PROVIDER_SITE_OTHER): Payer: Self-pay | Admitting: Family Medicine

## 2015-03-09 ENCOUNTER — Encounter: Payer: Self-pay | Admitting: Pharmacist

## 2015-03-09 VITALS — BP 106/68 | Wt 145.0 lb

## 2015-03-09 DIAGNOSIS — E109 Type 1 diabetes mellitus without complications: Secondary | ICD-10-CM

## 2015-03-09 NOTE — Progress Notes (Signed)
Subjective:  Patient presents today for a 6 month diabetes follow-up as part of the employer-sponsored Link to Wellness program.  Current diabetes regimen includes Humalog via insulin pump.    Patient is now [redacted] weeks pregnant. She is still seeing Dr. Tedd Sias with [facility] in [place]. Last A1C was 6.6% in December. She has a follow up appointment coming up on Tuesday with Dr. Tedd Sias. While pregnant Dr. Tedd Sias prefers to keep A1C around 6% and she is seen monthly for follow up.   Patient states that she has been having some lows lately. The lowes was 42 a few weeks ago. Normally it is in the 50-60s.   Assessment:  Diabetes: Most recent A1C was  6.6 % which is above goal of 6%. Weight is increased from last visit with me, but she is now pregnant.    CBG Review: Patient is checking 7-8 times daily now that she is pregnant.   She is having lows 4-5 times a week- usually first thing in the morning. She keeps glucose tablets next to her bed and she corrects with 3 glucose tablets when she is low. Bedtime readings are usually running 140-150.   Averages throughout the day are usually less than 140 mg/dL, with the exception of late afternoon and evening when it is a little higher- around 150-160. She admits that her schedule is not as regular in the evenings and this is probably why it is higher.   Lifestyle improvements:  Physical Activity-  She is walking 5 days a week for 15-30 minutes and she is walking stairs at work and then at home.   Nutrition-  No major changes since last time.  Follow up with me in 3 months.    Plan/Goals for Next Visit:  1. Work to keep A1C as close to 6 as possible. Keep checking your blood sugar 7-8 times daily. Respond to any highs that you may have.  2. Continue physical activity during the week. Aim for walking 4-5 days a week.     Next appointment to see me is: Friday June 23rd at 9 AM.    Ellender Hose. Vivia Ewing, PharmD, BCPS, CDE [facility] [place] to [facility]  Clinical Pharmacist & Diabetes Care Coordinator [phone]

## 2015-03-13 DIAGNOSIS — O24012 Pre-existing diabetes mellitus, type 1, in pregnancy, second trimester: Secondary | ICD-10-CM | POA: Diagnosis not present

## 2015-03-13 DIAGNOSIS — Z4681 Encounter for fitting and adjustment of insulin pump: Secondary | ICD-10-CM | POA: Diagnosis not present

## 2015-03-13 DIAGNOSIS — E109 Type 1 diabetes mellitus without complications: Secondary | ICD-10-CM | POA: Diagnosis not present

## 2015-03-23 ENCOUNTER — Encounter: Payer: Self-pay | Admitting: Obstetrics & Gynecology

## 2015-03-23 ENCOUNTER — Ambulatory Visit (INDEPENDENT_AMBULATORY_CARE_PROVIDER_SITE_OTHER): Payer: 59 | Admitting: Obstetrics & Gynecology

## 2015-03-23 VITALS — BP 123/81 | HR 109 | Wt 151.0 lb

## 2015-03-23 DIAGNOSIS — E109 Type 1 diabetes mellitus without complications: Secondary | ICD-10-CM

## 2015-03-23 DIAGNOSIS — O24312 Unspecified pre-existing diabetes mellitus in pregnancy, second trimester: Secondary | ICD-10-CM

## 2015-03-23 DIAGNOSIS — O24912 Unspecified diabetes mellitus in pregnancy, second trimester: Secondary | ICD-10-CM | POA: Diagnosis not present

## 2015-03-23 DIAGNOSIS — E119 Type 2 diabetes mellitus without complications: Secondary | ICD-10-CM | POA: Diagnosis not present

## 2015-03-23 DIAGNOSIS — O09899 Supervision of other high risk pregnancies, unspecified trimester: Secondary | ICD-10-CM | POA: Diagnosis not present

## 2015-03-23 DIAGNOSIS — O24319 Unspecified pre-existing diabetes mellitus in pregnancy, unspecified trimester: Secondary | ICD-10-CM | POA: Insufficient documentation

## 2015-03-23 DIAGNOSIS — O26899 Other specified pregnancy related conditions, unspecified trimester: Secondary | ICD-10-CM | POA: Insufficient documentation

## 2015-03-23 DIAGNOSIS — Z6791 Unspecified blood type, Rh negative: Secondary | ICD-10-CM | POA: Insufficient documentation

## 2015-03-23 NOTE — Progress Notes (Signed)
I have reviewed this pharmacist's note and agree  

## 2015-03-23 NOTE — Patient Instructions (Signed)
Return to clinic for any obstetric concerns or go to [facility] for evaluation  

## 2015-03-23 NOTE — Progress Notes (Signed)
Subjective:  Karen Alexander is a 30 y.o. [phone] at [redacted]w[redacted]d being seen today for ongoing prenatal care.  She is currently monitored for the following issues for this high-risk pregnancy and has Type 1 diabetes mellitus (HCC); Infertility female; Supervision of other high risk pregnancies, unspecified trimester; and Preexisting diabetes complicating pregnancy, antepartum on her problem list.  Patient reports no complaints.  Contractions: Not present. Vag. Bleeding: None.  Movement: Absent. Denies leaking of fluid.   The following portions of the patient's history were reviewed and updated as appropriate: allergies, current medications, past family history, past medical history, past social history, past surgical history and problem list. Problem list updated.  Objective:   Filed Vitals:   03/23/15 0929  BP: 123/81  Pulse: 109  Weight: 151 lb (68.493 kg)    Fetal Status: Fetal Heart Rate (bpm): 155   Movement: Absent     General:  Alert, oriented and cooperative. Patient is in no acute distress.  Skin: Skin is warm and dry. No rash noted.   Cardiovascular: Normal heart rate noted  Respiratory: Normal respiratory effort, no problems with respiration noted  Abdomen: Soft, gravid, appropriate for gestational age. Pain/Pressure: Absent     Pelvic: Vag. Bleeding: None Vag D/C Character: Thin  Cervical exam deferred        Extremities: Normal range of motion.  Edema: None  Mental Status: Normal mood and affect. Normal behavior. Normal judgment and thought content.   Urinalysis: Urine Protein: Negative Urine Glucose: 1+  Assessment and Plan:  Pregnancy: A5W0981 at [redacted]w[redacted]d  1. Preexisting diabetes complicating pregnancy, antepartum, second trimester 2. Type 1 diabetes mellitus without complication (HCC) Class B-Type 1-on insulin pump-managed by Endocrinology - Dr. Tedd Sias with [facility] in [place]. Last A1C was 6.6% in December.  While pregnant Dr. Tedd Sias prefers to keep A1C around 6% and  she is seen monthly for follow up. - [place] MFM OB DETAIL +14 WK; Future - Protein / creatinine ratio, urine - US Fetal Echocardiography; Future  Baseline labs (CBC - normal, CMP, urine Pr:Cr - checked today,TSH 2.2 12/2014, HgA1C - 6.6% 01/2015).  Dr. Tedd Sias to add on CMET to labs during next visit in 2 weeks, will also recheck HgA1C and TSH then.   Aspirin 81 mg daily prescribed after 12 weeks - patient has at home, will start taking today  Fetal ECHO - ordered  Eye exam - normal, no retinopathy in 01/2015  Serial growth scans 20-24-28-32-35-38 - anatomy scan ordered  Antenatal testing starting at 32 weeks  Delivery by 39 weeks or earlier if needed Discussed implications of DM in pregnancy, need for optimizing glycemic control to decrease DM associated maternal-fetal morbidity and mortality, need for antenatal testing and frequent ultrasounds/prenatal visits. Will check baseline labs today, get DM education and DM testing supplies today.  3. Supervision of other high risk pregnancies, unspecified trimester No other complaints or concerns.  Routine obstetric precautions reviewed. Declined genetic testing. Please refer to After Visit Summary for other counseling recommendations.  Return in about 4 weeks (around 04/20/2015) for OB Visit.   Tereso Newcomer, MD

## 2015-03-24 LAB — PROTEIN / CREATININE RATIO, URINE
Creatinine, Urine: 131 mg/dL (ref 20–320)
Protein Creatinine Ratio: 99 mg/g creat (ref 21–161)
Total Protein, Urine: 13 mg/dL (ref 5–24)

## 2015-04-05 DIAGNOSIS — E109 Type 1 diabetes mellitus without complications: Secondary | ICD-10-CM | POA: Diagnosis not present

## 2015-04-13 DIAGNOSIS — Z4681 Encounter for fitting and adjustment of insulin pump: Secondary | ICD-10-CM | POA: Diagnosis not present

## 2015-04-13 DIAGNOSIS — O24012 Pre-existing diabetes mellitus, type 1, in pregnancy, second trimester: Secondary | ICD-10-CM | POA: Diagnosis not present

## 2015-04-17 ENCOUNTER — Encounter (HOSPITAL_COMMUNITY): Payer: Self-pay | Admitting: Obstetrics & Gynecology

## 2015-04-20 ENCOUNTER — Ambulatory Visit (INDEPENDENT_AMBULATORY_CARE_PROVIDER_SITE_OTHER): Payer: 59 | Admitting: Obstetrics & Gynecology

## 2015-04-20 VITALS — BP 114/74 | HR 101 | Wt 159.0 lb

## 2015-04-20 DIAGNOSIS — O24912 Unspecified diabetes mellitus in pregnancy, second trimester: Secondary | ICD-10-CM | POA: Diagnosis not present

## 2015-04-20 DIAGNOSIS — O09899 Supervision of other high risk pregnancies, unspecified trimester: Secondary | ICD-10-CM | POA: Diagnosis not present

## 2015-04-20 DIAGNOSIS — O24312 Unspecified pre-existing diabetes mellitus in pregnancy, second trimester: Secondary | ICD-10-CM

## 2015-04-20 DIAGNOSIS — E109 Type 1 diabetes mellitus without complications: Secondary | ICD-10-CM

## 2015-04-20 NOTE — Patient Instructions (Signed)
Return to clinic for any obstetric concerns or go to [facility] for evaluation  

## 2015-04-20 NOTE — Progress Notes (Signed)
Subjective:  Karen Alexander is a 30 y.o. [phone] at 1762w1d being seen today for ongoing prenatal care.  She is currently monitored for the following issues for this high-risk pregnancy and has Type 1 diabetes mellitus (HCC); Infertility female; Supervision of other high risk pregnancies, unspecified trimester; Preexisting diabetes complicating pregnancy, antepartum; and Rh negative, antepartum on her problem list.  Patient reports no complaints.  Contractions: Not present. Vag. Bleeding: None.  Movement: Present. Denies leaking of fluid.   The following portions of the patient's history were reviewed and updated as appropriate: allergies, current medications, past family history, past medical history, past social history, past surgical history and problem list. Problem list updated.  Objective:   Filed Vitals:   04/20/15 0930  BP: 114/74  Pulse: 101  Weight: 159 lb (72.122 kg)    Fetal Status: Fetal Heart Rate (bpm): 152   Movement: Present     General:  Alert, oriented and cooperative. Patient is in no acute distress.  Skin: Skin is warm and dry. No rash noted.   Cardiovascular: Normal heart rate noted  Respiratory: Normal respiratory effort, no problems with respiration noted  Abdomen: Soft, gravid, appropriate for gestational age. Pain/Pressure: Absent     Pelvic: Vag. Bleeding: None    Cervical exam deferred        Extremities: Normal range of motion.  Edema: None  Mental Status: Normal mood and affect. Normal behavior. Normal judgment and thought content.   Urinalysis: Urine Protein: Negative Urine Glucose: Negative  Assessment and Plan:  Pregnancy: [id number] at 4462w1d  1. Type 1 diabetes mellitus without complication (HCC) 2. Preexisting diabetes complicating pregnancy, antepartum, second trimester Class B-Type 1-on insulin pump-managed by Endocrinology - [facility] [place]. Last A1C was 5.8% on 04/05/15. While pregnant Dr. Tedd SiasSolum prefers to keep A1C  around 6% and she is seen monthly for follow up.  Normal TSH. Will continue to follow Dr. Pricilla HandlerSolum's recommendations.  3. Supervision of other high risk pregnancies, unspecified trimester Routine obstetric precautions reviewed. Please refer to After Visit Summary for other counseling recommendations.  Return in about 4 weeks (around 05/18/2015) for OB Visit.   Tereso NewcomerUgonna A Anyanwu, MD

## 2015-04-27 ENCOUNTER — Ambulatory Visit (HOSPITAL_COMMUNITY)
Admission: RE | Admit: 2015-04-27 | Discharge: 2015-04-27 | Disposition: A | Discharge status: Home / Self Care | Payer: 59 | Source: Ambulatory Visit | Attending: Obstetrics & Gynecology | Admitting: Obstetrics & Gynecology

## 2015-04-27 ENCOUNTER — Other Ambulatory Visit: Payer: Self-pay | Admitting: Obstetrics & Gynecology

## 2015-04-27 ENCOUNTER — Encounter (HOSPITAL_COMMUNITY): Payer: Self-pay

## 2015-04-27 VITALS — BP 119/72 | HR 67 | Wt 156.2 lb

## 2015-04-27 DIAGNOSIS — O09892 Supervision of other high risk pregnancies, second trimester: Secondary | ICD-10-CM

## 2015-04-27 DIAGNOSIS — O09212 Supervision of pregnancy with history of pre-term labor, second trimester: Secondary | ICD-10-CM | POA: Insufficient documentation

## 2015-04-27 DIAGNOSIS — E109 Type 1 diabetes mellitus without complications: Secondary | ICD-10-CM

## 2015-04-27 DIAGNOSIS — Z3A19 19 weeks gestation of pregnancy: Secondary | ICD-10-CM | POA: Insufficient documentation

## 2015-04-27 DIAGNOSIS — Z3689 Encounter for other specified antenatal screening: Secondary | ICD-10-CM

## 2015-04-27 DIAGNOSIS — O360120 Maternal care for anti-D [Rh] antibodies, second trimester, not applicable or unspecified: Secondary | ICD-10-CM

## 2015-04-27 DIAGNOSIS — O24012 Pre-existing diabetes mellitus, type 1, in pregnancy, second trimester: Secondary | ICD-10-CM | POA: Diagnosis not present

## 2015-04-27 DIAGNOSIS — Z36 Encounter for antenatal screening of mother: Secondary | ICD-10-CM | POA: Diagnosis not present

## 2015-04-27 DIAGNOSIS — O26892 Other specified pregnancy related conditions, second trimester: Secondary | ICD-10-CM | POA: Insufficient documentation

## 2015-04-27 DIAGNOSIS — O09812 Supervision of pregnancy resulting from assisted reproductive technology, second trimester: Secondary | ICD-10-CM | POA: Diagnosis not present

## 2015-04-27 DIAGNOSIS — O24319 Unspecified pre-existing diabetes mellitus in pregnancy, unspecified trimester: Secondary | ICD-10-CM

## 2015-04-27 DIAGNOSIS — O24312 Unspecified pre-existing diabetes mellitus in pregnancy, second trimester: Secondary | ICD-10-CM

## 2015-04-27 DIAGNOSIS — Z6791 Unspecified blood type, Rh negative: Secondary | ICD-10-CM | POA: Insufficient documentation

## 2015-04-27 DIAGNOSIS — O09899 Supervision of other high risk pregnancies, unspecified trimester: Secondary | ICD-10-CM

## 2015-04-30 DIAGNOSIS — O24919 Unspecified diabetes mellitus in pregnancy, unspecified trimester: Secondary | ICD-10-CM | POA: Diagnosis not present

## 2015-05-03 DIAGNOSIS — O24012 Pre-existing diabetes mellitus, type 1, in pregnancy, second trimester: Secondary | ICD-10-CM | POA: Diagnosis not present

## 2015-05-08 ENCOUNTER — Encounter: Payer: Self-pay | Admitting: *Deleted

## 2015-05-11 DIAGNOSIS — E109 Type 1 diabetes mellitus without complications: Secondary | ICD-10-CM | POA: Diagnosis not present

## 2015-05-11 DIAGNOSIS — O24012 Pre-existing diabetes mellitus, type 1, in pregnancy, second trimester: Secondary | ICD-10-CM | POA: Diagnosis not present

## 2015-05-11 DIAGNOSIS — Z4681 Encounter for fitting and adjustment of insulin pump: Secondary | ICD-10-CM | POA: Diagnosis not present

## 2015-05-15 DIAGNOSIS — E109 Type 1 diabetes mellitus without complications: Secondary | ICD-10-CM | POA: Diagnosis not present

## 2015-05-18 ENCOUNTER — Ambulatory Visit (INDEPENDENT_AMBULATORY_CARE_PROVIDER_SITE_OTHER): Payer: 59 | Admitting: Family Medicine

## 2015-05-18 VITALS — BP 119/78 | HR 100 | Wt 161.0 lb

## 2015-05-18 DIAGNOSIS — O24312 Unspecified pre-existing diabetes mellitus in pregnancy, second trimester: Secondary | ICD-10-CM

## 2015-05-18 DIAGNOSIS — O09899 Supervision of other high risk pregnancies, unspecified trimester: Secondary | ICD-10-CM

## 2015-05-18 DIAGNOSIS — O24912 Unspecified diabetes mellitus in pregnancy, second trimester: Secondary | ICD-10-CM

## 2015-05-18 NOTE — Patient Instructions (Signed)
Second Trimester of Pregnancy The second trimester is from week 13 through week 28, months 4 through 6. The second trimester is often a time when you feel your best. Your body has also adjusted to being pregnant, and you begin to feel better physically. Usually, morning sickness has lessened or quit completely, you may have more energy, and you may have an increase in appetite. The second trimester is also a time when the fetus is growing rapidly. At the end of the sixth month, the fetus is about 9 inches long and weighs about 1 pounds. You will likely begin to feel the baby move (quickening) between 18 and 20 weeks of the pregnancy. BODY CHANGES Your body goes through many changes during pregnancy. The changes vary from woman to woman.   Your weight will continue to increase. You will notice your lower abdomen bulging out.  You may begin to get stretch marks on your hips, abdomen, and breasts.  You may develop headaches that can be relieved by medicines approved by your health care provider.  You may urinate more often because the fetus is pressing on your bladder.  You may develop or continue to have heartburn as a result of your pregnancy.  You may develop constipation because certain hormones are causing the muscles that push waste through your intestines to slow down.  You may develop hemorrhoids or swollen, bulging veins (varicose veins).  You may have back pain because of the weight gain and pregnancy hormones relaxing your joints between the bones in your pelvis and as a result of a shift in weight and the muscles that support your balance.  Your breasts will continue to grow and be tender.  Your gums may bleed and may be sensitive to brushing and flossing.  Dark spots or blotches (chloasma, mask of pregnancy) may develop on your face. This will likely fade after the baby is born.  A dark line from your belly button to the pubic area (linea nigra) may appear. This will likely  fade after the baby is born.  You may have changes in your hair. These can include thickening of your hair, rapid growth, and changes in texture. Some women also have hair loss during or after pregnancy, or hair that feels dry or thin. Your hair will most likely return to normal after your baby is born. WHAT TO EXPECT AT YOUR PRENATAL VISITS During a routine prenatal visit:  You will be weighed to make sure you and the fetus are growing normally.  Your blood pressure will be taken.  Your abdomen will be measured to track your baby's growth.  The fetal heartbeat will be listened to.  Any test results from the previous visit will be discussed. Your health care provider may ask you:  How you are feeling.  If you are feeling the baby move.  If you have had any abnormal symptoms, such as leaking fluid, bleeding, severe headaches, or abdominal cramping.  If you are using any tobacco products, including cigarettes, chewing tobacco, and electronic cigarettes.  If you have any questions. Other tests that may be performed during your second trimester include:  Blood tests that check for:  Low iron levels (anemia).  Gestational diabetes (between 24 and 28 weeks).  Rh antibodies.  Urine tests to check for infections, diabetes, or protein in the urine.  An ultrasound to confirm the proper growth and development of the baby.  An amniocentesis to check for possible genetic problems.  Fetal screens for spina bifida   and Down syndrome.  HIV (human immunodeficiency virus) testing. Routine prenatal testing includes screening for HIV, unless you choose not to have this test. HOME CARE INSTRUCTIONS   Avoid all smoking, herbs, alcohol, and unprescribed drugs. These chemicals affect the formation and growth of the baby.  Do not use any tobacco products, including cigarettes, chewing tobacco, and electronic cigarettes. If you need help quitting, ask your health care provider. You may receive  counseling support and other resources to help you quit.  Follow your health care provider's instructions regarding medicine use. There are medicines that are either safe or unsafe to take during pregnancy.  Exercise only as directed by your health care provider. Experiencing uterine cramps is a good sign to stop exercising.  Continue to eat regular, healthy meals.  Wear a good support bra for breast tenderness.  Do not use hot tubs, steam rooms, or saunas.  Wear your seat belt at all times when driving.  Avoid raw meat, uncooked cheese, cat litter boxes, and soil used by cats. These carry germs that can cause birth defects in the baby.  Take your prenatal vitamins.  Take 1500-2000 mg of calcium daily starting at the 20th week of pregnancy until you deliver your baby.  Try taking a stool softener (if your health care provider approves) if you develop constipation. Eat more high-fiber foods, such as fresh vegetables or fruit and whole grains. Drink plenty of fluids to keep your urine clear or pale yellow.  Take warm sitz baths to soothe any pain or discomfort caused by hemorrhoids. Use hemorrhoid cream if your health care provider approves.  If you develop varicose veins, wear support hose. Elevate your feet for 15 minutes, 3-4 times a day. Limit salt in your diet.  Avoid heavy lifting, wear low heel shoes, and practice good posture.  Rest with your legs elevated if you have leg cramps or low back pain.  Visit your dentist if you have not gone yet during your pregnancy. Use a soft toothbrush to brush your teeth and be gentle when you floss.  A sexual relationship may be continued unless your health care provider directs you otherwise.  Continue to go to all your prenatal visits as directed by your health care provider. SEEK MEDICAL CARE IF:   You have dizziness.  You have mild pelvic cramps, pelvic pressure, or nagging pain in the abdominal area.  You have persistent nausea,  vomiting, or diarrhea.  You have a bad smelling vaginal discharge.  You have pain with urination. SEEK IMMEDIATE MEDICAL CARE IF:   You have a fever.  You are leaking fluid from your vagina.  You have spotting or bleeding from your vagina.  You have severe abdominal cramping or pain.  You have rapid weight gain or loss.  You have shortness of breath with chest pain.  You notice sudden or extreme swelling of your face, hands, ankles, feet, or legs.  You have not felt your baby move in over an hour.  You have severe headaches that do not go away with medicine.  You have vision changes.   This information is not intended to replace advice given to you by your health care provider. Make sure you discuss any questions you have with your health care provider.   Document Released: 01/28/2001 Document Revised: 02/24/2014 Document Reviewed: 04/06/2012 Elsevier Interactive Patient Education 2016 [employer]   Breastfeeding Deciding to breastfeed is one of the best choices you can make for you and your baby. A change   in hormones during pregnancy causes your breast tissue to grow and increases the number and size of your milk ducts. These hormones also allow proteins, sugars, and fats from your blood supply to make breast milk in your milk-producing glands. Hormones prevent breast milk from being released before your baby is born as well as prompt milk flow after birth. Once breastfeeding has begun, thoughts of your baby, as well as his or her sucking or crying, can stimulate the release of milk from your milk-producing glands.  BENEFITS OF BREASTFEEDING For Your Baby  Your first milk (colostrum) helps your baby's digestive system function better.  There are antibodies in your milk that help your baby fight off infections.  Your baby has a lower incidence of asthma, allergies, and sudden infant death syndrome.  The nutrients in breast milk are better for your baby than infant  formulas and are designed uniquely for your baby's needs.  Breast milk improves your baby's brain development.  Your baby is less likely to develop other conditions, such as childhood obesity, asthma, or type 2 diabetes mellitus. For You  Breastfeeding helps to create a very special bond between you and your baby.  Breastfeeding is convenient. Breast milk is always available at the correct temperature and costs nothing.  Breastfeeding helps to burn calories and helps you lose the weight gained during pregnancy.  Breastfeeding makes your uterus contract to its prepregnancy size faster and slows bleeding (lochia) after you give birth.   Breastfeeding helps to lower your risk of developing type 2 diabetes mellitus, osteoporosis, and breast or ovarian cancer later in life. SIGNS THAT YOUR BABY IS HUNGRY Early Signs of Hunger  Increased alertness or activity.  Stretching.  Movement of the head from side to side.  Movement of the head and opening of the mouth when the corner of the mouth or cheek is stroked (rooting).  Increased sucking sounds, smacking lips, cooing, sighing, or squeaking.  Hand-to-mouth movements.  Increased sucking of fingers or hands. Late Signs of Hunger  Fussing.  Intermittent crying. Extreme Signs of Hunger Signs of extreme hunger will require calming and consoling before your baby will be able to breastfeed successfully. Do not wait for the following signs of extreme hunger to occur before you initiate breastfeeding:  Restlessness.  A loud, strong cry.  Screaming. BREASTFEEDING BASICS Breastfeeding Initiation  Find a comfortable place to sit or lie down, with your neck and back well supported.  Place a pillow or rolled up blanket under your baby to bring him or her to the level of your breast (if you are seated). Nursing pillows are specially designed to help support your arms and your baby while you breastfeed.  Make sure that your baby's  abdomen is facing your abdomen.  Gently massage your breast. With your fingertips, massage from your chest wall toward your nipple in a circular motion. This encourages milk flow. You may need to continue this action during the feeding if your milk flows slowly.  Support your breast with 4 fingers underneath and your thumb above your nipple. Make sure your fingers are well away from your nipple and your baby's mouth.  Stroke your baby's lips gently with your finger or nipple.  When your baby's mouth is open wide enough, quickly bring your baby to your breast, placing your entire nipple and as much of the colored area around your nipple (areola) as possible into your baby's mouth.  More areola should be visible above your baby's upper lip than   below the lower lip.  Your baby's tongue should be between his or her lower gum and your breast.  Ensure that your baby's mouth is correctly positioned around your nipple (latched). Your baby's lips should create a seal on your breast and be turned out (everted).  It is common for your baby to suck about 2-3 minutes in order to start the flow of breast milk. Latching Teaching your baby how to latch on to your breast properly is very important. An improper latch can cause nipple pain and decreased milk supply for you and poor weight gain in your baby. Also, if your baby is not latched onto your nipple properly, he or she may swallow some air during feeding. This can make your baby fussy. Burping your baby when you switch breasts during the feeding can help to get rid of the air. However, teaching your baby to latch on properly is still the best way to prevent fussiness from swallowing air while breastfeeding. Signs that your baby has successfully latched on to your nipple:  Silent tugging or silent sucking, without causing you pain.  Swallowing heard between every 3-4 sucks.  Muscle movement above and in front of his or her ears while sucking. Signs  that your baby has not successfully latched on to nipple:  Sucking sounds or smacking sounds from your baby while breastfeeding.  Nipple pain. If you think your baby has not latched on correctly, slip your finger into the corner of your baby's mouth to break the suction and place it between your baby's gums. Attempt breastfeeding initiation again. Signs of Successful Breastfeeding Signs from your baby:  A gradual decrease in the number of sucks or complete cessation of sucking.  Falling asleep.  Relaxation of his or her body.  Retention of a small amount of milk in his or her mouth.  Letting go of your breast by himself or herself. Signs from you:  Breasts that have increased in firmness, weight, and size 1-3 hours after feeding.  Breasts that are softer immediately after breastfeeding.  Increased milk volume, as well as a change in milk consistency and color by the fifth day of breastfeeding.  Nipples that are not sore, cracked, or bleeding. Signs That Your Baby is Getting Enough Milk  Wetting at least 3 diapers in a 24-hour period. The urine should be clear and pale yellow by age 5 days.  At least 3 stools in a 24-hour period by age 5 days. The stool should be soft and yellow.  At least 3 stools in a 24-hour period by age 7 days. The stool should be seedy and yellow.  No loss of weight greater than 10% of birth weight during the first 3 days of age.  Average weight gain of 4-7 ounces (113-198 g) per week after age 4 days.  Consistent daily weight gain by age 5 days, without weight loss after the age of 2 weeks. After a feeding, your baby may spit up a small amount. This is common. BREASTFEEDING FREQUENCY AND DURATION Frequent feeding will help you make more milk and can prevent sore nipples and breast engorgement. Breastfeed when you feel the need to reduce the fullness of your breasts or when your baby shows signs of hunger. This is called "breastfeeding on demand." Avoid  introducing a pacifier to your baby while you are working to establish breastfeeding (the first 4-6 weeks after your baby is born). After this time you may choose to use a pacifier. Research has shown that   pacifier use during the first year of a baby's life decreases the risk of sudden infant death syndrome (SIDS). Allow your baby to feed on each breast as long as he or she wants. Breastfeed until your baby is finished feeding. When your baby unlatches or falls asleep while feeding from the first breast, offer the second breast. Because newborns are often sleepy in the first few weeks of life, you may need to awaken your baby to get him or her to feed. Breastfeeding times will vary from baby to baby. However, the following rules can serve as a guide to help you ensure that your baby is properly fed:  Newborns (babies 4 weeks of age or younger) may breastfeed every 1-3 hours.  Newborns should not go longer than 3 hours during the day or 5 hours during the night without breastfeeding.  You should breastfeed your baby a minimum of 8 times in a 24-hour period until you begin to introduce solid foods to your baby at around 6 months of age. BREAST MILK PUMPING Pumping and storing breast milk allows you to ensure that your baby is exclusively fed your breast milk, even at times when you are unable to breastfeed. This is especially important if you are going back to work while you are still breastfeeding or when you are not able to be present during feedings. Your lactation consultant can give you guidelines on how long it is safe to store breast milk. A breast pump is a machine that allows you to pump milk from your breast into a sterile bottle. The pumped breast milk can then be stored in a refrigerator or freezer. Some breast pumps are operated by hand, while others use electricity. Ask your lactation consultant which type will work best for you. Breast pumps can be purchased, but some hospitals and  breastfeeding support groups lease breast pumps on a monthly basis. A lactation consultant can teach you how to hand express breast milk, if you prefer not to use a pump. CARING FOR YOUR BREASTS WHILE YOU BREASTFEED Nipples can become dry, cracked, and sore while breastfeeding. The following recommendations can help keep your breasts moisturized and healthy:  Avoid using soap on your nipples.  Wear a supportive bra. Although not required, special nursing bras and tank tops are designed to allow access to your breasts for breastfeeding without taking off your entire bra or top. Avoid wearing underwire-style bras or extremely tight bras.  Air dry your nipples for 3-4minutes after each feeding.  Use only cotton bra pads to absorb leaked breast milk. Leaking of breast milk between feedings is normal.  Use lanolin on your nipples after breastfeeding. Lanolin helps to maintain your skin's normal moisture barrier. If you use pure lanolin, you do not need to wash it off before feeding your baby again. Pure lanolin is not toxic to your baby. You may also hand express a few drops of breast milk and gently massage that milk into your nipples and allow the milk to air dry. In the first few weeks after giving birth, some women experience extremely full breasts (engorgement). Engorgement can make your breasts feel heavy, warm, and tender to the touch. Engorgement peaks within 3-5 days after you give birth. The following recommendations can help ease engorgement:  Completely empty your breasts while breastfeeding or pumping. You may want to start by applying warm, moist heat (in the shower or with warm water-soaked hand towels) just before feeding or pumping. This increases circulation and helps the milk   flow. If your baby does not completely empty your breasts while breastfeeding, pump any extra milk after he or she is finished.  Wear a snug bra (nursing or regular) or tank top for 1-2 days to signal your body  to slightly decrease milk production.  Apply ice packs to your breasts, unless this is too uncomfortable for you.  Make sure that your baby is latched on and positioned properly while breastfeeding. If engorgement persists after 48 hours of following these recommendations, contact your health care provider or a lactation consultant. OVERALL HEALTH CARE RECOMMENDATIONS WHILE BREASTFEEDING  Eat healthy foods. Alternate between meals and snacks, eating 3 of each per day. Because what you eat affects your breast milk, some of the foods may make your baby more irritable than usual. Avoid eating these foods if you are sure that they are negatively affecting your baby.  Drink milk, fruit juice, and water to satisfy your thirst (about 10 glasses a day).  Rest often, relax, and continue to take your prenatal vitamins to prevent fatigue, stress, and anemia.  Continue breast self-awareness checks.  Avoid chewing and smoking tobacco. Chemicals from cigarettes that pass into breast milk and exposure to secondhand smoke may harm your baby.  Avoid alcohol and drug use, including marijuana. Some medicines that may be harmful to your baby can pass through breast milk. It is important to ask your health care provider before taking any medicine, including all over-the-counter and prescription medicine as well as vitamin and herbal supplements. It is possible to become pregnant while breastfeeding. If birth control is desired, ask your health care provider about options that will be safe for your baby. SEEK MEDICAL CARE IF:  You feel like you want to stop breastfeeding or have become frustrated with breastfeeding.  You have painful breasts or nipples.  Your nipples are cracked or bleeding.  Your breasts are red, tender, or warm.  You have a swollen area on either breast.  You have a fever or chills.  You have nausea or vomiting.  You have drainage other than breast milk from your nipples.  Your  breasts do not become full before feedings by the fifth day after you give birth.  You feel sad and depressed.  Your baby is too sleepy to eat well.  Your baby is having trouble sleeping.   Your baby is wetting less than 3 diapers in a 24-hour period.  Your baby has less than 3 stools in a 24-hour period.  Your baby's skin or the white part of his or her eyes becomes yellow.   Your baby is not gaining weight by 5 days of age. SEEK IMMEDIATE MEDICAL CARE IF:  Your baby is overly tired (lethargic) and does not want to wake up and feed.  Your baby develops an unexplained fever.   This information is not intended to replace advice given to you by your health care provider. Make sure you discuss any questions you have with your health care provider.   Document Released: 02/03/2005 Document Revised: 10/25/2014 Document Reviewed: 07/28/2012 Elsevier Interactive Patient Education 2016 [employer]  

## 2015-05-18 NOTE — Progress Notes (Signed)
Subjective:  Karen Alexander is a 30 y.o. [phone] at 4212w1d being seen today for ongoing prenatal care.  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  Patient reports no complaints.  Contractions: Not present. Vag. Bleeding: None.  Movement: Present. Denies leaking of fluid.   The following portions of the patient's history were reviewed and updated as appropriate: allergies, current medications, past family history, past medical history, past social history, past surgical history and problem list. Problem list updated.  Objective:   Filed Vitals:   05/18/15 0944  BP: 119/78  Pulse: 100  Weight: 161 lb (73.029 kg)    Fetal Status: Fetal Heart Rate (bpm): 158   Movement: Present     General:  Alert, oriented and cooperative. Patient is in no acute distress.  Skin: Skin is warm and dry. No rash noted.   Cardiovascular: Normal heart rate noted  Respiratory: Normal respiratory effort, no problems with respiration noted  Abdomen: Soft, gravid, appropriate for gestational age. Pain/Pressure: Absent     Pelvic: Vag. Bleeding: None     Cervical exam deferred        Extremities: Normal range of motion.  Edema: None  Mental Status: Normal mood and affect. Normal behavior. Normal judgment and thought content.   Urinalysis: Urine Protein: Trace Urine Glucose: 2+  Assessment and Plan:  Pregnancy: [id number] at 5912w1d  1. Supervision of other high risk pregnancies, unspecified trimester Continue prenatal care.  2. Preexisting diabetes complicating pregnancy, antepartum, second trimester Last HgbA1C is 5.6 Normal fetal ECHO On Baby ASA F/u u/s for growth scheduled.  General obstetric precautions including but not  limited to vaginal bleeding, contractions, leaking of fluid and fetal movement were reviewed in detail with the patient. Please refer to After Visit Summary for other counseling recommendations.  Return in 3 weeks (on 06/08/2015).   Reva Boresanya S Yostin Malacara, MD

## 2015-05-29 DIAGNOSIS — Z319 Encounter for procreative management, unspecified: Secondary | ICD-10-CM | POA: Diagnosis not present

## 2015-05-31 DIAGNOSIS — E109 Type 1 diabetes mellitus without complications: Secondary | ICD-10-CM | POA: Diagnosis not present

## 2015-06-08 ENCOUNTER — Ambulatory Visit (HOSPITAL_COMMUNITY)
Admission: RE | Admit: 2015-06-08 | Discharge: 2015-06-08 | Disposition: A | Discharge status: Home / Self Care | Payer: 59 | Source: Ambulatory Visit | Attending: Maternal and Fetal Medicine | Admitting: Maternal and Fetal Medicine

## 2015-06-08 ENCOUNTER — Other Ambulatory Visit (HOSPITAL_COMMUNITY): Payer: Self-pay | Admitting: Maternal and Fetal Medicine

## 2015-06-08 ENCOUNTER — Encounter (HOSPITAL_COMMUNITY): Payer: Self-pay

## 2015-06-08 VITALS — BP 117/66 | HR 82 | Wt 161.6 lb

## 2015-06-08 DIAGNOSIS — O09892 Supervision of other high risk pregnancies, second trimester: Secondary | ICD-10-CM

## 2015-06-08 DIAGNOSIS — O09812 Supervision of pregnancy resulting from assisted reproductive technology, second trimester: Secondary | ICD-10-CM | POA: Diagnosis not present

## 2015-06-08 DIAGNOSIS — O360120 Maternal care for anti-D [Rh] antibodies, second trimester, not applicable or unspecified: Secondary | ICD-10-CM

## 2015-06-08 DIAGNOSIS — O24912 Unspecified diabetes mellitus in pregnancy, second trimester: Secondary | ICD-10-CM | POA: Insufficient documentation

## 2015-06-08 DIAGNOSIS — O24012 Pre-existing diabetes mellitus, type 1, in pregnancy, second trimester: Secondary | ICD-10-CM | POA: Diagnosis not present

## 2015-06-08 DIAGNOSIS — Z3A27 27 weeks gestation of pregnancy: Secondary | ICD-10-CM | POA: Diagnosis not present

## 2015-06-08 DIAGNOSIS — O09212 Supervision of pregnancy with history of pre-term labor, second trimester: Secondary | ICD-10-CM

## 2015-06-08 DIAGNOSIS — O24919 Unspecified diabetes mellitus in pregnancy, unspecified trimester: Secondary | ICD-10-CM | POA: Diagnosis not present

## 2015-06-08 DIAGNOSIS — E109 Type 1 diabetes mellitus without complications: Secondary | ICD-10-CM | POA: Diagnosis not present

## 2015-06-08 DIAGNOSIS — O24319 Unspecified pre-existing diabetes mellitus in pregnancy, unspecified trimester: Secondary | ICD-10-CM

## 2015-06-08 DIAGNOSIS — O24312 Unspecified pre-existing diabetes mellitus in pregnancy, second trimester: Secondary | ICD-10-CM

## 2015-06-08 DIAGNOSIS — Z36 Encounter for antenatal screening of mother: Secondary | ICD-10-CM | POA: Insufficient documentation

## 2015-06-08 DIAGNOSIS — Z4681 Encounter for fitting and adjustment of insulin pump: Secondary | ICD-10-CM | POA: Diagnosis not present

## 2015-06-12 ENCOUNTER — Ambulatory Visit (INDEPENDENT_AMBULATORY_CARE_PROVIDER_SITE_OTHER): Payer: 59 | Admitting: Obstetrics & Gynecology

## 2015-06-12 VITALS — BP 113/71 | HR 101 | Wt 162.0 lb

## 2015-06-12 DIAGNOSIS — O24312 Unspecified pre-existing diabetes mellitus in pregnancy, second trimester: Secondary | ICD-10-CM

## 2015-06-12 DIAGNOSIS — O24912 Unspecified diabetes mellitus in pregnancy, second trimester: Secondary | ICD-10-CM

## 2015-06-12 DIAGNOSIS — O0992 Supervision of high risk pregnancy, unspecified, second trimester: Secondary | ICD-10-CM

## 2015-06-12 NOTE — Patient Instructions (Signed)
Return to clinic for any scheduled appointments or obstetric concerns, or go to [facility] for evaluation  

## 2015-06-12 NOTE — Progress Notes (Signed)
Subjective:  Karen Alexander is a 30 y.o. [phone] at 5853w0d being seen today for ongoing prenatal care.  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  Patient reports no complaints.  Contractions: Not present. Vag. Bleeding: None.  Movement: Present. Denies leaking of fluid.   The following portions of the patient's history were reviewed and updated as appropriate: allergies, current medications, past family history, past medical history, past social history, past surgical history and problem list. Problem list updated.  Objective:   Filed Vitals:   06/12/15 0929  BP: 113/71  Pulse: 101  Weight: 162 lb (73.483 kg)    Fetal Status: Fetal Heart Rate (bpm): 158 Fundal Height: 28 cm Movement: Present     General:  Alert, oriented and cooperative. Patient is in no acute distress.  Skin: Skin is warm and dry. No rash noted.   Cardiovascular: Normal heart rate noted  Respiratory: Normal respiratory effort, no problems with respiration noted  Abdomen: Soft, gravid, appropriate for gestational age. Pain/Pressure: Absent     Pelvic: Vag. Bleeding: None Vag D/C Character: Thin  Cervical exam deferred        Extremities: Normal range of motion.  Edema: None  Mental Status: Normal mood and affect. Normal behavior. Normal judgment and thought content.   Urinalysis: Urine Protein: Trace Urine Glucose: 2+  Assessment and Plan:  Pregnancy: [id number] at 5653w0d  1. Preexisting diabetes complicating pregnancy, antepartum, second trimester Class B-Type 1-on insulin pump-managed by Endocrinology - Dr. Tedd [facility]Solum with [facility] [place].  While pregnant Dr. Tedd SiasSolum prefers to keep A1C around 6% and she is seen monthly for  follow up. Last A1C was 6.0% in 05/31/15. 28 week scan already scheduled by MFM Will start antenatal testing at 32 weeks  2. Supervision of high-risk pregnancy, second trimester Preterm labor symptoms and general obstetric precautions including but not limited to vaginal bleeding, contractions, leaking of fluid and fetal movement were reviewed in detail with the patient. Please refer to After Visit Summary for other counseling recommendations.  Return in about 2 weeks (around 06/26/2015) for OB Visit, TDap, Rhogam, 3rd trimester labs.   Tereso NewcomerUgonna A Tyshia Fenter, MD

## 2015-06-26 ENCOUNTER — Encounter: Payer: Self-pay | Admitting: Family Medicine

## 2015-06-26 ENCOUNTER — Ambulatory Visit (INDEPENDENT_AMBULATORY_CARE_PROVIDER_SITE_OTHER): Payer: 59 | Admitting: Family Medicine

## 2015-06-26 VITALS — BP 116/76 | HR 98 | Wt 165.0 lb

## 2015-06-26 DIAGNOSIS — O09899 Supervision of other high risk pregnancies, unspecified trimester: Secondary | ICD-10-CM

## 2015-06-26 DIAGNOSIS — O09293 Supervision of pregnancy with other poor reproductive or obstetric history, third trimester: Secondary | ICD-10-CM | POA: Insufficient documentation

## 2015-06-26 DIAGNOSIS — O24912 Unspecified diabetes mellitus in pregnancy, second trimester: Secondary | ICD-10-CM

## 2015-06-26 DIAGNOSIS — Z23 Encounter for immunization: Secondary | ICD-10-CM

## 2015-06-26 DIAGNOSIS — O360130 Maternal care for anti-D [Rh] antibodies, third trimester, not applicable or unspecified: Secondary | ICD-10-CM

## 2015-06-26 DIAGNOSIS — O24312 Unspecified pre-existing diabetes mellitus in pregnancy, second trimester: Secondary | ICD-10-CM

## 2015-06-26 MED ORDER — RHO D IMMUNE GLOBULIN 1500 UNIT/2ML IJ SOSY
300.0000 ug | PREFILLED_SYRINGE | Freq: Once | INTRAMUSCULAR | Status: AC
  Administered 2015-06-26: 300 ug via INTRAMUSCULAR

## 2015-06-26 NOTE — Assessment & Plan Note (Signed)
9 lbs 1 oz at 35 wks

## 2015-06-26 NOTE — Patient Instructions (Signed)
Third Trimester of Pregnancy The third trimester is from week 29 through week 42, months 7 through 9. The third trimester is a time when the fetus is growing rapidly. At the end of the ninth month, the fetus is about 20 inches in length and weighs 6-10 pounds.  BODY CHANGES Your body goes through many changes during pregnancy. The changes vary from woman to woman.   Your weight will continue to increase. You can expect to gain 25-35 pounds (11-16 kg) by the end of the pregnancy.  You may begin to get stretch marks on your hips, abdomen, and breasts.  You may urinate more often because the fetus is moving lower into your pelvis and pressing on your bladder.  You may develop or continue to have heartburn as a result of your pregnancy.  You may develop constipation because certain hormones are causing the muscles that push waste through your intestines to slow down.  You may develop hemorrhoids or swollen, bulging veins (varicose veins).  You may have pelvic pain because of the weight gain and pregnancy hormones relaxing your joints between the bones in your pelvis. Backaches may result from overexertion of the muscles supporting your posture.  You may have changes in your hair. These can include thickening of your hair, rapid growth, and changes in texture. Some women also have hair loss during or after pregnancy, or hair that feels dry or thin. Your hair will most likely return to normal after your baby is born.  Your breasts will continue to grow and be tender. A yellow discharge may leak from your breasts called colostrum.  Your belly button may stick out.  You may feel short of breath because of your expanding uterus.  You may notice the fetus "dropping," or moving lower in your abdomen.  You may have a bloody mucus discharge. This usually occurs a few days to a week before labor begins.  Your cervix becomes thin and soft (effaced) near your due date. WHAT TO EXPECT AT YOUR  PRENATAL EXAMS  You will have prenatal exams every 2 weeks until week 36. Then, you will have weekly prenatal exams. During a routine prenatal visit:  You will be weighed to make sure you and the fetus are growing normally.  Your blood pressure is taken.  Your abdomen will be measured to track your baby's growth.  The fetal heartbeat will be listened to.  Any test results from the previous visit will be discussed.  You may have a cervical check near your due date to see if you have effaced. At around 36 weeks, your caregiver will check your cervix. At the same time, your caregiver will also perform a test on the secretions of the vaginal tissue. This test is to determine if a type of bacteria, Group B streptococcus, is present. Your caregiver will explain this further. Your caregiver may ask you:  What your birth plan is.  How you are feeling.  If you are feeling the baby move.  If you have had any abnormal symptoms, such as leaking fluid, bleeding, severe headaches, or abdominal cramping.  If you are using any tobacco products, including cigarettes, chewing tobacco, and electronic cigarettes.  If you have any questions. Other tests or screenings that may be performed during your third trimester include:  Blood tests that check for low iron levels (anemia).  Fetal testing to check the health, activity level, and growth of the fetus. Testing is done if you have certain medical conditions or if   there are problems during the pregnancy.  HIV (human immunodeficiency virus) testing. If you are at high risk, you may be screened for HIV during your third trimester of pregnancy. FALSE LABOR You may feel small, irregular contractions that eventually go away. These are called Braxton Hicks contractions, or false labor. Contractions may last for hours, days, or even weeks before true labor sets in. If contractions come at regular intervals, intensify, or become painful, it is best to be seen  by your caregiver.  SIGNS OF LABOR   Menstrual-like cramps.  Contractions that are 5 minutes apart or less.  Contractions that start on the top of the uterus and spread down to the lower abdomen and back.  A sense of increased pelvic pressure or back pain.  A watery or bloody mucus discharge that comes from the vagina. If you have any of these signs before the 37th week of pregnancy, call your caregiver right away. You need to go to the hospital to get checked immediately. HOME CARE INSTRUCTIONS   Avoid all smoking, herbs, alcohol, and unprescribed drugs. These chemicals affect the formation and growth of the baby.  Do not use any tobacco products, including cigarettes, chewing tobacco, and electronic cigarettes. If you need help quitting, ask your health care provider. You may receive counseling support and other resources to help you quit.  Follow your caregiver's instructions regarding medicine use. There are medicines that are either safe or unsafe to take during pregnancy.  Exercise only as directed by your caregiver. Experiencing uterine cramps is a good sign to stop exercising.  Continue to eat regular, healthy meals.  Wear a good support bra for breast tenderness.  Do not use hot tubs, steam rooms, or saunas.  Wear your seat belt at all times when driving.  Avoid raw meat, uncooked cheese, cat litter boxes, and soil used by cats. These carry germs that can cause birth defects in the baby.  Take your prenatal vitamins.  Take 1500-2000 mg of calcium daily starting at the 20th week of pregnancy until you deliver your baby.  Try taking a stool softener (if your caregiver approves) if you develop constipation. Eat more high-fiber foods, such as fresh vegetables or fruit and whole grains. Drink plenty of fluids to keep your urine clear or pale yellow.  Take warm sitz baths to soothe any pain or discomfort caused by hemorrhoids. Use hemorrhoid cream if your caregiver  approves.  If you develop varicose veins, wear support hose. Elevate your feet for 15 minutes, 3-4 times a day. Limit salt in your diet.  Avoid heavy lifting, wear low heal shoes, and practice good posture.  Rest a lot with your legs elevated if you have leg cramps or low back pain.  Visit your dentist if you have not gone during your pregnancy. Use a soft toothbrush to brush your teeth and be gentle when you floss.  A sexual relationship may be continued unless your caregiver directs you otherwise.  Do not travel far distances unless it is absolutely necessary and only with the approval of your caregiver.  Take prenatal classes to understand, practice, and ask questions about the labor and delivery.  Make a trial run to the hospital.  Pack your hospital bag.  Prepare the baby's nursery.  Continue to go to all your prenatal visits as directed by your caregiver. SEEK MEDICAL CARE IF:  You are unsure if you are in labor or if your water has broken.  You have dizziness.  You have   mild pelvic cramps, pelvic pressure, or nagging pain in your abdominal area.  You have persistent nausea, vomiting, or diarrhea.  You have a bad smelling vaginal discharge.  You have pain with urination. SEEK IMMEDIATE MEDICAL CARE IF:   You have a fever.  You are leaking fluid from your vagina.  You have spotting or bleeding from your vagina.  You have severe abdominal cramping or pain.  You have rapid weight loss or gain.  You have shortness of breath with chest pain.  You notice sudden or extreme swelling of your face, hands, ankles, feet, or legs.  You have not felt your baby move in over an hour.  You have severe headaches that do not go away with medicine.  You have vision changes.   This information is not intended to replace advice given to you by your health care provider. Make sure you discuss any questions you have with your health care provider.   Document Released:  01/28/2001 Document Revised: 02/24/2014 Document Reviewed: 04/06/2012 Elsevier Interactive Patient Education 2016 [employer]  Breastfeeding Deciding to breastfeed is one of the best choices you can make for you and your baby. A change in hormones during pregnancy causes your breast tissue to grow and increases the number and size of your milk ducts. These hormones also allow proteins, sugars, and fats from your blood supply to make breast milk in your milk-producing glands. Hormones prevent breast milk from being released before your baby is born as well as prompt milk flow after birth. Once breastfeeding has begun, thoughts of your baby, as well as his or her sucking or crying, can stimulate the release of milk from your milk-producing glands.  BENEFITS OF BREASTFEEDING For Your Baby  Your first milk (colostrum) helps your baby's digestive system function better.  There are antibodies in your milk that help your baby fight off infections.  Your baby has a lower incidence of asthma, allergies, and sudden infant death syndrome.  The nutrients in breast milk are better for your baby than infant formulas and are designed uniquely for your baby's needs.  Breast milk improves your baby's brain development.  Your baby is less likely to develop other conditions, such as childhood obesity, asthma, or type 2 diabetes mellitus. For You  Breastfeeding helps to create a very special bond between you and your baby.  Breastfeeding is convenient. Breast milk is always available at the correct temperature and costs nothing.  Breastfeeding helps to burn calories and helps you lose the weight gained during pregnancy.  Breastfeeding makes your uterus contract to its prepregnancy size faster and slows bleeding (lochia) after you give birth.   Breastfeeding helps to lower your risk of developing type 2 diabetes mellitus, osteoporosis, and breast or ovarian cancer later in life. SIGNS THAT YOUR BABY IS  HUNGRY Early Signs of Hunger  Increased alertness or activity.  Stretching.  Movement of the head from side to side.  Movement of the head and opening of the mouth when the corner of the mouth or cheek is stroked (rooting).  Increased sucking sounds, smacking lips, cooing, sighing, or squeaking.  Hand-to-mouth movements.  Increased sucking of fingers or hands. Late Signs of Hunger  Fussing.  Intermittent crying. Extreme Signs of Hunger Signs of extreme hunger will require calming and consoling before your baby will be able to breastfeed successfully. Do not wait for the following signs of extreme hunger to occur before you initiate breastfeeding:  Restlessness.  A loud, strong cry.  Screaming.   BREASTFEEDING BASICS Breastfeeding Initiation  Find a comfortable place to sit or lie down, with your neck and back well supported.  Place a pillow or rolled up blanket under your baby to bring him or her to the level of your breast (if you are seated). Nursing pillows are specially designed to help support your arms and your baby while you breastfeed.  Make sure that your baby's abdomen is facing your abdomen.  Gently massage your breast. With your fingertips, massage from your chest wall toward your nipple in a circular motion. This encourages milk flow. You may need to continue this action during the feeding if your milk flows slowly.  Support your breast with 4 fingers underneath and your thumb above your nipple. Make sure your fingers are well away from your nipple and your baby's mouth.  Stroke your baby's lips gently with your finger or nipple.  When your baby's mouth is open wide enough, quickly bring your baby to your breast, placing your entire nipple and as much of the colored area around your nipple (areola) as possible into your baby's mouth.  More areola should be visible above your baby's upper lip than below the lower lip.  Your baby's tongue should be between his  or her lower gum and your breast.  Ensure that your baby's mouth is correctly positioned around your nipple (latched). Your baby's lips should create a seal on your breast and be turned out (everted).  It is common for your baby to suck about 2-3 minutes in order to start the flow of breast milk. Latching Teaching your baby how to latch on to your breast properly is very important. An improper latch can cause nipple pain and decreased milk supply for you and poor weight gain in your baby. Also, if your baby is not latched onto your nipple properly, he or she may swallow some air during feeding. This can make your baby fussy. Burping your baby when you switch breasts during the feeding can help to get rid of the air. However, teaching your baby to latch on properly is still the best way to prevent fussiness from swallowing air while breastfeeding. Signs that your baby has successfully latched on to your nipple:  Silent tugging or silent sucking, without causing you pain.  Swallowing heard between every 3-4 sucks.  Muscle movement above and in front of his or her ears while sucking. Signs that your baby has not successfully latched on to nipple:  Sucking sounds or smacking sounds from your baby while breastfeeding.  Nipple pain. If you think your baby has not latched on correctly, slip your finger into the corner of your baby's mouth to break the suction and place it between your baby's gums. Attempt breastfeeding initiation again. Signs of Successful Breastfeeding Signs from your baby:  A gradual decrease in the number of sucks or complete cessation of sucking.  Falling asleep.  Relaxation of his or her body.  Retention of a small amount of milk in his or her mouth.  Letting go of your breast by himself or herself. Signs from you:  Breasts that have increased in firmness, weight, and size 1-3 hours after feeding.  Breasts that are softer immediately after  breastfeeding.  Increased milk volume, as well as a change in milk consistency and color by the fifth day of breastfeeding.  Nipples that are not sore, cracked, or bleeding. Signs That Your Baby is Getting Enough Milk  Wetting at least 3 diapers in a 24-hour period.   The urine should be clear and pale yellow by age 5 days.  At least 3 stools in a 24-hour period by age 5 days. The stool should be soft and yellow.  At least 3 stools in a 24-hour period by age 7 days. The stool should be seedy and yellow.  No loss of weight greater than 10% of birth weight during the first 3 days of age.  Average weight gain of 4-7 ounces (113-198 g) per week after age 4 days.  Consistent daily weight gain by age 5 days, without weight loss after the age of 2 weeks. After a feeding, your baby may spit up a small amount. This is common. BREASTFEEDING FREQUENCY AND DURATION Frequent feeding will help you make more milk and can prevent sore nipples and breast engorgement. Breastfeed when you feel the need to reduce the fullness of your breasts or when your baby shows signs of hunger. This is called "breastfeeding on demand." Avoid introducing a pacifier to your baby while you are working to establish breastfeeding (the first 4-6 weeks after your baby is born). After this time you may choose to use a pacifier. Research has shown that pacifier use during the first year of a baby's life decreases the risk of sudden infant death syndrome (SIDS). Allow your baby to feed on each breast as long as he or she wants. Breastfeed until your baby is finished feeding. When your baby unlatches or falls asleep while feeding from the first breast, offer the second breast. Because newborns are often sleepy in the first few weeks of life, you may need to awaken your baby to get him or her to feed. Breastfeeding times will vary from baby to baby. However, the following rules can serve as a guide to help you ensure that your baby is  properly fed:  Newborns (babies 4 weeks of age or younger) may breastfeed every 1-3 hours.  Newborns should not go longer than 3 hours during the day or 5 hours during the night without breastfeeding.  You should breastfeed your baby a minimum of 8 times in a 24-hour period until you begin to introduce solid foods to your baby at around 6 months of age. BREAST MILK PUMPING Pumping and storing breast milk allows you to ensure that your baby is exclusively fed your breast milk, even at times when you are unable to breastfeed. This is especially important if you are going back to work while you are still breastfeeding or when you are not able to be present during feedings. Your lactation consultant can give you guidelines on how long it is safe to store breast milk. A breast pump is a machine that allows you to pump milk from your breast into a sterile bottle. The pumped breast milk can then be stored in a refrigerator or freezer. Some breast pumps are operated by hand, while others use electricity. Ask your lactation consultant which type will work best for you. Breast pumps can be purchased, but some hospitals and breastfeeding support groups lease breast pumps on a monthly basis. A lactation consultant can teach you how to hand express breast milk, if you prefer not to use a pump. CARING FOR YOUR BREASTS WHILE YOU BREASTFEED Nipples can become dry, cracked, and sore while breastfeeding. The following recommendations can help keep your breasts moisturized and healthy:  Avoid using soap on your nipples.  Wear a supportive bra. Although not required, special nursing bras and tank tops are designed to allow access to your   breasts for breastfeeding without taking off your entire bra or top. Avoid wearing underwire-style bras or extremely tight bras.  Air dry your nipples for 3-4minutes after each feeding.  Use only cotton bra pads to absorb leaked breast milk. Leaking of breast milk between feedings  is normal.  Use lanolin on your nipples after breastfeeding. Lanolin helps to maintain your skin's normal moisture barrier. If you use pure lanolin, you do not need to wash it off before feeding your baby again. Pure lanolin is not toxic to your baby. You may also hand express a few drops of breast milk and gently massage that milk into your nipples and allow the milk to air dry. In the first few weeks after giving birth, some women experience extremely full breasts (engorgement). Engorgement can make your breasts feel heavy, warm, and tender to the touch. Engorgement peaks within 3-5 days after you give birth. The following recommendations can help ease engorgement:  Completely empty your breasts while breastfeeding or pumping. You may want to start by applying warm, moist heat (in the shower or with warm water-soaked hand towels) just before feeding or pumping. This increases circulation and helps the milk flow. If your baby does not completely empty your breasts while breastfeeding, pump any extra milk after he or she is finished.  Wear a snug bra (nursing or regular) or tank top for 1-2 days to signal your body to slightly decrease milk production.  Apply ice packs to your breasts, unless this is too uncomfortable for you.  Make sure that your baby is latched on and positioned properly while breastfeeding. If engorgement persists after 48 hours of following these recommendations, contact your health care provider or a lactation consultant. OVERALL HEALTH CARE RECOMMENDATIONS WHILE BREASTFEEDING  Eat healthy foods. Alternate between meals and snacks, eating 3 of each per day. Because what you eat affects your breast milk, some of the foods may make your baby more irritable than usual. Avoid eating these foods if you are sure that they are negatively affecting your baby.  Drink milk, fruit juice, and water to satisfy your thirst (about 10 glasses a day).  Rest often, relax, and continue to take  your prenatal vitamins to prevent fatigue, stress, and anemia.  Continue breast self-awareness checks.  Avoid chewing and smoking tobacco. Chemicals from cigarettes that pass into breast milk and exposure to secondhand smoke may harm your baby.  Avoid alcohol and drug use, including marijuana. Some medicines that may be harmful to your baby can pass through breast milk. It is important to ask your health care provider before taking any medicine, including all over-the-counter and prescription medicine as well as vitamin and herbal supplements. It is possible to become pregnant while breastfeeding. If birth control is desired, ask your health care provider about options that will be safe for your baby. SEEK MEDICAL CARE IF:  You feel like you want to stop breastfeeding or have become frustrated with breastfeeding.  You have painful breasts or nipples.  Your nipples are cracked or bleeding.  Your breasts are red, tender, or warm.  You have a swollen area on either breast.  You have a fever or chills.  You have nausea or vomiting.  You have drainage other than breast milk from your nipples.  Your breasts do not become full before feedings by the fifth day after you give birth.  You feel sad and depressed.  Your baby is too sleepy to eat well.  Your baby is having trouble sleeping.     Your baby is wetting less than 3 diapers in a 24-hour period.  Your baby has less than 3 stools in a 24-hour period.  Your baby's skin or the white part of his or her eyes becomes yellow.   Your baby is not gaining weight by 5 days of age. SEEK IMMEDIATE MEDICAL CARE IF:  Your baby is overly tired (lethargic) and does not want to wake up and feed.  Your baby develops an unexplained fever.   This information is not intended to replace advice given to you by your health care provider. Make sure you discuss any questions you have with your health care provider.   Document Released: 02/03/2005  Document Revised: 10/25/2014 Document Reviewed: 07/28/2012 Elsevier Interactive Patient Education 2016 [employer]  

## 2015-06-26 NOTE — Progress Notes (Signed)
Subjective:  Nicholes MangoMichele J Cheatwood is a 30 y.o. [phone] at 9171w0d being seen today for ongoing prenatal care.  She is currently monitored for the following issues for this high-risk pregnancy and has Type 1 diabetes mellitus (HCC); Infertility female; Supervision of other high risk pregnancies, unspecified trimester; Preexisting diabetes complicating pregnancy, antepartum; Rh negative, antepartum; Allergic rhinitis; and History of shoulder dystocia in prior pregnancy, currently pregnant in third trimester on her problem list.  Patient reports no complaints.  Contractions: Not present. Vag. Bleeding: None.  Movement: Present. Denies leaking of fluid.   The following portions of the patient's history were reviewed and updated as appropriate: allergies, current medications, past family history, past medical history, past social history, past surgical history and problem list. Problem list updated.  Objective:   Filed Vitals:   06/26/15 1008  BP: 116/76  Pulse: 98  Weight: 165 lb (74.844 kg)    Fetal Status: Fetal Heart Rate (bpm): 155 Fundal Height: 29 cm Movement: Present     General:  Alert, oriented and cooperative. Patient is in no acute distress.  Skin: Skin is warm and dry. No rash noted.   Cardiovascular: Normal heart rate noted  Respiratory: Normal respiratory effort, no problems with respiration noted  Abdomen: Soft, gravid, appropriate for gestational age. Pain/Pressure: Absent     Pelvic: Vag. Bleeding: None Vag D/C Character: Thin   Cervical exam deferred        Extremities: Normal range of motion.  Edema: None  Mental Status: Normal mood and affect. Normal behavior. Normal judgment and thought content.   Last Hgb A1C is 6.0 Assessment and Plan:  Pregnancy: [id number] at 5171w0d  1. Supervision of other high risk pregnancies, unspecified trimester Continue routine prenatal care.  - CBC - RPR - HIV antibody - Tdap vaccine greater than or equal to 7yo IM - rho (d) immune globulin  (RHIG/RHOPHYLAC) injection 300 mcg; Inject 2 mLs (300 mcg total) into the muscle once.  2. Preexisting diabetes complicating pregnancy, antepartum, second trimester Per her endocrinology  3. Rh negative, antepartum, third trimester, not applicable or unspecified fetus - Antibody screen - rho (d) immune globulin (RHIG/RHOPHYLAC) injection 300 mcg; Inject 2 mLs (300 mcg total) into the muscle once.  4. History of shoulder dystocia in prior pregnancy, currently pregnant in third trimester Her last baby was > 9 pounds at 35 wks--last us was 81%  Preterm labor symptoms and general obstetric precautions including but not limited to vaginal bleeding, contractions, leaking of fluid and fetal movement were reviewed in detail with the patient. Please refer to After Visit Summary for other counseling recommendations.  Return in 2 weeks (on 07/10/2015).   Reva Boresanya S Daiden Coltrane, MD

## 2015-06-27 LAB — ANTIBODY SCREEN: Antibody Screen: NEGATIVE

## 2015-06-28 DIAGNOSIS — O24012 Pre-existing diabetes mellitus, type 1, in pregnancy, second trimester: Secondary | ICD-10-CM | POA: Diagnosis not present

## 2015-06-28 DIAGNOSIS — E109 Type 1 diabetes mellitus without complications: Secondary | ICD-10-CM | POA: Diagnosis not present

## 2015-06-28 DIAGNOSIS — Z3A18 18 weeks gestation of pregnancy: Secondary | ICD-10-CM | POA: Diagnosis not present

## 2015-07-06 ENCOUNTER — Other Ambulatory Visit (HOSPITAL_COMMUNITY): Payer: Self-pay | Admitting: Maternal and Fetal Medicine

## 2015-07-06 ENCOUNTER — Ambulatory Visit (HOSPITAL_COMMUNITY)
Admission: RE | Admit: 2015-07-06 | Discharge: 2015-07-06 | Disposition: A | Discharge status: Home / Self Care | Payer: 59 | Source: Ambulatory Visit | Attending: Obstetrics & Gynecology | Admitting: Obstetrics & Gynecology

## 2015-07-06 ENCOUNTER — Encounter (HOSPITAL_COMMUNITY): Payer: Self-pay

## 2015-07-06 VITALS — BP 103/67 | HR 101 | Wt 165.8 lb

## 2015-07-06 DIAGNOSIS — Z8751 Personal history of pre-term labor: Secondary | ICD-10-CM

## 2015-07-06 DIAGNOSIS — O24113 Pre-existing diabetes mellitus, type 2, in pregnancy, third trimester: Secondary | ICD-10-CM | POA: Insufficient documentation

## 2015-07-06 DIAGNOSIS — O09813 Supervision of pregnancy resulting from assisted reproductive technology, third trimester: Secondary | ICD-10-CM | POA: Insufficient documentation

## 2015-07-06 DIAGNOSIS — Z3A29 29 weeks gestation of pregnancy: Secondary | ICD-10-CM | POA: Diagnosis not present

## 2015-07-06 DIAGNOSIS — O24312 Unspecified pre-existing diabetes mellitus in pregnancy, second trimester: Secondary | ICD-10-CM

## 2015-07-06 DIAGNOSIS — O360130 Maternal care for anti-D [Rh] antibodies, third trimester, not applicable or unspecified: Secondary | ICD-10-CM

## 2015-07-06 DIAGNOSIS — O24013 Pre-existing diabetes mellitus, type 1, in pregnancy, third trimester: Secondary | ICD-10-CM

## 2015-07-06 DIAGNOSIS — O403XX0 Polyhydramnios, third trimester, not applicable or unspecified: Secondary | ICD-10-CM | POA: Diagnosis not present

## 2015-07-06 DIAGNOSIS — O409XX0 Polyhydramnios, unspecified trimester, not applicable or unspecified: Secondary | ICD-10-CM

## 2015-07-06 DIAGNOSIS — O09213 Supervision of pregnancy with history of pre-term labor, third trimester: Secondary | ICD-10-CM | POA: Insufficient documentation

## 2015-07-06 DIAGNOSIS — Z4681 Encounter for fitting and adjustment of insulin pump: Secondary | ICD-10-CM | POA: Diagnosis not present

## 2015-07-06 DIAGNOSIS — O3663X0 Maternal care for excessive fetal growth, third trimester, not applicable or unspecified: Secondary | ICD-10-CM | POA: Diagnosis not present

## 2015-07-09 ENCOUNTER — Other Ambulatory Visit (HOSPITAL_COMMUNITY): Payer: 59

## 2015-07-10 ENCOUNTER — Ambulatory Visit (INDEPENDENT_AMBULATORY_CARE_PROVIDER_SITE_OTHER): Payer: 59 | Admitting: Obstetrics & Gynecology

## 2015-07-10 VITALS — BP 100/68 | HR 102 | Wt 167.0 lb

## 2015-07-10 DIAGNOSIS — O24313 Unspecified pre-existing diabetes mellitus in pregnancy, third trimester: Secondary | ICD-10-CM

## 2015-07-10 DIAGNOSIS — O403XX0 Polyhydramnios, third trimester, not applicable or unspecified: Secondary | ICD-10-CM | POA: Insufficient documentation

## 2015-07-10 DIAGNOSIS — O24913 Unspecified diabetes mellitus in pregnancy, third trimester: Secondary | ICD-10-CM

## 2015-07-10 DIAGNOSIS — O3663X0 Maternal care for excessive fetal growth, third trimester, not applicable or unspecified: Secondary | ICD-10-CM

## 2015-07-10 DIAGNOSIS — O09899 Supervision of other high risk pregnancies, unspecified trimester: Secondary | ICD-10-CM

## 2015-07-10 DIAGNOSIS — O403XX1 Polyhydramnios, third trimester, fetus 1: Secondary | ICD-10-CM

## 2015-07-10 DIAGNOSIS — O09293 Supervision of pregnancy with other poor reproductive or obstetric history, third trimester: Secondary | ICD-10-CM

## 2015-07-10 NOTE — Progress Notes (Signed)
Subjective:  Nicholes MangoMichele J Palen is a 30 y.o. [phone] at 6537w0d being seen today for ongoing prenatal care.  She is currently monitored for the following issues for this high-risk pregnancy and has Type 1 diabetes mellitus (HCC); Infertility female; Supervision of other high risk pregnancies, unspecified trimester; Preexisting diabetes complicating pregnancy, antepartum; Rh negative, antepartum; Allergic rhinitis; History of shoulder dystocia in prior pregnancy, currently pregnant in third trimester; Polyhydramnios in third trimester, antepartum; and Large for dates fetus affecting pregnancy in third trimester, antepartum on her problem list.  Patient reports no complaints.  Contractions: Not present. Vag. Bleeding: None.  Movement: Present. Denies leaking of fluid.   The following portions of the patient's history were reviewed and updated as appropriate: allergies, current medications, past family history, past medical history, past social history, past surgical history and problem list. Problem list updated.  Objective:   Filed Vitals:   07/10/15 0914  BP: 100/68  Pulse: 102  Weight: 167 lb (75.751 kg)    Fetal Status: Fetal Heart Rate (bpm): 145 Fundal Height: 35 cm Movement: Present     General:  Alert, oriented and cooperative. Patient is in no acute distress.  Skin: Skin is warm and dry. No rash noted.   Cardiovascular: Normal heart rate noted  Respiratory: Normal respiratory effort, no problems with respiration noted  Abdomen: Soft, gravid, appropriate for gestational age. Pain/Pressure: Absent     Pelvic: Vag. Bleeding: None Vag D/C Character: Thin   Cervical exam deferred        Extremities: Normal range of motion.  Edema: None  Mental Status: Normal mood and affect. Normal behavior. Normal judgment and thought content.   Urinalysis: Urine Protein: Trace Urine Glucose: Negative 07/06/15 4553w3d EFW 2353g  90% (AC>97%), AFI 31.09 cm. Assessment and Plan:  Pregnancy: [id number] at  6737w0d  1. Preexisting diabetes complicating pregnancy, antepartum, third trimester Will continue to follow Dr. Tedd SiasSolum recommendations. Last HgA1C on 06/28/15 was 6.3%.  Start antenatal testing now due to polyhydramnios.  2. Polyhydramnios in third trimester, antepartum, fetus 1 07/06/15 7353w3d EFW 2353g  90% (AC>97%), AFI 31.09 cm. Weekly BPP until 32 weeks then 2x/week testing. Discussed increased risk of malpresentation.  3. Large for dates fetus affecting pregnancy in third trimester, antepartum 4. History of shoulder dystocia in prior pregnancy, currently pregnant in third trimester Continue to monitor EFW. Will discuss mode of delivery closer towards term. Patient aware of concern about recurrent SD.   5. Supervision of other high risk pregnancies, unspecified trimester Normal 3rd trimester labs (drawn on 06/28/15 at Dr. Pricilla HandlerSolum's office) Preterm labor symptoms and general obstetric precautions including but not limited to vaginal bleeding, contractions, leaking of fluid and fetal movement were reviewed in detail with the patient. Please refer to After Visit Summary for other counseling recommendations.  Return in about 3 weeks (around 07/31/2015) for OB Visit, NST.  08/03/15 NST and AFI.   Tereso NewcomerUgonna A Jaxx Huish, MD

## 2015-07-10 NOTE — Patient Instructions (Signed)
Return to clinic for any scheduled appointments or obstetric concerns, or go to [facility] for evaluation  

## 2015-07-13 ENCOUNTER — Encounter (HOSPITAL_COMMUNITY): Payer: Self-pay

## 2015-07-13 ENCOUNTER — Ambulatory Visit (HOSPITAL_COMMUNITY)
Admission: RE | Admit: 2015-07-13 | Discharge: 2015-07-13 | Disposition: A | Discharge status: Home / Self Care | Payer: 59 | Source: Ambulatory Visit | Attending: Obstetrics & Gynecology | Admitting: Obstetrics & Gynecology

## 2015-07-13 VITALS — BP 120/79 | HR 105 | Wt 168.5 lb

## 2015-07-13 DIAGNOSIS — Z6791 Unspecified blood type, Rh negative: Secondary | ICD-10-CM | POA: Insufficient documentation

## 2015-07-13 DIAGNOSIS — O09213 Supervision of pregnancy with history of pre-term labor, third trimester: Secondary | ICD-10-CM | POA: Insufficient documentation

## 2015-07-13 DIAGNOSIS — Z3A30 30 weeks gestation of pregnancy: Secondary | ICD-10-CM | POA: Diagnosis not present

## 2015-07-13 DIAGNOSIS — O403XX0 Polyhydramnios, third trimester, not applicable or unspecified: Secondary | ICD-10-CM | POA: Diagnosis not present

## 2015-07-13 DIAGNOSIS — O26893 Other specified pregnancy related conditions, third trimester: Secondary | ICD-10-CM | POA: Diagnosis not present

## 2015-07-13 DIAGNOSIS — O09813 Supervision of pregnancy resulting from assisted reproductive technology, third trimester: Secondary | ICD-10-CM | POA: Insufficient documentation

## 2015-07-13 DIAGNOSIS — O3663X0 Maternal care for excessive fetal growth, third trimester, not applicable or unspecified: Secondary | ICD-10-CM | POA: Diagnosis not present

## 2015-07-13 DIAGNOSIS — O360130 Maternal care for anti-D [Rh] antibodies, third trimester, not applicable or unspecified: Secondary | ICD-10-CM

## 2015-07-13 DIAGNOSIS — O403XX1 Polyhydramnios, third trimester, fetus 1: Secondary | ICD-10-CM

## 2015-07-13 DIAGNOSIS — O09293 Supervision of pregnancy with other poor reproductive or obstetric history, third trimester: Secondary | ICD-10-CM

## 2015-07-13 DIAGNOSIS — O24113 Pre-existing diabetes mellitus, type 2, in pregnancy, third trimester: Secondary | ICD-10-CM | POA: Diagnosis not present

## 2015-07-13 DIAGNOSIS — O09899 Supervision of other high risk pregnancies, unspecified trimester: Secondary | ICD-10-CM

## 2015-07-13 DIAGNOSIS — O24013 Pre-existing diabetes mellitus, type 1, in pregnancy, third trimester: Secondary | ICD-10-CM | POA: Diagnosis not present

## 2015-07-13 DIAGNOSIS — O409XX0 Polyhydramnios, unspecified trimester, not applicable or unspecified: Secondary | ICD-10-CM

## 2015-07-20 ENCOUNTER — Encounter (HOSPITAL_COMMUNITY): Payer: Self-pay

## 2015-07-20 ENCOUNTER — Ambulatory Visit (HOSPITAL_COMMUNITY)
Admission: RE | Admit: 2015-07-20 | Discharge: 2015-07-20 | Disposition: A | Discharge status: Home / Self Care | Payer: 59 | Source: Ambulatory Visit | Attending: Obstetrics & Gynecology | Admitting: Obstetrics & Gynecology

## 2015-07-20 DIAGNOSIS — O409XX0 Polyhydramnios, unspecified trimester, not applicable or unspecified: Secondary | ICD-10-CM | POA: Diagnosis not present

## 2015-07-20 DIAGNOSIS — O403XX0 Polyhydramnios, third trimester, not applicable or unspecified: Secondary | ICD-10-CM | POA: Diagnosis not present

## 2015-07-20 DIAGNOSIS — Z3A31 31 weeks gestation of pregnancy: Secondary | ICD-10-CM | POA: Insufficient documentation

## 2015-07-20 DIAGNOSIS — O24113 Pre-existing diabetes mellitus, type 2, in pregnancy, third trimester: Secondary | ICD-10-CM | POA: Diagnosis not present

## 2015-07-30 DIAGNOSIS — O24013 Pre-existing diabetes mellitus, type 1, in pregnancy, third trimester: Secondary | ICD-10-CM | POA: Diagnosis not present

## 2015-07-31 ENCOUNTER — Ambulatory Visit (INDEPENDENT_AMBULATORY_CARE_PROVIDER_SITE_OTHER): Payer: 59 | Admitting: Obstetrics & Gynecology

## 2015-07-31 ENCOUNTER — Ambulatory Visit (HOSPITAL_COMMUNITY): Payer: 59

## 2015-07-31 VITALS — BP 119/79 | HR 77 | Wt 175.4 lb

## 2015-07-31 DIAGNOSIS — O3663X0 Maternal care for excessive fetal growth, third trimester, not applicable or unspecified: Secondary | ICD-10-CM

## 2015-07-31 DIAGNOSIS — O24913 Unspecified diabetes mellitus in pregnancy, third trimester: Secondary | ICD-10-CM

## 2015-07-31 DIAGNOSIS — O403XX1 Polyhydramnios, third trimester, fetus 1: Secondary | ICD-10-CM

## 2015-07-31 DIAGNOSIS — O09293 Supervision of pregnancy with other poor reproductive or obstetric history, third trimester: Secondary | ICD-10-CM

## 2015-07-31 DIAGNOSIS — O24313 Unspecified pre-existing diabetes mellitus in pregnancy, third trimester: Secondary | ICD-10-CM

## 2015-07-31 DIAGNOSIS — O09899 Supervision of other high risk pregnancies, unspecified trimester: Secondary | ICD-10-CM

## 2015-07-31 NOTE — Progress Notes (Signed)
Subjective:  Karen Alexander is a 30 y.o. [phone] at 7453w0d being seen today for ongoing prenatal care.  She is currently monitored for the following issues for this high-risk pregnancy and has Type 1 diabetes mellitus (HCC); Infertility female; Supervision of other high risk pregnancies, unspecified trimester; Preexisting diabetes complicating pregnancy, antepartum; Rh negative, antepartum; Allergic rhinitis; History of shoulder dystocia in prior pregnancy, currently pregnant in third trimester; Polyhydramnios in third trimester, antepartum; and Large for dates fetus affecting pregnancy in third trimester, antepartum on her problem list.  Patient reports backache.  Contractions: Not present. Vag. Bleeding: None.  Movement: Present. Denies leaking of fluid.   The following portions of the patient's history were reviewed and updated as appropriate: allergies, current medications, past family history, past medical history, past social history, past surgical history and problem list. Problem list updated.  Objective:   Filed Vitals:   07/31/15 1033  BP: 119/79  Pulse: 77  Weight: 175 lb 6.4 oz (79.561 kg)    Fetal Status: Fetal Heart Rate (bpm): 124 Fundal Height: 40 cm Movement: Present     General:  Alert, oriented and cooperative. Patient is in no acute distress.  Skin: Skin is warm and dry. No rash noted.   Cardiovascular: Normal heart rate noted  Respiratory: Normal respiratory effort, no problems with respiration noted  Abdomen: Soft, gravid, appropriate for gestational age. Pain/Pressure: Absent     Pelvic: Cervical exam deferred        Extremities: Normal range of motion.  Edema: Mild pitting, slight indentation  Mental Status: Normal mood and affect. Normal behavior. Normal judgment and thought content.   Urinalysis: Urine Protein: Trace Urine Glucose: Negative NST performed today was reviewed and was found to be reactive.    Assessment and Plan:  Pregnancy: [id number] at 4853w0d  1.  Preexisting diabetes complicating pregnancy, antepartum, third trimester Will continue to follow Dr. Tedd SiasSolum recommendations. Last HgA1C on 07/30/15 was 6.4%. Continue antenatal testing.  2. Polyhydramnios in third trimester, antepartum, fetus 1 Weekly AFI, AFI ~ 30 cm.  Discussed increased risk of malpresentation.  3. Large for dates fetus affecting pregnancy in third trimester, antepartum 4. History of shoulder dystocia in prior pregnancy, currently pregnant in third trimester Continue to monitor EFW, next growth scan is on 08/07/15.  Will re-discuss mode of delivery closer towards term. Patient aware of concern about recurrent SD.   5. Supervision of other high risk pregnancies, unspecified trimester Preterm labor symptoms and general obstetric precautions including but not limited to vaginal bleeding, contractions, leaking of fluid and fetal movement were reviewed in detail with the patient. Please refer to After Visit Summary for other counseling recommendations.  Return in about 3 days (around 08/03/2015) for NST and AFI. 08/10/15 OB visit and NST.   Tereso NewcomerUgonna A Anyanwu, MD

## 2015-07-31 NOTE — Patient Instructions (Signed)
Return to clinic for any scheduled appointments or obstetric concerns, or go to [facility] for evaluation  

## 2015-08-01 ENCOUNTER — Encounter: Payer: Self-pay | Admitting: *Deleted

## 2015-08-03 ENCOUNTER — Ambulatory Visit (INDEPENDENT_AMBULATORY_CARE_PROVIDER_SITE_OTHER): Payer: 59 | Admitting: *Deleted

## 2015-08-03 VITALS — BP 113/77 | HR 113

## 2015-08-03 DIAGNOSIS — O24414 Gestational diabetes mellitus in pregnancy, insulin controlled: Secondary | ICD-10-CM

## 2015-08-03 DIAGNOSIS — O24013 Pre-existing diabetes mellitus, type 1, in pregnancy, third trimester: Secondary | ICD-10-CM | POA: Diagnosis not present

## 2015-08-03 DIAGNOSIS — Z4681 Encounter for fitting and adjustment of insulin pump: Secondary | ICD-10-CM | POA: Diagnosis not present

## 2015-08-03 NOTE — Progress Notes (Signed)
NST reviewed and reactive.  

## 2015-08-07 ENCOUNTER — Ambulatory Visit (HOSPITAL_COMMUNITY)
Admission: RE | Admit: 2015-08-07 | Discharge: 2015-08-07 | Disposition: A | Discharge status: Home / Self Care | Payer: 59 | Source: Ambulatory Visit | Attending: Obstetrics & Gynecology | Admitting: Obstetrics & Gynecology

## 2015-08-07 ENCOUNTER — Encounter (HOSPITAL_COMMUNITY): Payer: Self-pay

## 2015-08-07 ENCOUNTER — Other Ambulatory Visit (HOSPITAL_COMMUNITY): Payer: Self-pay | Admitting: Maternal and Fetal Medicine

## 2015-08-07 DIAGNOSIS — O24013 Pre-existing diabetes mellitus, type 1, in pregnancy, third trimester: Secondary | ICD-10-CM | POA: Diagnosis not present

## 2015-08-07 DIAGNOSIS — O09813 Supervision of pregnancy resulting from assisted reproductive technology, third trimester: Secondary | ICD-10-CM | POA: Insufficient documentation

## 2015-08-07 DIAGNOSIS — O09213 Supervision of pregnancy with history of pre-term labor, third trimester: Secondary | ICD-10-CM

## 2015-08-07 DIAGNOSIS — Z3A34 34 weeks gestation of pregnancy: Secondary | ICD-10-CM | POA: Insufficient documentation

## 2015-08-07 DIAGNOSIS — O409XX0 Polyhydramnios, unspecified trimester, not applicable or unspecified: Secondary | ICD-10-CM

## 2015-08-07 DIAGNOSIS — O24113 Pre-existing diabetes mellitus, type 2, in pregnancy, third trimester: Secondary | ICD-10-CM

## 2015-08-07 DIAGNOSIS — O3663X0 Maternal care for excessive fetal growth, third trimester, not applicable or unspecified: Secondary | ICD-10-CM

## 2015-08-07 DIAGNOSIS — O09893 Supervision of other high risk pregnancies, third trimester: Secondary | ICD-10-CM

## 2015-08-07 DIAGNOSIS — O403XX0 Polyhydramnios, third trimester, not applicable or unspecified: Secondary | ICD-10-CM | POA: Diagnosis not present

## 2015-08-10 ENCOUNTER — Encounter: Payer: Self-pay | Admitting: Obstetrics and Gynecology

## 2015-08-10 ENCOUNTER — Ambulatory Visit (INDEPENDENT_AMBULATORY_CARE_PROVIDER_SITE_OTHER): Payer: 59 | Admitting: Obstetrics and Gynecology

## 2015-08-10 ENCOUNTER — Encounter: Payer: Self-pay | Admitting: Pharmacist

## 2015-08-10 ENCOUNTER — Other Ambulatory Visit: Payer: Self-pay | Admitting: Pharmacist

## 2015-08-10 VITALS — BP 98/60 | Wt 178.2 lb

## 2015-08-10 VITALS — BP 112/78

## 2015-08-10 DIAGNOSIS — E109 Type 1 diabetes mellitus without complications: Secondary | ICD-10-CM

## 2015-08-10 DIAGNOSIS — O3663X0 Maternal care for excessive fetal growth, third trimester, not applicable or unspecified: Secondary | ICD-10-CM

## 2015-08-10 DIAGNOSIS — O09899 Supervision of other high risk pregnancies, unspecified trimester: Secondary | ICD-10-CM

## 2015-08-10 DIAGNOSIS — O09213 Supervision of pregnancy with history of pre-term labor, third trimester: Secondary | ICD-10-CM

## 2015-08-10 DIAGNOSIS — O24414 Gestational diabetes mellitus in pregnancy, insulin controlled: Secondary | ICD-10-CM

## 2015-08-10 DIAGNOSIS — O403XX1 Polyhydramnios, third trimester, fetus 1: Secondary | ICD-10-CM

## 2015-08-10 DIAGNOSIS — O09293 Supervision of pregnancy with other poor reproductive or obstetric history, third trimester: Secondary | ICD-10-CM

## 2015-08-10 DIAGNOSIS — O360130 Maternal care for anti-D [Rh] antibodies, third trimester, not applicable or unspecified: Secondary | ICD-10-CM

## 2015-08-10 DIAGNOSIS — O09893 Supervision of other high risk pregnancies, third trimester: Secondary | ICD-10-CM | POA: Insufficient documentation

## 2015-08-10 NOTE — Progress Notes (Signed)
Subjective:  Nicholes MangoMichele J Kuznia is a 30 y.o. [phone] at 7236w3d being seen today for ongoing prenatal care.  She is currently monitored for the following issues for this high-risk pregnancy and has Type 1 diabetes mellitus (HCC); Infertility female; Supervision of other high risk pregnancies, unspecified trimester; Preexisting diabetes complicating pregnancy, antepartum; Rh negative, antepartum; Allergic rhinitis; History of shoulder dystocia in prior pregnancy, currently pregnant in third trimester; Polyhydramnios in third trimester, antepartum; and Large for dates fetus affecting pregnancy in third trimester, antepartum on her problem list.  Patient reports some pressure. No decreased FM or PTL s/s.   The following portions of the patient's history were reviewed and updated as appropriate: allergies, current medications, past family history, past medical history, past social history, past surgical history and problem list. Problem list updated.  Objective:  There were no vitals filed for this visit.  Fetal Status:           General:  Alert, oriented and cooperative. Patient is in no acute distress.  Skin: Skin is warm and dry. No rash noted.   Cardiovascular: Normal heart rate noted  Respiratory: Normal respiratory effort, no problems with respiration noted  Abdomen: Soft, gravid, appropriate for gestational age.       Pelvic:  Cervical exam deferred        Extremities: Normal range of motion.     Mental Status: Normal mood and affect. Normal behavior. Normal judgment and thought content.   Urinalysis:      Assessment and Plan:  Pregnancy: [id number] at 4136w3d  1. Insulin controlled gestational diabetes mellitus in third trimester Dr. Reuel DerbySollum Endocrine note from 6/16 reviewed and pump settings slight titrated up. Patient states better a1c control in the low 5s rNST today Continue with 2x/week testing - Fetal nonstress test  2. Type 1 diabetes mellitus without complication (HCC) See  above  3. Supervision of other high risk pregnancies, unspecified trimester Routine care  4. Rh negative, antepartum, third trimester, not applicable or unspecified fetus Rhogam PP PRN  5. Polyhydramnios in third trimester, antepartum, fetus 1 U/s last week showed LGA at approximately 4100gm and poly in the 30s D/w pt r/b/a and recommendations and she is amenable for primary c-section D/w Dr. Sherrie Georgeecker at [facility] and she recommends 38wk delivery and doesn't recommend therapeutic tap given poly, increased risk of PTB given her 35wks PPROM in 2015; most recent u/s back then didn't show poly  6. Large for dates fetus affecting pregnancy in third trimester, antepartum See above  7. History of shoulder dystocia in prior pregnancy, currently pregnant in third trimester See above  Preterm labor symptoms and general obstetric precautions including but not limited to vaginal bleeding, contractions, leaking of fluid and fetal movement were reviewed in detail with the patient. Please refer to After Visit Summary for other counseling recommendations.   [place] Bingharlie Kyriaki Moder, MD

## 2015-08-10 NOTE — Patient Outreach (Signed)
Subjective:  Patient presents today for 6 month diabetes follow-up as part of the employer-sponsored [employer] program.  Current diabetes regimen includes Humalog via insulin pump.  Patient also continues on daily ASA (from her OB to prevent preeclampsia).  Most recent MD follow-up was with Dr. Tedd SiasSolum last week. A1C at that visit was 6.4%. Patient has a pending appt for 3 weeks (unless she has the baby early). No med changes at this time.   Patient is [redacted] weeks pregnant. She is experiencing polyhydraminos currently.   Assessment:  Diabetes: Most recent A1C was 6.4% which is at goal of less than 7%. Weight is increased from last visit with me due to pregnancy.   CBG Review: Patient has been wearing a CGM and has downloaded  these results for her endocrinologist in order to make pump adjustments.   Patient denies hypoglycemia. She states that she has had some high readings. She is correcting these with boluses via insulin pump.   I downloaded patient's meter to [facility] [employer]. No visible patterns seen- no major variations seen- most of the readings are within range. Only 48% of readings are above range.   Lifestyle improvements:  Physical Activity-  Patient states that she is walking 3 days a week for 30 minutes. She states that it has been challenging to get exercise in as she progresses in pregnancy.   Nutrition-  No major changes.    Follow up with me in 6 months after patient has had her baby and I am back from maternity leave.    Plan/Goals for Next Visit:  1. Keep checking your blood sugar frequently throughout the day and respond to any high readings that you have.  2. Keep walking while you can and then after the baby is born, start walking when you are able.     Next appointment to see me is: Friday December 22nd at 9 AM.    Karen Alexander, PharmD, BCPS, CDE [facility] Clinical Pharmacist & Diabetes Care Coordinator [phone]

## 2015-08-13 ENCOUNTER — Encounter (HOSPITAL_COMMUNITY): Payer: Self-pay | Admitting: *Deleted

## 2015-08-14 ENCOUNTER — Ambulatory Visit (INDEPENDENT_AMBULATORY_CARE_PROVIDER_SITE_OTHER): Payer: 59 | Admitting: *Deleted

## 2015-08-14 VITALS — BP 99/65 | HR 82 | Wt 180.0 lb

## 2015-08-14 DIAGNOSIS — O360130 Maternal care for anti-D [Rh] antibodies, third trimester, not applicable or unspecified: Secondary | ICD-10-CM

## 2015-08-14 DIAGNOSIS — O24013 Pre-existing diabetes mellitus, type 1, in pregnancy, third trimester: Secondary | ICD-10-CM

## 2015-08-14 DIAGNOSIS — O403XX1 Polyhydramnios, third trimester, fetus 1: Secondary | ICD-10-CM

## 2015-08-14 DIAGNOSIS — O3663X0 Maternal care for excessive fetal growth, third trimester, not applicable or unspecified: Secondary | ICD-10-CM

## 2015-08-14 DIAGNOSIS — O09293 Supervision of pregnancy with other poor reproductive or obstetric history, third trimester: Secondary | ICD-10-CM

## 2015-08-14 DIAGNOSIS — O09899 Supervision of other high risk pregnancies, unspecified trimester: Secondary | ICD-10-CM

## 2015-08-14 NOTE — Progress Notes (Signed)
NST performed today was reviewed and was found to be reactive.  Continue recommended antenatal testing and prenatal care.  

## 2015-08-15 DIAGNOSIS — E109 Type 1 diabetes mellitus without complications: Secondary | ICD-10-CM | POA: Diagnosis not present

## 2015-08-17 ENCOUNTER — Ambulatory Visit (INDEPENDENT_AMBULATORY_CARE_PROVIDER_SITE_OTHER): Payer: 59 | Admitting: Obstetrics and Gynecology

## 2015-08-17 VITALS — BP 106/72 | HR 87 | Wt 180.0 lb

## 2015-08-17 DIAGNOSIS — O24424 Gestational diabetes mellitus in childbirth, insulin controlled: Secondary | ICD-10-CM

## 2015-08-17 DIAGNOSIS — O09293 Supervision of pregnancy with other poor reproductive or obstetric history, third trimester: Secondary | ICD-10-CM

## 2015-08-17 DIAGNOSIS — E109 Type 1 diabetes mellitus without complications: Secondary | ICD-10-CM

## 2015-08-17 DIAGNOSIS — O09213 Supervision of pregnancy with history of pre-term labor, third trimester: Secondary | ICD-10-CM

## 2015-08-17 DIAGNOSIS — O403XX0 Polyhydramnios, third trimester, not applicable or unspecified: Secondary | ICD-10-CM

## 2015-08-17 DIAGNOSIS — O09899 Supervision of other high risk pregnancies, unspecified trimester: Secondary | ICD-10-CM

## 2015-08-17 DIAGNOSIS — O09893 Supervision of other high risk pregnancies, third trimester: Secondary | ICD-10-CM

## 2015-08-17 DIAGNOSIS — O24313 Unspecified pre-existing diabetes mellitus in pregnancy, third trimester: Secondary | ICD-10-CM

## 2015-08-17 DIAGNOSIS — O360130 Maternal care for anti-D [Rh] antibodies, third trimester, not applicable or unspecified: Secondary | ICD-10-CM

## 2015-08-17 NOTE — Progress Notes (Signed)
Subjective:  Nicholes MangoMichele J Vasconez is a 30 y.o. [phone] at 4243w3d being seen today for ongoing prenatal care.  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  Patient reports no complaints.  Contractions: Not present. Vag. Bleeding: None.  Movement: Present. Denies leaking of fluid.   The following portions of the patient's history were reviewed and updated as appropriate: allergies, current medications, past family history, past medical history, past social history, past surgical history and problem list. Problem list updated.  Objective:   Filed Vitals:   08/17/15 0954  BP: 106/72  Pulse: 87  Weight: 180 lb (81.647 kg)    Fetal Status: Fetal Heart Rate (bpm): NST   Movement: Present     General:  Alert, oriented and cooperative. Patient is in no acute distress.  Skin: Skin is warm and dry. No rash noted.   Cardiovascular: Normal heart rate noted  Respiratory: Normal respiratory effort, no problems with respiration noted  Abdomen: Soft, gravid, appropriate for gestational age. Pain/Pressure: Absent     Pelvic: Cervical exam deferred        Extremities: Normal range of motion.  Edema: Moderate pitting, indentation subsides rapidly  Mental Status: Normal mood and affect. Normal behavior. Normal judgment and thought content.   Urinalysis:      Assessment and Plan:  Pregnancy: [id number] at 5843w3d  1. Supervision of other high risk pregnancies,  unspecified trimester Patient is doing well. Patient has been scheduled for PCS at 38 weeks as per conversation between Dr. Vergie LivingPickens and Dr, Sherrie Georgeecker (MFM) attending [facility] next visit - Fetal nonstress test- reviewed and reactive  2. History of shoulder dystocia in prior pregnancy, currently pregnant in third trimester Scheduled for c-section  3. Polyhydramnios in third trimester, antepartum, not applicable or unspecified fetus Persistent on 6/20 scan  4. Preexisting diabetes complicating pregnancy, antepartum, third trimester Patient did not bring log but reports fasting in 110's and pp as high as 150. She states endocrinologist made adjustment to her pump and fasting 68 this morning  5. Type 1 diabetes mellitus without complication (HCC)   6. Rh negative, antepartum, third trimester, not applicable or unspecified fetus S/p rhogam  7. History of preterm delivery, currently pregnant in third trimester   Preterm labor symptoms and general obstetric precautions including but not limited to vaginal bleeding, contractions, leaking of fluid and fetal movement were reviewed in detail with the patient. Please refer to After Visit Summary for other counseling recommendations.  Return in about 1 week (around 08/24/2015) for ROB with NST.   Catalina AntiguaPeggy Taos Tapp, MD

## 2015-08-20 ENCOUNTER — Ambulatory Visit (INDEPENDENT_AMBULATORY_CARE_PROVIDER_SITE_OTHER): Payer: 59 | Admitting: *Deleted

## 2015-08-20 VITALS — BP 104/62 | HR 92 | Wt 182.0 lb

## 2015-08-20 DIAGNOSIS — O403XX0 Polyhydramnios, third trimester, not applicable or unspecified: Secondary | ICD-10-CM

## 2015-08-20 DIAGNOSIS — O360130 Maternal care for anti-D [Rh] antibodies, third trimester, not applicable or unspecified: Secondary | ICD-10-CM

## 2015-08-20 DIAGNOSIS — O3663X0 Maternal care for excessive fetal growth, third trimester, not applicable or unspecified: Secondary | ICD-10-CM

## 2015-08-20 DIAGNOSIS — O24013 Pre-existing diabetes mellitus, type 1, in pregnancy, third trimester: Secondary | ICD-10-CM | POA: Diagnosis not present

## 2015-08-20 DIAGNOSIS — O09899 Supervision of other high risk pregnancies, unspecified trimester: Secondary | ICD-10-CM

## 2015-08-20 DIAGNOSIS — O09293 Supervision of pregnancy with other poor reproductive or obstetric history, third trimester: Secondary | ICD-10-CM

## 2015-08-20 NOTE — Progress Notes (Signed)
NST reviewed and reactive.  

## 2015-08-22 ENCOUNTER — Other Ambulatory Visit: Payer: Self-pay | Admitting: Obstetrics & Gynecology

## 2015-08-23 ENCOUNTER — Ambulatory Visit (INDEPENDENT_AMBULATORY_CARE_PROVIDER_SITE_OTHER): Payer: 59 | Admitting: Obstetrics & Gynecology

## 2015-08-23 VITALS — BP 106/69 | HR 103 | Wt 184.0 lb

## 2015-08-23 DIAGNOSIS — O24013 Pre-existing diabetes mellitus, type 1, in pregnancy, third trimester: Secondary | ICD-10-CM | POA: Diagnosis not present

## 2015-08-23 DIAGNOSIS — O09893 Supervision of other high risk pregnancies, third trimester: Secondary | ICD-10-CM

## 2015-08-23 DIAGNOSIS — Z113 Encounter for screening for infections with a predominantly sexual mode of transmission: Secondary | ICD-10-CM

## 2015-08-23 DIAGNOSIS — Z36 Encounter for antenatal screening of mother: Secondary | ICD-10-CM | POA: Diagnosis not present

## 2015-08-23 DIAGNOSIS — O403XX1 Polyhydramnios, third trimester, fetus 1: Secondary | ICD-10-CM | POA: Diagnosis not present

## 2015-08-23 DIAGNOSIS — O3663X0 Maternal care for excessive fetal growth, third trimester, not applicable or unspecified: Secondary | ICD-10-CM

## 2015-08-23 DIAGNOSIS — O24913 Unspecified diabetes mellitus in pregnancy, third trimester: Secondary | ICD-10-CM | POA: Diagnosis not present

## 2015-08-23 DIAGNOSIS — O09899 Supervision of other high risk pregnancies, unspecified trimester: Secondary | ICD-10-CM

## 2015-08-23 DIAGNOSIS — O24313 Unspecified pre-existing diabetes mellitus in pregnancy, third trimester: Secondary | ICD-10-CM

## 2015-08-23 NOTE — Addendum Note (Signed)
Addended by: Jaynie CollinsANYANWU, UGONNA A on: 08/23/2015 10:10 AM   Modules accepted: Orders, Medications

## 2015-08-23 NOTE — Patient Instructions (Signed)
Return to clinic for any scheduled appointments or obstetric concerns, or go to [facility] for evaluation  

## 2015-08-23 NOTE — Progress Notes (Addendum)
  Subjective:  Karen Alexander is a 30 y.o. [phone] at 7239w2d being seen today for ongoing prenatal care.  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  Patient reports no complaints.  Contractions: Not present. Vag. Bleeding: None.  Movement: Present. Denies leaking of fluid.   The following portions of the patient's history were reviewed and updated as appropriate: allergies, current medications, past family history, past medical history, past social history, past surgical history and problem list. Problem list updated.  Objective:   Filed Vitals:   08/23/15 0809  BP: 106/69  Pulse: 103  Weight: 184 lb (83.462 kg)    Fetal Status: Fetal Heart Rate (bpm): RNST Fundal Height: 46 cm Movement: Present.  Presentation: Vertex  General:  Alert, oriented and cooperative. Patient is in no acute distress.  Skin: Skin is warm and dry. No rash noted.   Cardiovascular: Normal heart rate noted  Respiratory: Normal respiratory effort, no problems with respiration noted  Abdomen: Soft, gravid, appropriate for gestational age. Pain/Pressure: Absent    Pelvic:  Cervical exam performed Dilation: 3 Effacement (%): 70 Station: -2  Extremities: Normal range of motion.  Edema: Moderate pitting, indentation subsides rapidly  Mental Status: Normal mood and affect. Normal behavior. Normal judgment and thought content.   Urinalysis: Urine Protein: 1+ Urine  Glucose: 2+ NST performed today was reviewed and was found to be reactive.  AFI is 21 cm (had a lot of deep pockets but there was cord present)  Assessment and Plan:  Pregnancy: [id number] at 6239w2d  1. Preexisting diabetes complicating pregnancy, antepartum, third trimester Will continue to follow Dr. Tedd SiasSolum recommendations. Last HgA1C on 07/30/15 was 6.4%. Continue antenatal testing - Fetal nonstress test - Amniotic fluid index - GC[facility]Chlam[facility] amp [facility] - Culture, beta strep (group b only)  2. Polyhydramnios in third trimester, antepartum, fetus 1 Not seen on AFI today, will continue weekly AFI as part of testing.  3. Large for dates fetus affecting pregnancy in third trimester, antepartum 08/07/15   [id number]  > 90%, polyhydramnios. Scheduled for cesarean delivery; also for history of shoulder dystocia. Patient instructed to discontinue aspirin therapy in preparation for delivery.  4. Supervision of other high risk pregnancies, unspecified trimester Preterm labor symptoms and general obstetric precautions including but not limited to vaginal bleeding, contractions, leaking of fluid and fetal movement were reviewed in detail with the patient. Please refer to After Visit Summary for other counseling recommendations.  Return for NST only on 08/27/15.  OB visit, NST and AFI on 08/30/15.   Tereso NewcomerUgonna A Eilee Schader, MD

## 2015-08-24 ENCOUNTER — Inpatient Hospital Stay (HOSPITAL_COMMUNITY)
Admission: AD | Admit: 2015-08-24 | Discharge: 2015-08-24 | Disposition: A | Discharge status: Home / Self Care | Payer: 59 | Source: Ambulatory Visit | Attending: Obstetrics & Gynecology | Admitting: Obstetrics & Gynecology

## 2015-08-24 DIAGNOSIS — O4703 False labor before 37 completed weeks of gestation, third trimester: Secondary | ICD-10-CM

## 2015-08-24 DIAGNOSIS — Z3A36 36 weeks gestation of pregnancy: Secondary | ICD-10-CM | POA: Insufficient documentation

## 2015-08-24 DIAGNOSIS — O2492 Unspecified diabetes mellitus in childbirth: Secondary | ICD-10-CM | POA: Diagnosis not present

## 2015-08-24 DIAGNOSIS — N898 Other specified noninflammatory disorders of vagina: Secondary | ICD-10-CM | POA: Diagnosis not present

## 2015-08-24 DIAGNOSIS — O26893 Other specified pregnancy related conditions, third trimester: Secondary | ICD-10-CM | POA: Diagnosis not present

## 2015-08-24 DIAGNOSIS — O3663X0 Maternal care for excessive fetal growth, third trimester, not applicable or unspecified: Secondary | ICD-10-CM | POA: Insufficient documentation

## 2015-08-24 DIAGNOSIS — O479 False labor, unspecified: Secondary | ICD-10-CM

## 2015-08-24 DIAGNOSIS — O24013 Pre-existing diabetes mellitus, type 1, in pregnancy, third trimester: Secondary | ICD-10-CM | POA: Diagnosis not present

## 2015-08-24 DIAGNOSIS — O09293 Supervision of pregnancy with other poor reproductive or obstetric history, third trimester: Secondary | ICD-10-CM

## 2015-08-24 LAB — WET PREP, GENITAL
Clue Cells Wet Prep HPF POC: NONE SEEN
SPERM: NONE SEEN
TRICH WET PREP: NONE SEEN
YEAST WET PREP: NONE SEEN

## 2015-08-24 LAB — GC/CHLAMYDIA PROBE AMP (~~LOC~~) NOT AT ARMC
CHLAMYDIA, DNA PROBE: NEGATIVE
NEISSERIA GONORRHEA: NEGATIVE

## 2015-08-24 NOTE — MAU Note (Signed)
Since 1730 had vag d/c that was different. White/yellow color and mucousy. For primary c/s 7/19 due to increase fld and large baby. First baby vag del at 36wks weighed 9lbs

## 2015-08-24 NOTE — Discharge Instructions (Signed)
Reasons to return to MAU: ° °1.  Contractions are  5 minutes apart or less, each last 1 minute, these have been going on for 1-2 hours, and you cannot walk or talk during them °2.  You have a large gush of fluid, or a trickle of fluid that will not stop and you have to wear a pad °3.  You have bleeding that is bright red, heavier than spotting--like menstrual bleeding (spotting can be normal in early labor or after a check of your cervix) °4.  You do not feel the baby moving like he/she normally does ° ° °Abdominal Pain During Pregnancy °Abdominal pain is common in pregnancy. Most of the time, it does not cause harm. There are many causes of abdominal pain. Some causes are more serious than others. Some of the causes of abdominal pain in pregnancy are easily diagnosed. Occasionally, the diagnosis takes time to understand. Other times, the cause is not determined. Abdominal pain can be a sign that something is very wrong with the pregnancy, or the pain may have nothing to do with the pregnancy at all. For this reason, always tell your health care provider if you have any abdominal discomfort. °HOME CARE INSTRUCTIONS  °Monitor your abdominal pain for any changes. The following actions may help to alleviate any discomfort you are experiencing: °· Do not have sexual intercourse or put anything in your vagina until your symptoms go away completely. °· Get plenty of rest until your pain improves. °· Drink clear fluids if you feel nauseous. Avoid solid food as long as you are uncomfortable or nauseous. °· Only take over-the-counter or prescription medicine as directed by your health care provider. °· Keep all follow-up appointments with your health care provider. °SEEK IMMEDIATE MEDICAL CARE IF: °· You are bleeding, leaking fluid, or passing tissue from the vagina. °· You have increasing pain or cramping. °· You have persistent vomiting. °· You have painful or bloody urination. °· You have a fever. °· You notice a decrease  in your baby's movements. °· You have extreme weakness or feel faint. °· You have shortness of breath, with or without abdominal pain. °· You develop a severe headache with abdominal pain. °· You have abnormal vaginal discharge with abdominal pain. °· You have persistent diarrhea. °· You have abdominal pain that continues even after rest, or gets worse. °MAKE SURE YOU:  °· Understand these instructions. °· Will watch your condition. °· Will get help right away if you are not doing well or get worse. °  °This information is not intended to replace advice given to you by your health care provider. Make sure you discuss any questions you have with your health care provider. °  °Document Released: 02/03/2005 Document Revised: 11/24/2012 Document Reviewed: 09/02/2012 °Elsevier Interactive Patient Education ©2016 [employer] ° °

## 2015-08-24 NOTE — MAU Provider Note (Signed)
Chief Complaint:  Vaginal Discharge   None     HPI: Karen Alexander is a 30 y.o. [phone] at 3736w3dwho presents to maternity admissions reporting onset of new yellow thicker vaginal discharge today. She denies itching or burning associated with the discharge. She is worried she could be in early labor. She is Class B/Type I DM and had large, >9lb baby with vaginal delivery and shoulder dystocia in her first labor. This baby is measuring larger than her first, 4225g/9lb5oz on 6/20 with plan for primary C/S at 38 weeks per MFM.  She reports she was 6 cm in her first labor and was not uncomfortable with contractions so is afraid of having a fast labor and not getting the C/S she plans.  She reports good fetal movement, denies LOF, vaginal bleeding, vaginal itching/burning, urinary symptoms, h/a, dizziness, n/v, or fever/chills.    HPI  Past Medical History: Past Medical History  Diagnosis Date  . Diabetes mellitus     diagnosed at age 30    Past obstetric history: OB History  Gravida Para Term Preterm AB SAB TAB Ectopic Multiple Living  3 1 0 1 1 1 0 0 0 1     # Outcome Date GA Lbr Len/2nd Weight Sex Delivery Anes PTL Lv  3 Current           2 Preterm 05/27/13 2653w6d 02:39 / 00:50 9 lb 1.3 oz (4.12 kg) M Vag-Spont EPI  Y  1 SAB               Past Surgical History: Past Surgical History  Procedure Laterality Date  . Wisdom tooth extraction  2012    x 4  . Egg retrieval  August  . Dilation and evacuation N/A 07/31/2014    Procedure: DILATATION AND EVACUATION;  Surgeon: Catalina AntiguaPeggy Constant, MD;  Location: [facility];  Service: Gynecology;  Laterality: N/A;    Family History: Family History  Problem Relation Age of Onset  . Gout Father   . Hypertension Father   . Diabetes Maternal Grandmother     type2  . Hypothyroidism Paternal Grandmother   . Thyroid disease Paternal Grandmother   . Diabetes Paternal Grandfather     type 2  . Hypertension Paternal Grandfather     Social  History: Social History  Substance Use Topics  . Smoking status: Never Smoker   . Smokeless tobacco: Never Used  . Alcohol Use: No    Allergies: No Known Allergies  Meds:  Prescriptions prior to admission  Medication Sig Dispense Refill Last Dose  . B-D ULTRA-FINE 33 LANCETS MISC    Taking  . calcium carbonate (TUMS - DOSED IN MG ELEMENTAL CALCIUM) 500 MG chewable tablet Chew 2 tablets by mouth 2 (two) times daily as needed for indigestion or heartburn.   Taking  . glucose blood (BAYER CONTOUR NEXT TEST) test strip Reported on 07/31/2015   Taking  . insulin lispro (HUMALOG) 100 UNIT/ML injection USE AS DIRECTED IN INSULIN PUMP 60 UNITS PER DAY   Taking  . loratadine (CLARITIN) 10 MG tablet Take 10 mg by mouth daily.   Taking  . Prenatal Vit-Fe Fumarate-FA (MULTIVITAMIN-PRENATAL) 27-0.8 MG TABS tablet Take 1 tablet by mouth daily at 12 noon.   Taking    ROS:  Review of Systems  Constitutional: Negative for fever, chills and fatigue.  Eyes: Negative for visual disturbance.  Respiratory: Negative for shortness of breath.   Cardiovascular: Negative for chest pain.  Gastrointestinal: Negative for nausea, vomiting and abdominal  pain.  Genitourinary: Positive for vaginal discharge. Negative for dysuria, flank pain, vaginal bleeding, difficulty urinating, vaginal pain and pelvic pain.  Neurological: Negative for dizziness and headaches.  Psychiatric/Behavioral: Negative.      I have reviewed patient's Past Medical Hx, Surgical Hx, Family Hx, Social Hx, medications and allergies.   Physical Exam  Patient Vitals for the past 24 hrs:  BP Temp Pulse Resp Height Weight  08/24/15 2126 129/84 mmHg 98.1 F (36.7 C) 99 18  (1.727 m) 186 lb 6.4 oz (84.55 kg)   Constitutional: Well-developed, well-nourished female in no acute distress.  Cardiovascular: normal rate Respiratory: normal effort GI: Abd soft, non-tender, gravid appropriate for gestational age.  MS: Extremities nontender, no  edema, normal ROM Neurologic: Alert and oriented x 4.  GU: Neg CVAT.  PELVIC EXAM: Cervix pink, visually closed, without lesion, scant white creamy discharge, vaginal walls and external genitalia normal Bimanual exam: Cervix 0/long/high, firm, anterior, neg CMT, uterus nontender, nonenlarged, adnexa without tenderness, enlargement, or mass  Dilation: 3 Effacement (%): 70 Cervical Position: Middle Station: -2 Presentation: Vertex Exam by:: Janeth Rase RN  FHT:  Baseline 145 , moderate variability, accelerations present, no decelerations Contractions: irritability, mild to palpation   Labs: Results for orders placed or performed during the hospital encounter of 08/24/15 (from the past 24 hour(s))  Wet prep, genital     Status: Abnormal   Collection Time: 08/24/15  9:50 PM  Result Value Ref Range   Yeast Wet Prep HPF POC NONE SEEN NONE SEEN   Trich, Wet Prep NONE SEEN NONE SEEN   Clue Cells Wet Prep HPF POC NONE SEEN NONE SEEN   WBC, Wet Prep HPF POC MODERATE (A) NONE SEEN   Sperm NONE SEEN    A/NEG/-- (01/06 1041)  MAU Course/MDM: I have ordered labs and reviewed results. No evidence of infection with vaginal discharge. No cervical change in 1 hour in MAU, pt unchanged from recent office visit.  Discussed s/sx of labor with pt.  Reassurance provided that it is unlikely (although not impossible) that labor will progress too quickly for her planned cesarean, even if she is in advanced labor when she arrives.  Pt to come to MAU with signs of active labor or other warning signs of third trimester pregnancy, which were reviewed.  Pt states understanding.  Pt stable at time of discharge.  Assessment: 1. Threatened labor at term   2. Large for dates fetus affecting pregnancy in third trimester, antepartum   3. History of shoulder dystocia in prior pregnancy, currently pregnant in third trimester     Plan: Discharge home Labor precautions and fetal kick counts  Follow-up  Information    Follow up with [facility].   Specialty:  Obstetrics and Gynecology   Why:  As scheduled, Return to MAU as needed for emergencies   Contact information:   [address] [place] [address] [phone]        Medication List    TAKE these medications        B-D ULTRA-FINE 33 LANCETS Misc     BAYER CONTOUR NEXT TEST test strip  Generic drug:  glucose blood  Reported on 07/31/2015     calcium carbonate 500 MG chewable tablet  Commonly known as:  TUMS - dosed in mg elemental calcium  Chew 2 tablets by mouth 2 (two) times daily as needed for indigestion or heartburn.     HUMALOG 100 UNIT/ML injection  Generic drug:  insulin lispro  USE AS DIRECTED IN INSULIN PUMP 60 UNITS PER DAY     loratadine 10 MG tablet  Commonly known as:  CLARITIN  Take 10 mg by mouth daily.     multivitamin-prenatal 27-0.8 MG Tabs tablet  Take 1 tablet by mouth daily at 12 noon.        Sharen CounterLisa Leftwich-Kirby Certified Nurse-Midwife 08/24/2015 11:03 PM

## 2015-08-25 LAB — CULTURE, BETA STREP (GROUP B ONLY)

## 2015-08-27 LAB — GC/CHLAMYDIA PROBE AMP (~~LOC~~) NOT AT ARMC
Chlamydia: NEGATIVE
Neisseria Gonorrhea: NEGATIVE

## 2015-08-28 ENCOUNTER — Encounter (HOSPITAL_COMMUNITY): Payer: Self-pay

## 2015-08-28 ENCOUNTER — Ambulatory Visit (INDEPENDENT_AMBULATORY_CARE_PROVIDER_SITE_OTHER): Payer: 59 | Admitting: *Deleted

## 2015-08-28 VITALS — BP 102/69 | HR 98 | Wt 188.0 lb

## 2015-08-28 DIAGNOSIS — O24013 Pre-existing diabetes mellitus, type 1, in pregnancy, third trimester: Secondary | ICD-10-CM

## 2015-08-31 ENCOUNTER — Ambulatory Visit (INDEPENDENT_AMBULATORY_CARE_PROVIDER_SITE_OTHER): Payer: 59 | Admitting: Obstetrics and Gynecology

## 2015-08-31 VITALS — BP 106/65 | HR 86

## 2015-08-31 DIAGNOSIS — O360130 Maternal care for anti-D [Rh] antibodies, third trimester, not applicable or unspecified: Secondary | ICD-10-CM

## 2015-08-31 DIAGNOSIS — O24013 Pre-existing diabetes mellitus, type 1, in pregnancy, third trimester: Secondary | ICD-10-CM

## 2015-08-31 DIAGNOSIS — O24313 Unspecified pre-existing diabetes mellitus in pregnancy, third trimester: Secondary | ICD-10-CM

## 2015-08-31 DIAGNOSIS — O24913 Unspecified diabetes mellitus in pregnancy, third trimester: Secondary | ICD-10-CM

## 2015-08-31 DIAGNOSIS — Z4681 Encounter for fitting and adjustment of insulin pump: Secondary | ICD-10-CM | POA: Diagnosis not present

## 2015-08-31 DIAGNOSIS — O09899 Supervision of other high risk pregnancies, unspecified trimester: Secondary | ICD-10-CM

## 2015-08-31 NOTE — Progress Notes (Signed)
Prenatal Visit Note Date: 08/31/2015 [facility]  Subjective:  Karen Alexander is a 30 y.o. [phone] at 3356w3d being seen today for ongoing prenatal care.  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  Patient reports no complaints.   Contractions: Not present. Vag. Bleeding: None.  Movement: Present. Denies leaking of fluid.   The following portions of the patient's history were reviewed and updated as appropriate: allergies, current medications, past family history, past medical history, past social history, past surgical history and problem list. Problem list updated.  Objective:   Filed Vitals:   08/31/15 0906  BP: 106/65  Pulse: 86    Fetal Status: Fetal Heart Rate (bpm): NST   Movement: Present     General:  Alert, oriented and cooperative. Patient is in no acute distress.  Skin: Skin is warm and dry. No rash noted.   Cardiovascular: Normal heart rate noted  Respiratory: Normal respiratory effort, no problems with respiration noted  Abdomen: Soft, gravid, appropriate for gestational age. Pain/Pressure: Absent     Pelvic:  Cervical exam deferred        Extremities: Normal range of motion.  Edema: Deep pitting, indentation remains for a short time  Mental Status: Normal mood and affect. Normal behavior. Normal judgment and thought content.   Urinalysis:      Assessment and Plan:  Pregnancy:  [id number] at 8556w3d  1. Pre-existing type 1 diabetes mellitus during pregnancy in third trimester Normal BS values with insulin pump in place. Sees Dr. Reuel DerbySollum today to finalize settings before c-section on this upcoming Weds. rNST today and AFI to follow. Rpt NST early next week - Fetal nonstress test - Amniotic fluid index  2. Preexisting diabetes complicating pregnancy, antepartum, third trimester As abovd  3. Rh negative, antepartum, third trimester, not applicable or unspecified fetus S/p rhogam. Rpt PP PRN  4. Supervision of other high risk pregnancies, unspecified trimester Routine care. No s/s of labor currently seen one week ago in MAU and was 3cm  Term labor symptoms and general obstetric precautions including but not limited to vaginal bleeding, contractions, leaking of fluid and fetal movement were reviewed in detail with the patient. Please refer to After Visit Summary for other counseling recommendations.  2-3d RTC NST   [place] Bingharlie Orpheus [place], MD

## 2015-09-04 ENCOUNTER — Encounter (HOSPITAL_COMMUNITY)
Admission: RE | Admit: 2015-09-04 | Discharge: 2015-09-04 | Disposition: A | Discharge status: Home / Self Care | Payer: 59 | Source: Ambulatory Visit | Attending: Obstetrics & Gynecology | Admitting: Obstetrics & Gynecology

## 2015-09-04 ENCOUNTER — Encounter: Payer: Self-pay | Admitting: *Deleted

## 2015-09-04 ENCOUNTER — Ambulatory Visit (INDEPENDENT_AMBULATORY_CARE_PROVIDER_SITE_OTHER): Payer: 59 | Admitting: *Deleted

## 2015-09-04 ENCOUNTER — Other Ambulatory Visit (HOSPITAL_COMMUNITY): Payer: 59

## 2015-09-04 ENCOUNTER — Encounter (HOSPITAL_COMMUNITY): Payer: Self-pay

## 2015-09-04 VITALS — BP 110/73 | HR 85 | Wt 194.0 lb

## 2015-09-04 DIAGNOSIS — Z833 Family history of diabetes mellitus: Secondary | ICD-10-CM | POA: Diagnosis not present

## 2015-09-04 DIAGNOSIS — O24013 Pre-existing diabetes mellitus, type 1, in pregnancy, third trimester: Secondary | ICD-10-CM

## 2015-09-04 DIAGNOSIS — O3663X0 Maternal care for excessive fetal growth, third trimester, not applicable or unspecified: Secondary | ICD-10-CM | POA: Diagnosis not present

## 2015-09-04 DIAGNOSIS — O09899 Supervision of other high risk pregnancies, unspecified trimester: Secondary | ICD-10-CM

## 2015-09-04 DIAGNOSIS — O2402 Pre-existing diabetes mellitus, type 1, in childbirth: Secondary | ICD-10-CM | POA: Diagnosis not present

## 2015-09-04 DIAGNOSIS — Z794 Long term (current) use of insulin: Secondary | ICD-10-CM | POA: Diagnosis not present

## 2015-09-04 DIAGNOSIS — Z9641 Presence of insulin pump (external) (internal): Secondary | ICD-10-CM | POA: Diagnosis not present

## 2015-09-04 DIAGNOSIS — O403XX0 Polyhydramnios, third trimester, not applicable or unspecified: Secondary | ICD-10-CM | POA: Diagnosis not present

## 2015-09-04 DIAGNOSIS — Z6791 Unspecified blood type, Rh negative: Secondary | ICD-10-CM | POA: Diagnosis not present

## 2015-09-04 DIAGNOSIS — E109 Type 1 diabetes mellitus without complications: Secondary | ICD-10-CM | POA: Diagnosis not present

## 2015-09-04 DIAGNOSIS — O26893 Other specified pregnancy related conditions, third trimester: Secondary | ICD-10-CM | POA: Diagnosis not present

## 2015-09-04 DIAGNOSIS — O24913 Unspecified diabetes mellitus in pregnancy, third trimester: Secondary | ICD-10-CM

## 2015-09-04 LAB — BASIC METABOLIC PANEL
Anion gap: 6 (ref 5–15)
BUN: 7 mg/dL (ref 6–20)
CHLORIDE: 107 mmol/L (ref 101–111)
CO2: 24 mmol/L (ref 22–32)
CREATININE: 0.67 mg/dL (ref 0.44–1.00)
Calcium: 8.3 mg/dL — ABNORMAL LOW (ref 8.9–10.3)
GFR calc Af Amer: >60 mL/min (ref >60)
GFR calc non Af Amer: >60 mL/min (ref >60)
GLUCOSE: 89 mg/dL (ref 65–99)
Potassium: 3.9 mmol/L (ref 3.5–5.1)
SODIUM: 137 mmol/L (ref 135–145)

## 2015-09-04 LAB — CBC
HCT: 39.1 % (ref 36.0–46.0)
Hemoglobin: 13 g/dL (ref 12.0–15.0)
MCH: 31.1 pg (ref 26.0–34.0)
MCHC: 33.2 g/dL (ref 30.0–36.0)
MCV: 93.5 fL (ref 78.0–100.0)
PLATELETS: 161 10*3/uL (ref 150–400)
RBC: 4.18 MIL/uL (ref 3.87–5.11)
RDW: 14.5 % (ref 11.5–15.5)
WBC: 6.5 10*3/uL (ref 4.0–10.5)

## 2015-09-04 NOTE — Patient Instructions (Signed)
18 Karen Alexander  09/04/2015   Your procedure is scheduled on:  09/05/2015  [facility] through [facility].  Pick up the phone at the desk and dial [phone].   Call this number if you have problems the morning of surgery: [phone]   Remember:   Do not eat food:After Midnight.  Do not drink clear liquids: After Midnight.  Take these medicines the morning of surgery with A SIP OF WATER: none, run insulin pump at a basal rate to maintain glucose level of approx 120 while NPO   Do not wear jewelry, make-up or nail polish.  Do not wear lotions, powders, or perfumes. You may wear deodorant.  Do not shave 48 hours prior to surgery.  Do not bring valuables to the hospital.  [facility] is not   responsible for any belongings or valuables brought to the hospital.  Contacts, dentures or bridgework may not be worn into surgery.  Leave suitcase in the car. After surgery it may be brought to your room.  For patients admitted to the hospital, checkout time is 11:00 AM the day of              discharge.   Patients discharged the day of surgery will not be allowed to drive             home.  Name and phone number of your driver: na  Special Instructions:   N/A   Please read over the following fact sheets that you were given:   Anesthesia Post-op Instructions

## 2015-09-05 ENCOUNTER — Inpatient Hospital Stay (HOSPITAL_COMMUNITY)
Admission: AD | Admit: 2015-09-05 | Discharge: 2015-09-08 | DRG: 765 | Disposition: A | Discharge status: Home / Self Care | Payer: 59 | Source: Ambulatory Visit | Attending: Obstetrics & Gynecology | Admitting: Obstetrics & Gynecology

## 2015-09-05 ENCOUNTER — Encounter (HOSPITAL_COMMUNITY): Payer: Self-pay | Admitting: *Deleted

## 2015-09-05 ENCOUNTER — Inpatient Hospital Stay (HOSPITAL_COMMUNITY): Payer: 59 | Admitting: Anesthesiology

## 2015-09-05 ENCOUNTER — Encounter (HOSPITAL_COMMUNITY)
Admission: AD | Disposition: A | Discharge status: Home / Self Care | Payer: Self-pay | Source: Ambulatory Visit | Attending: Obstetrics & Gynecology

## 2015-09-05 DIAGNOSIS — O26899 Other specified pregnancy related conditions, unspecified trimester: Secondary | ICD-10-CM

## 2015-09-05 DIAGNOSIS — O360110 Maternal care for anti-D [Rh] antibodies, first trimester, not applicable or unspecified: Secondary | ICD-10-CM

## 2015-09-05 DIAGNOSIS — O2402 Pre-existing diabetes mellitus, type 1, in childbirth: Secondary | ICD-10-CM | POA: Diagnosis present

## 2015-09-05 DIAGNOSIS — Z794 Long term (current) use of insulin: Secondary | ICD-10-CM

## 2015-09-05 DIAGNOSIS — E109 Type 1 diabetes mellitus without complications: Secondary | ICD-10-CM | POA: Diagnosis present

## 2015-09-05 DIAGNOSIS — O09893 Supervision of other high risk pregnancies, third trimester: Secondary | ICD-10-CM

## 2015-09-05 DIAGNOSIS — Z3A38 38 weeks gestation of pregnancy: Secondary | ICD-10-CM

## 2015-09-05 DIAGNOSIS — Z6791 Unspecified blood type, Rh negative: Secondary | ICD-10-CM

## 2015-09-05 DIAGNOSIS — O3663X0 Maternal care for excessive fetal growth, third trimester, not applicable or unspecified: Secondary | ICD-10-CM | POA: Diagnosis present

## 2015-09-05 DIAGNOSIS — O3663X1 Maternal care for excessive fetal growth, third trimester, fetus 1: Secondary | ICD-10-CM | POA: Diagnosis not present

## 2015-09-05 DIAGNOSIS — O24013 Pre-existing diabetes mellitus, type 1, in pregnancy, third trimester: Secondary | ICD-10-CM | POA: Diagnosis not present

## 2015-09-05 DIAGNOSIS — Z833 Family history of diabetes mellitus: Secondary | ICD-10-CM

## 2015-09-05 DIAGNOSIS — O09899 Supervision of other high risk pregnancies, unspecified trimester: Secondary | ICD-10-CM

## 2015-09-05 DIAGNOSIS — Z8249 Family history of ischemic heart disease and other diseases of the circulatory system: Secondary | ICD-10-CM

## 2015-09-05 DIAGNOSIS — O403XX0 Polyhydramnios, third trimester, not applicable or unspecified: Principal | ICD-10-CM | POA: Diagnosis present

## 2015-09-05 DIAGNOSIS — Z9641 Presence of insulin pump (external) (internal): Secondary | ICD-10-CM | POA: Diagnosis present

## 2015-09-05 DIAGNOSIS — O26893 Other specified pregnancy related conditions, third trimester: Secondary | ICD-10-CM | POA: Diagnosis present

## 2015-09-05 DIAGNOSIS — O24019 Pre-existing diabetes mellitus, type 1, in pregnancy, unspecified trimester: Secondary | ICD-10-CM | POA: Diagnosis present

## 2015-09-05 DIAGNOSIS — O09293 Supervision of pregnancy with other poor reproductive or obstetric history, third trimester: Secondary | ICD-10-CM

## 2015-09-05 DIAGNOSIS — O09213 Supervision of pregnancy with history of pre-term labor, third trimester: Secondary | ICD-10-CM

## 2015-09-05 HISTORY — DX: Missed abortion: O02.1

## 2015-09-05 LAB — GLUCOSE, CAPILLARY
Glucose-Capillary: 90 mg/dL (ref 65–99)
Glucose-Capillary: 52 mg/dL — ABNORMAL LOW (ref 65–99)
Glucose-Capillary: 130 mg/dL — ABNORMAL HIGH (ref 65–99)
Glucose-Capillary: 76 mg/dL (ref 65–99)

## 2015-09-05 LAB — RPR: RPR Ser Ql: NONREACTIVE

## 2015-09-05 SURGERY — Surgical Case
Anesthesia: Spinal | Site: Abdomen

## 2015-09-05 MED ORDER — SENNOSIDES-DOCUSATE SODIUM 8.6-50 MG PO TABS
2.0000 | ORAL_TABLET | ORAL | Status: DC
  Administered 2015-09-05 – 2015-09-07 (×3): 2 via ORAL
  Filled 2015-09-05 (×3): qty 2

## 2015-09-05 MED ORDER — INSULIN PUMP
Freq: Three times a day (TID) | SUBCUTANEOUS | Status: DC
  Administered 2015-09-05: 2.3 via SUBCUTANEOUS
  Administered 2015-09-06: 0.7 via SUBCUTANEOUS
  Administered 2015-09-06: 4.1 via SUBCUTANEOUS
  Administered 2015-09-06: 0.4 via SUBCUTANEOUS
  Administered 2015-09-06: 4.2 via SUBCUTANEOUS
  Administered 2015-09-07: 0.6 via SUBCUTANEOUS
  Administered 2015-09-07: 4.3 via SUBCUTANEOUS
  Administered 2015-09-07 (×2): via SUBCUTANEOUS
  Administered 2015-09-08: 1.4 via SUBCUTANEOUS
  Filled 2015-09-05: qty 1

## 2015-09-05 MED ORDER — MEPERIDINE HCL 25 MG/ML IJ SOLN: 6.2500 mg | INTRAMUSCULAR | Status: DC | PRN

## 2015-09-05 MED ORDER — OXYTOCIN 10 UNIT/ML IJ SOLN
INTRAMUSCULAR | Status: AC
  Filled 2015-09-05: qty 4

## 2015-09-05 MED ORDER — DIPHENHYDRAMINE HCL 25 MG PO CAPS
25.0000 mg | ORAL_CAPSULE | ORAL | Status: DC | PRN
  Filled 2015-09-05: qty 1

## 2015-09-05 MED ORDER — NALOXONE HCL 2 MG/2ML IJ SOSY
1.0000 ug/kg/h | PREFILLED_SYRINGE | INTRAVENOUS | Status: DC | PRN
  Filled 2015-09-05: qty 2

## 2015-09-05 MED ORDER — PHENYLEPHRINE 8 MG IN D5W 100 ML (0.08MG/ML) PREMIX OPTIME
INJECTION | INTRAVENOUS | Status: AC
  Filled 2015-09-05: qty 100

## 2015-09-05 MED ORDER — OXYTOCIN 40 UNITS IN LACTATED RINGERS INFUSION - SIMPLE MED: 2.5000 [IU]/h | INTRAVENOUS | Status: AC

## 2015-09-05 MED ORDER — PHENYLEPHRINE 8 MG IN D5W 100 ML (0.08MG/ML) PREMIX OPTIME
INJECTION | INTRAVENOUS | Status: DC | PRN
  Administered 2015-09-05: 60 ug/min via INTRAVENOUS

## 2015-09-05 MED ORDER — FENTANYL CITRATE (PF) 100 MCG/2ML IJ SOLN
INTRAMUSCULAR | Status: AC
  Filled 2015-09-05: qty 2

## 2015-09-05 MED ORDER — PRENATAL MULTIVITAMIN CH
1.0000 | ORAL_TABLET | Freq: Every day | ORAL | Status: DC
  Administered 2015-09-06 – 2015-09-07 (×2): 1 via ORAL
  Filled 2015-09-05 (×2): qty 1

## 2015-09-05 MED ORDER — BUPIVACAINE HCL (PF) 0.5 % IJ SOLN
INTRAMUSCULAR | Status: AC
  Filled 2015-09-05: qty 30

## 2015-09-05 MED ORDER — BUPIVACAINE IN DEXTROSE 0.75-8.25 % IT SOLN
INTRATHECAL | Status: DC | PRN
  Administered 2015-09-05: 1.6 mL via INTRATHECAL

## 2015-09-05 MED ORDER — ONDANSETRON HCL 4 MG/2ML IJ SOLN: 4.0000 mg | Freq: Three times a day (TID) | INTRAMUSCULAR | Status: DC | PRN

## 2015-09-05 MED ORDER — IBUPROFEN 600 MG PO TABS
600.0000 mg | ORAL_TABLET | Freq: Four times a day (QID) | ORAL | Status: DC | PRN
  Administered 2015-09-06: 600 mg via ORAL

## 2015-09-05 MED ORDER — DIPHENHYDRAMINE HCL 50 MG/ML IJ SOLN: 12.5000 mg | INTRAMUSCULAR | Status: DC | PRN

## 2015-09-05 MED ORDER — KETOROLAC TROMETHAMINE 30 MG/ML IJ SOLN
INTRAMUSCULAR | Status: AC
  Administered 2015-09-05: 30 mg via INTRAMUSCULAR
  Filled 2015-09-05: qty 1

## 2015-09-05 MED ORDER — SCOPOLAMINE 1 MG/3DAYS TD PT72: 1.0000 | MEDICATED_PATCH | Freq: Once | TRANSDERMAL | Status: DC

## 2015-09-05 MED ORDER — OXYTOCIN 10 UNIT/ML IJ SOLN
40.0000 [IU] | INTRAVENOUS | Status: DC | PRN
  Administered 2015-09-05: 40 [IU] via INTRAVENOUS

## 2015-09-05 MED ORDER — KETOROLAC TROMETHAMINE 30 MG/ML IJ SOLN: 30.0000 mg | Freq: Once | INTRAMUSCULAR | Status: DC

## 2015-09-05 MED ORDER — ACETAMINOPHEN 500 MG PO TABS
1000.0000 mg | ORAL_TABLET | Freq: Four times a day (QID) | ORAL | Status: AC
  Administered 2015-09-05 – 2015-09-06 (×3): 1000 mg via ORAL
  Filled 2015-09-05 (×3): qty 2

## 2015-09-05 MED ORDER — ERYTHROMYCIN 5 MG/GM OP OINT
TOPICAL_OINTMENT | OPHTHALMIC | Status: AC
  Filled 2015-09-05: qty 1

## 2015-09-05 MED ORDER — ONDANSETRON HCL 4 MG/2ML IJ SOLN
INTRAMUSCULAR | Status: AC
  Filled 2015-09-05: qty 2

## 2015-09-05 MED ORDER — PHENYLEPHRINE 40 MCG/ML (10ML) SYRINGE FOR IV PUSH (FOR BLOOD PRESSURE SUPPORT)
PREFILLED_SYRINGE | INTRAVENOUS | Status: AC
  Filled 2015-09-05: qty 10

## 2015-09-05 MED ORDER — IBUPROFEN 600 MG PO TABS
600.0000 mg | ORAL_TABLET | Freq: Four times a day (QID) | ORAL | Status: DC
  Administered 2015-09-05 – 2015-09-08 (×9): 600 mg via ORAL
  Filled 2015-09-05 (×10): qty 1

## 2015-09-05 MED ORDER — MORPHINE SULFATE (PF) 0.5 MG/ML IJ SOLN
INTRAMUSCULAR | Status: AC
  Filled 2015-09-05: qty 10

## 2015-09-05 MED ORDER — OXYCODONE-ACETAMINOPHEN 5-325 MG PO TABS
1.0000 | ORAL_TABLET | ORAL | Status: DC | PRN
  Administered 2015-09-07 – 2015-09-08 (×5): 1 via ORAL
  Filled 2015-09-05 (×5): qty 1

## 2015-09-05 MED ORDER — LACTATED RINGERS IV SOLN
INTRAVENOUS | Status: DC
  Administered 2015-09-05 (×3): via INTRAVENOUS

## 2015-09-05 MED ORDER — LACTATED RINGERS IV SOLN
INTRAVENOUS | Status: DC
  Administered 2015-09-05: 18:00:00 via INTRAVENOUS

## 2015-09-05 MED ORDER — MORPHINE SULFATE (PF) 0.5 MG/ML IJ SOLN
INTRAMUSCULAR | Status: DC | PRN
  Administered 2015-09-05: .2 mg via INTRATHECAL

## 2015-09-05 MED ORDER — ONDANSETRON HCL 4 MG/2ML IJ SOLN
INTRAMUSCULAR | Status: DC | PRN
  Administered 2015-09-05: 4 mg via INTRAVENOUS

## 2015-09-05 MED ORDER — DEXTROSE 50 % IV SOLN
1.0000 | INTRAVENOUS | Status: AC
  Administered 2015-09-05: 50 mL via INTRAVENOUS

## 2015-09-05 MED ORDER — PROMETHAZINE HCL 25 MG/ML IJ SOLN: 6.2500 mg | INTRAMUSCULAR | Status: DC | PRN

## 2015-09-05 MED ORDER — BUPIVACAINE HCL (PF) 0.5 % IJ SOLN
INTRAMUSCULAR | Status: DC | PRN
  Administered 2015-09-05: 30 mL

## 2015-09-05 MED ORDER — NALBUPHINE HCL 10 MG/ML IJ SOLN: 5.0000 mg | INTRAMUSCULAR | Status: DC | PRN

## 2015-09-05 MED ORDER — DIPHENHYDRAMINE HCL 25 MG PO CAPS: 25.0000 mg | ORAL_CAPSULE | Freq: Four times a day (QID) | ORAL | Status: DC | PRN

## 2015-09-05 MED ORDER — SCOPOLAMINE 1 MG/3DAYS TD PT72
1.0000 | MEDICATED_PATCH | Freq: Once | TRANSDERMAL | Status: DC
  Administered 2015-09-05: 1.5 mg via TRANSDERMAL

## 2015-09-05 MED ORDER — TETANUS-DIPHTH-ACELL PERTUSSIS 5-2.5-18.5 LF-MCG/0.5 IM SUSP: 0.5000 mL | Freq: Once | INTRAMUSCULAR | Status: DC

## 2015-09-05 MED ORDER — NALBUPHINE HCL 10 MG/ML IJ SOLN: 5.0000 mg | Freq: Once | INTRAMUSCULAR | Status: DC | PRN

## 2015-09-05 MED ORDER — SODIUM CHLORIDE 0.9% FLUSH: 3.0000 mL | INTRAVENOUS | Status: DC | PRN

## 2015-09-05 MED ORDER — LACTATED RINGERS IV SOLN
INTRAVENOUS | Status: DC
  Administered 2015-09-05: 12:00:00 via INTRAVENOUS

## 2015-09-05 MED ORDER — SIMETHICONE 80 MG PO CHEW
80.0000 mg | CHEWABLE_TABLET | ORAL | Status: DC | PRN
  Administered 2015-09-05 – 2015-09-07 (×3): 80 mg via ORAL
  Filled 2015-09-05 (×3): qty 1

## 2015-09-05 MED ORDER — SODIUM CHLORIDE 0.9 % IR SOLN
Status: DC | PRN
  Administered 2015-09-05: 1000 mL

## 2015-09-05 MED ORDER — DEXTROSE 50 % IV SOLN
INTRAVENOUS | Status: AC
  Administered 2015-09-05: 50 mL via INTRAVENOUS
  Filled 2015-09-05: qty 50

## 2015-09-05 MED ORDER — KETOROLAC TROMETHAMINE 30 MG/ML IJ SOLN
30.0000 mg | Freq: Four times a day (QID) | INTRAMUSCULAR | Status: DC | PRN
  Administered 2015-09-05: 30 mg via INTRAMUSCULAR

## 2015-09-05 MED ORDER — CEFAZOLIN SODIUM-DEXTROSE 2-4 GM/100ML-% IV SOLN
2.0000 g | INTRAVENOUS | Status: AC
  Administered 2015-09-05: 2 g via INTRAVENOUS

## 2015-09-05 MED ORDER — FENTANYL CITRATE (PF) 100 MCG/2ML IJ SOLN
INTRAMUSCULAR | Status: DC | PRN
  Administered 2015-09-05: 20 ug via INTRATHECAL

## 2015-09-05 MED ORDER — OXYCODONE-ACETAMINOPHEN 5-325 MG PO TABS
2.0000 | ORAL_TABLET | ORAL | Status: DC | PRN
  Administered 2015-09-07: 2 via ORAL
  Filled 2015-09-05: qty 2

## 2015-09-05 MED ORDER — SCOPOLAMINE 1 MG/3DAYS TD PT72
MEDICATED_PATCH | TRANSDERMAL | Status: AC
Start: 2015-09-05 — End: 2015-09-08
  Administered 2015-09-05: 1.5 mg via TRANSDERMAL
  Filled 2015-09-05: qty 1

## 2015-09-05 MED ORDER — HYDROMORPHONE HCL 1 MG/ML IJ SOLN: 0.2500 mg | INTRAMUSCULAR | Status: DC | PRN

## 2015-09-05 MED ORDER — KETOROLAC TROMETHAMINE 30 MG/ML IJ SOLN: 30.0000 mg | Freq: Four times a day (QID) | INTRAMUSCULAR | Status: DC | PRN

## 2015-09-05 MED ORDER — COCONUT OIL OIL: 1.0000 "application " | TOPICAL_OIL | Status: DC | PRN

## 2015-09-05 MED ORDER — NALOXONE HCL 0.4 MG/ML IJ SOLN: 0.4000 mg | INTRAMUSCULAR | Status: DC | PRN

## 2015-09-05 MED ORDER — ACETAMINOPHEN 325 MG PO TABS
650.0000 mg | ORAL_TABLET | ORAL | Status: DC | PRN
  Administered 2015-09-06 (×2): 650 mg via ORAL
  Filled 2015-09-05 (×2): qty 2

## 2015-09-05 SURGICAL SUPPLY — 28 items
BENZOIN TINCTURE PRP APPL 2/3 (GAUZE/BANDAGES/DRESSINGS) ×3 IMPLANT
CHLORAPREP W/TINT 26ML (MISCELLANEOUS) ×3 IMPLANT
CLAMP CORD UMBIL (MISCELLANEOUS) ×3 IMPLANT
CLOSURE WOUND 1/2 X4 (GAUZE/BANDAGES/DRESSINGS) ×1
CLOTH BEACON ORANGE TIMEOUT ST (SAFETY) ×3 IMPLANT
DRSG OPSITE POSTOP 4X10 (GAUZE/BANDAGES/DRESSINGS) ×3 IMPLANT
ELECT REM PT RETURN 9FT ADLT (ELECTROSURGICAL) ×3
ELECTRODE REM PT RTRN 9FT ADLT (ELECTROSURGICAL) ×1 IMPLANT
GLOVE BIO SURGEON STRL SZ 6.5 (GLOVE) ×2 IMPLANT
GLOVE BIO SURGEONS STRL SZ 6.5 (GLOVE) ×1
GLOVE BIOGEL PI IND STRL 7.0 (GLOVE) ×2 IMPLANT
GLOVE BIOGEL PI INDICATOR 7.0 (GLOVE) ×4
GOWN STRL REUS W/TWL LRG LVL3 (GOWN DISPOSABLE) ×6 IMPLANT
NEEDLE HYPO 25X5/8 SAFETYGLIDE (NEEDLE) ×3 IMPLANT
NS IRRIG 1000ML POUR BTL (IV SOLUTION) ×3 IMPLANT
PACK C SECTION WH (CUSTOM PROCEDURE TRAY) ×3 IMPLANT
PAD OB MATERNITY 4.3X12.25 (PERSONAL CARE ITEMS) ×3 IMPLANT
PENCIL SMOKE EVAC W/HOLSTER (ELECTROSURGICAL) ×3 IMPLANT
RETRACTOR WND ALEXIS 25 LRG (MISCELLANEOUS) ×1 IMPLANT
RTRCTR WOUND ALEXIS 25CM LRG (MISCELLANEOUS) ×3
STRIP CLOSURE SKIN 1/2X4 (GAUZE/BANDAGES/DRESSINGS) ×2 IMPLANT
SUT VIC AB 0 CT1 36 (SUTURE) ×18 IMPLANT
SUT VIC AB 2-0 CT1 27 (SUTURE) ×2
SUT VIC AB 2-0 CT1 TAPERPNT 27 (SUTURE) ×1 IMPLANT
SUT VIC AB 4-0 PS2 27 (SUTURE) ×3 IMPLANT
SYR CONTROL 10ML LL (SYRINGE) ×3 IMPLANT
TOWEL OR 17X24 6PK STRL BLUE (TOWEL DISPOSABLE) ×3 IMPLANT
TRAY FOLEY CATH SILVER 14FR (SET/KITS/TRAYS/PACK) ×3 IMPLANT

## 2015-09-05 NOTE — Op Note (Signed)
Cesarean Section Operative Report  Karen Alexander Werling  09/05/2015  Indications: Suspected macrosomia in diabetic patient   Pre-operative Diagnosis: Suspected Macrosomia, Type 1 Diabetes, history of shoulder dystocia.   Post-operative Diagnosis: Macrosomia infant in diabetic patient   Surgeon: Surgeon(s) and Role:    * Adam PhenixJames G Arnold, MD - Primary     * Jen MowElizabeth Adeliz Tonkinson, DO - Assistant  Attending Attestation: I was present and scrubbed for the entire procedure.   Assistants: Jen MowElizabeth London Tarnowski, DO  Anesthesia: spinal    Estimated Blood Loss: 800 ml  Total IV Fluids: 2200 ml LR  Urine Output:: 100 ml clear urine in foley catheter  Specimens: None  Findings: Viable female infant in ROA presentation; Apgars 9/9; weight 13#8oz; clear amniotic fluid; intact placenta with three vessel cord; normal uterus, fallopian tubes and ovaries bilaterally.  Baby condition / location:  NICU   Complications: no complications  Indications: Karen Alexander Thalmann is a 30 y.o. [phone] with an IUP 5932w1d presenting for scheduled primary cesarean for suspected macrosomia fetus in a Type 1 diabetic patient on insulin pump with known polyhydramnios.  The risks, benefits, complications, treatment options, and exected outcomes were discussed with the patient . The patient dwith the proposed plan, giving informed consent. identified as Karen Alexander Brunke and the procedure verified as C-Section Delivery.  Procedure Details:  The patient was taken back to the operative suite where spinal anesthesia was placed.  A time out was held and the above information confirmed.   After induction of anesthesia, the patient was draped and prepped in the usual sterile manner and placed in a dorsal supine position with a leftward tilt. A Pfannenstiel incision was made and carried down through the subcutaneous tissue to the fascia. Fascial incision was made and extended transversely. The fascia was separated from the underlying rectus  tissue superiorly and inferiorly. The peritoneum was identified and entered and extended longitudinally. Alexis retractor was placed. A low transverse uterine incision was made and extended bluntly. Delivered from cephalic presentation was a viable infant with Apgars and weight as above.  After waiting 60 seconds for delayed cord cutting, the umbilical cord was clamped and cut cord blood was obtained for evaluation. Cord ph was not sent. The placenta was removed Intact and appeared normal. The uterine outline, tubes and ovaries appeared normal. The uterine incision was closed with running locked sutures of 0 Vicryl with an imbricating layer of the same.   Hemostasis was observed. The peritoneum was closed with 0 Vicryl. The rectus muscles were examined and hemostasis observed. The fascia was then reapproximated with running sutures of 0Vicryl. A total of 30 ml 0.5% Marcaine was injected subcutaneously at the margins of the incision. The subcuticular closure was performed using 0 Vicryl. The skin was closed with 4-0 Vicryl.   Instrument, sponge, and needle counts were correct prior the abdominal closure and were correct at the conclusion of the case.     Disposition: PACU - hemodynamically stable.   Maternal Condition: stable       SignedJustice Deeds: Gabrial Poppell MumawDO 09/05/2015 1:34 PM

## 2015-09-05 NOTE — Progress Notes (Addendum)
CBG 58 @ 2338. Pt took 3 glucose tabs from home. Pt asymptomatic.

## 2015-09-05 NOTE — Progress Notes (Signed)
CBG 60 @ 2300. Mom ate snack, will recheck in 15 minutes.

## 2015-09-05 NOTE — Progress Notes (Signed)
Patient changed insulin pump settings according to letter by Dr. Tedd SiasSolum (endocrinologist)

## 2015-09-05 NOTE — Anesthesia Preprocedure Evaluation (Signed)
Anesthesia Evaluation  Patient identified by MRN, date of birth, ID band Patient awake    Reviewed: Allergy & Precautions, H&P , NPO status , Patient's Chart, lab work & pertinent test results  Airway Mallampati: I  TM Distance: >3 FB Neck ROM: full    Dental no notable dental hx.    Pulmonary neg pulmonary ROS,    Pulmonary exam normal        Cardiovascular negative cardio ROS Normal cardiovascular exam     Neuro/Psych negative neurological ROS  negative psych ROS   GI/Hepatic negative GI ROS, Neg liver ROS,   Endo/Other  diabetes, Type 1, Insulin Dependent  Renal/GU negative Renal ROS     Musculoskeletal   Abdominal Normal abdominal exam  (+)   Peds  Hematology negative hematology ROS (+)   Anesthesia Other Findings   Reproductive/Obstetrics (+) Pregnancy                             Anesthesia Physical Anesthesia Plan  ASA: II  Anesthesia Plan: Spinal   Post-op Pain Management:    Induction:   Airway Management Planned:   Additional Equipment:   Intra-op Plan:   Post-operative Plan:   Informed Consent: I have reviewed the patients History and Physical, chart, labs and discussed the procedure including the risks, benefits and alternatives for the proposed anesthesia with the patient or authorized representative who has indicated his/her understanding and acceptance.     Plan Discussed with: CRNA and Surgeon  Anesthesia Plan Comments:         Anesthesia Quick Evaluation

## 2015-09-05 NOTE — H&P (Addendum)
LABOR AND DELIVERY ADMISSION HISTORY AND PHYSICAL NOTE  Karen Alexander is a 30 y.o. female [id number] with IUP at [redacted]w[redacted]d by LMP/FTUS presenting for elective PLTCS 2/2 polyhydramnios, h/o shoulder dystocia and preterm delivery, and type 1 DM- well controlled on insulin pump.   She reports positive fetal movement. She denies leakage of fluid or vaginal bleeding or contractions. Denies CP/SOB, HA, blurred vision, N/V, F/C, abdominal/epigastric pain.   Prenatal History/Complications:   Polyhydramnios, type 1 DM on insulin pump in pregnancy, h/o shoulder dystocia, h/o preterm delivery.   Past Medical History: Past Medical History  Diagnosis Date  . SVD (spontaneous vaginal delivery) 05/27/13    x 1  . Missed ab 07/31/2014    11 wks D & E  . Diabetes mellitus     diagnosed at age 18  Type 1 per Patient    Past Surgical History: Past Surgical History  Procedure Laterality Date  . Wisdom tooth extraction  2012    x 4  . Egg retrieval  August  . Dilation and evacuation N/A 07/31/2014    Procedure: DILATATION AND EVACUATION;  Surgeon: Catalina Antigua, MD;  Location: [facility];  Service: Gynecology;  Laterality: N/A;    Obstetrical History: OB History    Gravida Para Term Preterm AB TAB SAB Ectopic Multiple Living        Social History: Social History   Social History  . Marital Status: Married    Spouse Name: N/A  . Number of Children: N/A  . Years of Education: N/A   Social History Main Topics  . Smoking status: Never Smoker   . Smokeless tobacco: Never Used  . Alcohol Use: No  . Drug Use: No  . Sexual Activity:    Partners: Male    Birth Control/ Protection: None     Comment: pregnant   Other Topics Concern  . None   Social History Narrative    Family History: Family History  Problem Relation Age of Onset  . Gout Father   . Hypertension Father   . Diabetes Maternal Grandmother     type2  . Hypothyroidism Paternal Grandmother   . Thyroid disease  Paternal Grandmother   . Diabetes Paternal Grandfather     type 2  . Hypertension Paternal Grandfather     Allergies: No Known Allergies  Prescriptions prior to admission  Medication Sig Dispense Refill Last Dose  . calcium carbonate (TUMS - DOSED IN MG ELEMENTAL CALCIUM) 500 MG chewable tablet Chew 2 tablets by mouth 2 (two) times daily as needed for indigestion or heartburn.   Past Week at Unknown time  . insulin lispro (HUMALOG) 100 UNIT/ML injection USE AS DIRECTED IN INSULIN PUMP 60 UNITS PER DAY   09/05/2015 at Unknown time  . loratadine (CLARITIN) 10 MG tablet Take 10 mg by mouth daily.   09/04/2015 at Unknown time  . Prenatal Vit-Fe Fumarate-FA (MULTIVITAMIN-PRENATAL) 27-0.8 MG TABS tablet Take 1 tablet by mouth daily at 12 noon.   09/04/2015 at Unknown time  . B-D ULTRA-FINE 33 LANCETS MISC    Taking  . glucose blood (BAYER CONTOUR NEXT TEST) test strip Reported on 07/31/2015   Taking     Review of Systems   All systems reviewed and negative except as stated in HPI  Blood pressure 134/97, pulse 103, temperature 98.7 F (37.1 C), temperature source Oral, resp. rate 18, last menstrual period 12/07/2014, SpO2 100 %. General appearance: alert, appears stated  age and no distress Lungs: clear to auscultation bilaterally Heart: regular rate and rhythm Abdomen: soft, non-tender; bowel sounds normal; gravid abdomen, appears larger than EGA.  Extremities: No calf swelling or tenderness       Prenatal labs: ABO, Rh: --/--/A NEG (07/18 1026) Antibody: POS (07/18 1026) Rubella: !Error! IMMUNE RPR: Non Reactive (07/18 1025)  HBsAg: NEGATIVE (01/06 1041)  HIV: NONREACTIVE (01/06 1041)  GBS:   NEG 1 hr Glucola: N/A, controlled DM type 1 on insulin pump Genetic screening:  Not performed Anatomy US: Normal anatomy, boy, polyhydramnios, macrosomia  Prenatal Transfer Tool  Maternal Diabetes: Yes:  Diabetes Type:  Pre-pregnancy, Insulin/Medication controlled, type 1 diabetes on  insulin pump Genetic Screening: Declined, not performed Maternal Ultrasounds/Referrals: Abnormal:  Findings:   Other: Polyhydramnios, macrosomia Fetal Ultrasounds or other Referrals:  Fetal echo, Referred to Materal Fetal Medicine for type 1 diabetes w/ h/o preterm birth and shoulder dystocia; Fetal ECHO normal.  Maternal Substance Abuse:  No Significant Maternal Medications:  Meds include: Other:  Insulin, pump Significant Maternal Lab Results: None  Results for orders placed or performed during the hospital encounter of 09/05/15 (from the past 24 hour(s))  Glucose, capillary   Collection Time: 09/05/15  8:27 AM  Result Value Ref Range   Glucose-Capillary 90 65 - 99 mg/dL  Results for orders placed or performed during the hospital encounter of 09/04/15 (from the past 24 hour(s))  CBC   Collection Time: 09/04/15 10:25 AM  Result Value Ref Range   WBC 6.5 4.0 - 10.5 K/uL   RBC 4.18 3.87 - 5.11 MIL/uL   Hemoglobin 13.0 12.0 - 15.0 g/dL   HCT 21.339.1 08.636.0 - 57.846.0 %   MCV 93.5 78.0 - 100.0 fL   MCH 31.1 26.0 - 34.0 pg   MCHC 33.2 30.0 - 36.0 g/dL   RDW 46.914.5 62.911.5 - 52.815.5 %   Platelets 161 150 - 400 K/uL  RPR   Collection Time: 09/04/15 10:25 AM  Result Value Ref Range   RPR Ser Ql Non Reactive Non Reactive  Basic metabolic panel   Collection Time: 09/04/15 10:25 AM  Result Value Ref Range   Sodium 137 135 - 145 mmol/L   Potassium 3.9 3.5 - 5.1 mmol/L   Chloride 107 101 - 111 mmol/L   CO2 24 22 - 32 mmol/L   Glucose, Bld 89 65 - 99 mg/dL   BUN 7 6 - 20 mg/dL   Creatinine, Ser 4.130.67 0.44 - 1.00 mg/dL   Calcium 8.3 (L) 8.9 - 10.3 mg/dL   GFR calc non Af Amer >60 >60 mL/min   GFR calc Af Amer >60 >60 mL/min   Anion gap 6 5 - 15  Type and screen   Collection Time: 09/04/15 10:26 AM  Result Value Ref Range   ABO/RH(D) A NEG    Antibody Screen POS    Sample Expiration 09/07/2015    Antibody Identification PASSIVELY ACQUIRED ANTI-D    DAT, 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 History of preterm delivery, currently pregnant in third trimester 08/10/2015  . Polyhydramnios in third trimester, antepartum 07/10/2015  . Large for dates fetus affecting pregnancy in third trimester, antepartum 07/10/2015  . History of shoulder dystocia in prior pregnancy, currently pregnant in third trimester 06/26/2015  . Preexisting diabetes complicating pregnancy, antepartum 03/23/2015  . Rh negative, antepartum 03/23/2015  . Supervision of other high risk pregnancies, unspecified trimester 02/23/2015  . 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 Type 1 diabetes mellitus (HCC) 01/30/2010  . Allergic rhinitis 01/30/2010    Assessment: Karen Alexander is a 30 y.o. [id number] at [redacted]w[redacted]d here for elective primary cesarean section 2/2 polyhydramnios, type 1 DM on insulin pump well controlled, h/o preterm birth, h/o shoulder dystocia, macrosomia.  Anticipate cesarean delivery, recommended by Dr. Sherrie George (MFM) at [redacted] weeks EGA.  Insulin pump regimen in place for after delivery. Circumcision desired after delivery Condoms for birth control, educated about all forms and indications. Plans to bottle feed   Jen Mow, DO 09/05/2015, 9:32 AM

## 2015-09-05 NOTE — Progress Notes (Signed)
Patient has Type 1 diabetes and uses an Medtronic insulin pump with Humalog insulin for diabetes management. Patient is followed by Dr. Tedd SiasSolum (Endocrinologist) and she last saw Dr. Tedd SiasSolum on 08/31/2015. According to Dr. Pricilla HandlerSolum's office note dated 08/31/15 insulin pump settings should be:  Basal rates  12 AM -> 1 units/hr         8 AM -> 1.15 unit/hr            9 PM -> 1.25 units/hr  Bolus settings  I:C 12 AM 1:9.5, 10 AM 1:7.5, 4 PM 5  Sensitivity at 12 AM 50  Target 90-100  After delivery patient will need to adjust insulin pump settings as advised by Dr. Tedd SiasSolum if attending MD orders for patient to continue using her insulin pump for inpatient glycemic control. NURSING: please ask patient if she has insulin pump setting adjustments for post delivery. If not, patient will need to contact Dr. Tedd SiasSolum as soon as possible for recommended changes and then she will need to make the adjustments in her insulin pump. Please page diabetes coordinator for questions or concerns. Will continue to follow.  NURSING: If insulin pump order set is ordered please print off the Patient insulin pump contract and flow sheet. The insulin pump contract should be signed by the patient and then placed in the chart. The patient insulin pump flow sheet will be completed by the patient at the bedside and the RN caring for the patient will use the patient's flow sheet to document [facility] the [facility]. RN will need to complete the Nursing Insulin Pump Flowsheet at least once a shift. Patient will need to keep extra insulin pump supplies at the bedside at all times.   Thanks, Karen PennerMarie Godric Lavell, RN, MSN, CDE Diabetes Coordinator Inpatient Diabetes Program [phone] (Team Pager from 8am to 5pm) [phone] ([facility] office) [phone] [facility] office) [phone] [facility] office)

## 2015-09-05 NOTE — Anesthesia Postprocedure Evaluation (Signed)
Anesthesia Post Note  Patient: Karen MangoMichele [name]  Procedure(s) Performed: Procedure(s) (LRB): CESAREAN SECTION (N/A)  Patient location during evaluation: PACU Anesthesia Type: Spinal Level of consciousness: awake Pain management: pain level controlled Vital Signs Assessment: post-procedure vital signs reviewed and stable Respiratory status: spontaneous breathing Cardiovascular status: stable Postop Assessment: no headache, no backache, spinal receding, patient able to bend at knees and no signs of nausea or vomiting Anesthetic complications: no     Last Vitals:  Filed Vitals:   09/05/15 1400 09/05/15 1407  BP: 123/80 134/82  Pulse: 54 53  Temp:    Resp: 13 12    Last Pain: There were no vitals filed for this visit. Pain Goal: Patients Stated Pain Goal: 0 (09/05/15 1305)               Peighton Mehra JR,JOHN Susann GivensFRANKLIN

## 2015-09-05 NOTE — Transfer of Care (Signed)
Immediate Anesthesia Transfer of Care Note  Patient: Karen Alexander  Procedure(s) Performed: Procedure(s): CESAREAN SECTION (N/A)  Patient Location: PACU  Anesthesia Type:Spinal  Level of Consciousness: awake, alert , oriented and patient cooperative  Airway & Oxygen Therapy: Patient Spontanous Breathing  Post-op Assessment: Report given to RN and Post -op Vital signs reviewed and stable  Post vital signs: Reviewed and stable  Last Vitals:  Filed Vitals:   09/05/15 0831  BP: 134/97  Pulse: 103  Temp: 37.1 C  Resp: 18    Last Pain: There were no vitals filed for this visit.    Patients Stated Pain Goal: 4 (09/05/15 0831)  Complications: No apparent anesthesia complications

## 2015-09-05 NOTE — Progress Notes (Signed)
MiniMed Medtronic insulin pump infusing at a basal rate of 0.7 units per hour. Right arm infusion site clean, dry, and intact. Fingerstick CBG 76 mg/dl. prior to eating a late lunch. Patient programed and administered 2.3 units Humalog insulin per insuline pump to cover 30 gms. CHO.   Patient has signed the Patient Contract for Insulin Pump Therapy and has been instructed to record administration of insulin per the pump on the Insulin Pump Flow Sheet. Patient demonstrating compliance with this request.

## 2015-09-05 NOTE — Anesthesia Procedure Notes (Addendum)
Spinal Patient location during procedure: OR Start time: 09/05/2015 11:26 AM End time: 09/05/2015 11:29 AM Staffing Anesthesiologist: Leilani AbleHATCHETT, Tarra Pence Performed by: anesthesiologist  Preanesthetic Checklist Completed: patient identified, surgical consent, pre-op evaluation, timeout performed, IV checked, risks and benefits discussed and monitors and equipment checked Spinal Block Patient position: sitting Prep: site prepped and draped and DuraPrep Patient monitoring: heart rate, cardiac monitor, continuous pulse ox and blood pressure Approach: midline Location: L3-4 Injection technique: single-shot Needle Needle type: Pencan  Needle gauge: 24 G Needle length: 9 cm Needle insertion depth: 5 cm Assessment Sensory level: T4

## 2015-09-05 NOTE — Progress Notes (Signed)
Dr. Arby BarretteHatchett notified of CBG. Order received to administer amp of D50 and have patient change settings of insulin pump according to Endocrinologist settings which are available in chart.

## 2015-09-06 LAB — GLUCOSE, CAPILLARY
GLUCOSE-CAPILLARY: 134 mg/dL — AB (ref 65–99)
GLUCOSE-CAPILLARY: 50 mg/dL — AB (ref 65–99)
GLUCOSE-CAPILLARY: 58 mg/dL — AB (ref 65–99)
GLUCOSE-CAPILLARY: 60 mg/dL — AB (ref 65–99)
GLUCOSE-CAPILLARY: 76 mg/dL (ref 65–99)
Glucose-Capillary: 104 mg/dL — ABNORMAL HIGH (ref 65–99)
Glucose-Capillary: 104 mg/dL — ABNORMAL HIGH (ref 65–99)
Glucose-Capillary: 136 mg/dL — ABNORMAL HIGH (ref 65–99)
Glucose-Capillary: 160 mg/dL — ABNORMAL HIGH (ref 65–99)

## 2015-09-06 LAB — CBC
HEMATOCRIT: 31.7 % — AB (ref 36.0–46.0)
Hemoglobin: 10.6 g/dL — ABNORMAL LOW (ref 12.0–15.0)
MCH: 31.2 pg (ref 26.0–34.0)
MCHC: 33.4 g/dL (ref 30.0–36.0)
MCV: 93.2 fL (ref 78.0–100.0)
PLATELETS: 143 10*3/uL — AB (ref 150–400)
RBC: 3.4 MIL/uL — ABNORMAL LOW (ref 3.87–5.11)
RDW: 14.3 % (ref 11.5–15.5)
WBC: 9.1 10*3/uL (ref 4.0–10.5)

## 2015-09-06 MED ORDER — RHO D IMMUNE GLOBULIN 1500 UNIT/2ML IJ SOSY
300.0000 ug | PREFILLED_SYRINGE | Freq: Once | INTRAMUSCULAR | Status: AC
  Administered 2015-09-06: 300 ug via INTRAVENOUS
  Filled 2015-09-06: qty 2

## 2015-09-06 NOTE — Anesthesia Postprocedure Evaluation (Signed)
Anesthesia Post Note  Patient: Karen Alexander  Procedure(s) Performed: Procedure(s) (LRB): CESAREAN SECTION (N/A)  Patient location during evaluation: Mother Baby Anesthesia Type: Spinal Level of consciousness: oriented and awake and alert Pain management: pain level controlled Vital Signs Assessment: post-procedure vital signs reviewed and stable Respiratory status: spontaneous breathing and respiratory function stable Cardiovascular status: blood pressure returned to baseline and stable Postop Assessment: no headache and no backache Anesthetic complications: no     Last Vitals:  Filed Vitals:   09/06/15 0351 09/06/15 0800  BP: 133/83 136/78  Pulse: 57 58  Temp: 36.9 C 37.1 C  Resp: 16 16    Last Pain:  Filed Vitals:   09/06/15 0832  PainSc: 2    Pain Goal: Patients Stated Pain Goal: 4 (09/05/15 1833)               Junious SilkGILBERT,Solymar Grace

## 2015-09-06 NOTE — Progress Notes (Signed)
CBG @ 0000 was 50. Pt given sandwich tray and juice. Rechecked cbg @ 0045 and was 104. Pt stable and always asymptomatic. Will recheck in 2 hours.

## 2015-09-06 NOTE — Progress Notes (Signed)
CBG 160. Pt self administered bolus of 0.7u with insulin pump

## 2015-09-06 NOTE — Progress Notes (Signed)
CSW acknowledges NICU admission.    Patient screened out for psychosocial assessment since none of the following apply:  Psychosocial stressors documented in mother or baby's chart  Gestation less than 32 weeks  Code at delivery   Infant with anomalies  Family aware of social work interventions as first child was in NICU and familiar with services and process. No current needs noted at this time.  Always available as needs arise.  Please contact the Clinical Social Worker if specific needs arise, or by MOB's request.  Deretha EmoryHannah Amariona Rathje LCSW, MSW Clinical Social Work: System Insurance underwriterWide Float Coverage for [employer] NICU Clinical social worker [phone]

## 2015-09-06 NOTE — Addendum Note (Signed)
Addendum  created 09/06/15 1431 by Junious SilkMelinda Yosgar Demirjian, CRNA   Modules edited: Clinical Notes   Clinical Notes:  File: [id number]

## 2015-09-06 NOTE — Progress Notes (Signed)
Post Op CS Day 1   Subjective: no complaints, up ad lib, voiding, tolerating PO and + flatus. Pt has been spending much time in NICU with baby. All ROS were negative  Objective: Blood pressure 133/83, pulse 57, temperature 98.5 F (36.9 C), temperature source Oral, resp. rate 16, last menstrual period 12/07/2014, SpO2 95 %, unknown if currently breastfeeding.  Physical Exam:  General: alert, cooperative and no distress Lochia: appropriate Uterine Fundus: firm Incision: healing well, dressing in tact DVT Evaluation: No evidence of DVT seen on physical exam.   Recent Labs  09/04/15 1025 09/06/15 0625  HGB 13.0 10.6*  HCT 39.1 31.7*    Assessment/Plan: Plan for discharge tomorrow , overall she's doing well. Baby is in the NICU for hypoglycemia and respiratory issue.  DM1: Insulin pump, glucose this morning 134  They plan on using condoms for contraception    LOS: 1 day   Ludwin Flahive 09/06/2015, 8:00 AM

## 2015-09-07 LAB — GLUCOSE, CAPILLARY
GLUCOSE-CAPILLARY: 96 mg/dL (ref 65–99)
GLUCOSE-CAPILLARY: 139 mg/dL — AB (ref 65–99)
Glucose-Capillary: 80 mg/dL (ref 65–99)
Glucose-Capillary: 126 mg/dL — ABNORMAL HIGH (ref 65–99)

## 2015-09-07 LAB — RH IG WORKUP (INCLUDES ABO/RH)
ABO/RH(D): A NEG
FETAL SCREEN: NEGATIVE
Gestational Age(Wks): 38
UNIT DIVISION: 0

## 2015-09-07 MED ORDER — OXYCODONE-ACETAMINOPHEN 5-325 MG PO TABS: 1.0000 | ORAL_TABLET | ORAL | Status: DC | PRN

## 2015-09-07 MED ORDER — IBUPROFEN 600 MG PO TABS: 600.0000 mg | ORAL_TABLET | Freq: Four times a day (QID) | ORAL | Status: DC | PRN

## 2015-09-07 MED FILL — OXYCODONE/APAP 5-325: 5-325 | 5 days supply | Qty: 30 | Fill #0

## 2015-09-07 MED FILL — IBUPROFEN 600 MG TABLET: 600 | 7 days supply | Qty: 30 | Fill #0

## 2015-09-07 NOTE — Progress Notes (Signed)
Post-Op Day 2, CS for macrosomia  Subjective: No complaints, up ad lib, voiding and tolerating PO, passing flatus,small lochia,.plans to bottle feed, condoms.  Baby in NICU  Objective: Blood pressure 132/69, pulse 55, temperature 98.3 F (36.8 C), temperature source Oral, resp. rate 17, last menstrual period 12/07/2014, SpO2 99 %, unknown if currently breastfeeding.  Physical Exam:  General: alert, cooperative and no distress Lochia:normal flow Chest: CTAB Heart: RRR no m/r/g Abdomen: +BS, soft, nontender, dsg dry/intact Uterine Fundus: firm DVT Evaluation: No evidence of DVT seen on physical exam. Extremities: 1+ edema   Recent Labs  09/04/15 1025 09/06/15 0625  HGB 13.0 10.6*  HCT 39.1 31.7*    Assessment/Plan: Plan for discharge tomorrow   LOS: 2 days   Alexander,Karen Nay 09/07/2015, 8:05 AM

## 2015-09-07 NOTE — Progress Notes (Signed)
Post Op Day 2  Subjective:  Karen Alexander is a 30 y.o. [id number] 2227w1d s/p pLTCS for T1DM, polyhydramnios and macrosomia.  No acute events overnight.  Glucose level at 76 at 23:48, insulin withheld at midnight and 3 am. Pt denies hypoglycemic symptoms. Pt denies problems with ambulating, voiding or po intake.  She denies nausea or vomiting.  Pain is moderately controlled.  She has had flatus. She has had bowel movement.  Lochia Moderate. No active bleeding at site of incision. Plan for birth control is condoms.  Method of Feeding: Bottle  Objective: BP 132/69 mmHg  Pulse 55  Temp(Src) 98.3 F (36.8 C) (Oral)  Resp 17  SpO2 99%  LMP 12/07/2014  Breastfeeding? Unknown  Physical Exam:  General: alert, cooperative and no distress Lochia:normal flow Chest: CTAB Heart: RRR no m/r/g Abdomen: +BS, soft, nontender, fundus firm at/below umbilicus Uterine Fundus: firm DVT Evaluation: No evidence of DVT seen on physical exam. Extremities: no edema   Recent Labs  09/04/15 1025 09/06/15 0625  HGB 13.0 10.6*  HCT 39.1 31.7*    Assessment/Plan:  ASSESSMENT: Karen Alexander is a 30 y.o. [id number] 4327w1d ppd #2 s/p pLTCS doing well.   Would like be discharged tomorrow since her baby is still in the NICU.   LOS: 2 days   Karen Alexander 09/07/2015, 7:48 AM   I have seen and examined this patient and agree the above assessment.  Respiratory effort normal, lochia appropriate, legs negative,  pain level normal.  CRESENZO-DISHMAN,Emir Nack 09/07/2015 8:06 AM

## 2015-09-08 LAB — TYPE AND SCREEN
ABO/RH(D): A NEG
Antibody Screen: POSITIVE
DAT, IgG: NEGATIVE
Unit division: 0
Unit division: 0

## 2015-09-08 LAB — GLUCOSE, CAPILLARY
GLUCOSE-CAPILLARY: 76 mg/dL (ref 65–99)
GLUCOSE-CAPILLARY: 88 mg/dL (ref 65–99)

## 2015-09-08 MED ORDER — OXYCODONE-ACETAMINOPHEN 5-325 MG PO TABS: 1.0000 | ORAL_TABLET | ORAL | Status: DC | PRN

## 2015-09-08 MED ORDER — ACETAMINOPHEN 325 MG PO TABS: 650.0000 mg | ORAL_TABLET | ORAL | Status: DC | PRN

## 2015-09-08 NOTE — Discharge Summary (Signed)
OB Discharge Summary     Patient Name: Karen Alexander DOB: 1985-02-25 MRN: [id number]  Date of admission: 09/05/2015 Delivering MD: Adam Phenix   Date of discharge: 09/08/2015  Admitting diagnosis: CTX GESTATIONAL DIABETES Intrauterine pregnancy: [redacted]w[redacted]d     Secondary diagnosis:  Active Problems:   Type 1 diabetes mellitus (HCC)   Rh negative, antepartum   Polyhydramnios in third trimester, antepartum   Large for dates fetus affecting pregnancy in third trimester, antepartum   Cesarean delivery, delivered, current hospitalization  Additional problems: none     Discharge diagnosis: Term Pregnancy Delivered                                                                                                Post partum procedures: none  Augmentation:  n/a  Complications: None  Hospital course:  Sceduled C/S   30 y.o. yo [id number] at [redacted]w[redacted]d was admitted to the hospital 09/05/2015 for scheduled cesarean section with the following indication:suspected macrosomia, polyhydramnios.  Membrane Rupture Time/Date: 11:57 AM ,09/05/2015   Patient delivered a Viable infant.09/05/2015  Details of operation can be found in separate operative note.  Pateint had an uncomplicated postpartum course.  She is ambulating, tolerating a regular diet, passing flatus, and urinating well. Patient is discharged home in stable condition on  09/08/2015   T1DM: insulin pump, controlled via instructions from endocrinology  Rh neg: rhogam given          Physical exam  Filed Vitals:   09/07/15 0817 09/07/15 1231 09/07/15 1800 09/08/15 0515  BP: 117/73  135/71 126/65  Pulse: 57  57 71  Temp: 98.9 F (37.2 C)  98.5 F (36.9 C) 98.5 F (36.9 C)  TempSrc: Oral  Axillary Oral  Resp: Height:   (1.727 m)    Weight:  186 lb (84.369 kg)    SpO2:       General: alert, cooperative and no distress Lochia: appropriate Uterine Fundus: firm Incision: Healing well with no significant drainage, No  significant erythema DVT Evaluation: No cords or calf tenderness. No significant calf/ankle edema. Labs: Lab Results  Component Value Date   WBC 9.1 09/06/2015   HGB 10.6* 09/06/2015   HCT 31.7* 09/06/2015   MCV 93.2 09/06/2015   PLT 143* 09/06/2015   CMP Latest Ref Rng 09/04/2015  Glucose 65 - 99 mg/dL 89  BUN 6 - 20 mg/dL 7  Creatinine 1.47 - 8.29 mg/dL 5.62  Sodium 130 - 865 mmol/L 137  Potassium 3.5 - 5.1 mmol/L 3.9  Chloride 101 - 111 mmol/L 107  CO2 22 - 32 mmol/L 24  Calcium 8.9 - 10.3 mg/dL 8.3(L)    Discharge instruction: per After Visit Summary and "Baby and Me Booklet".  After visit meds:    Medication List    TAKE these medications        acetaminophen 325 MG tablet  Commonly known as:  TYLENOL  Take 2 tablets (650 mg total) by mouth every 4 (four) hours as needed (for pain scale < 4).     B-D ULTRA-FINE 33 LANCETS  Misc     BAYER CONTOUR NEXT TEST test strip  Generic drug:  glucose blood  Reported on 07/31/2015     calcium carbonate 500 MG chewable tablet  Commonly known as:  TUMS - dosed in mg elemental calcium  Chew 2 tablets by mouth 2 (two) times daily as needed for indigestion or heartburn.     HUMALOG 100 UNIT/ML injection  Generic drug:  insulin lispro  USE AS DIRECTED IN INSULIN PUMP 60 UNITS PER DAY     ibuprofen 600 MG tablet  Commonly known as:  ADVIL,MOTRIN  Take 1 tablet (600 mg total) by mouth every 6 (six) hours as needed for mild pain.     loratadine 10 MG tablet  Commonly known as:  CLARITIN  Take 10 mg by mouth daily.     multivitamin-prenatal 27-0.8 MG Tabs tablet  Take 1 tablet by mouth daily at 12 noon.     oxyCODONE-acetaminophen 5-325 MG tablet  Commonly known as:  PERCOCET/ROXICET  Take 1 tablet by mouth every 4 (four) hours as needed (pain scale 4-7).        Diet: routine diet  Activity: Advance as tolerated. Pelvic rest for 6 weeks.   Outpatient follow up:6 weeks Follow up Appt:Future Appointments Date Time  Provider Department Center  10/16/2015 9:30 AM Tereso Newcomer, MD [facility]  02/08/2016 9:00 AM Benard Halsted, RPH [facility] None   Follow up Visit:No Follow-up on file.  Postpartum contraception: Condoms  Newborn Data: Live born female  Birth Weight: 13 lb 8.8 oz (6145 g) APGAR: 9, 9  Baby Feeding: Bottle and Breast Disposition:NICU   09/08/2015 Silvano Bilis, MD

## 2015-09-08 NOTE — Discharge Instructions (Signed)
Cesarean Delivery, Care After  Refer to this sheet in the next few weeks. These instructions provide you with information on caring for yourself after your procedure. Your health care provider may also give you specific instructions. Your treatment has been planned according to current medical practices, but problems sometimes occur. Call your health care provider if you have any problems or questions after you go home.  HOME CARE INSTRUCTIONS   Only take over-the-counter or prescription medications as directed by your health care provider.   Do not drink alcohol, especially if you are breastfeeding or taking medication to relieve pain.   Do not chew or smoke tobacco.   Continue to use good perineal care. Good perineal care includes:    Wiping your perineum from front to back.    Keeping your perineum clean.   Check your surgical cut (incision) daily for increased redness, drainage, swelling, or separation of skin.   Clean your incision gently with soap and water every day, and then pat it dry. If your health care provider says it is okay, leave the incision uncovered. Use a bandage (dressing) if the incision is draining fluid or appears irritated. If the adhesive strips across the incision do not fall off within 7 days, carefully peel them off.   Hug a pillow when coughing or sneezing until your incision is healed. This helps to relieve pain.   Do not use tampons or douche until your health care provider says it is okay.   Shower, wash your hair, and take tub baths as directed by your health care provider.   Wear a well-fitting bra that provides breast support.   Limit wearing support panties or control-top hose.   Drink enough fluids to keep your urine clear or pale yellow.   Eat high-fiber foods such as whole grain cereals and breads, brown rice, beans, and fresh fruits and vegetables every day. These foods may help prevent or relieve constipation.   Resume activities such as climbing stairs,  driving, lifting, exercising, or traveling as directed by your health care provider.   Talk to your health care provider about resuming sexual activities. This is dependent upon your risk of infection, your rate of healing, and your comfort and desire to resume sexual activity.   Try to have someone help you with your household activities and your newborn for at least a few days after you leave the hospital.   Rest as much as possible. Try to rest or take a nap when your newborn is sleeping.   Increase your activities gradually.   Keep all of your scheduled postpartum appointments. It is very important to keep your scheduled follow-up appointments. At these appointments, your health care provider will be checking to make sure that you are healing physically and emotionally.  SEEK MEDICAL CARE IF:    You are passing large clots from your vagina. Save any clots to show your health care provider.   You have a foul smelling discharge from your vagina.   You have trouble urinating.   You are urinating frequently.   You have pain when you urinate.   You have a change in your bowel movements.   You have increasing redness, pain, or swelling near your incision.   You have pus draining from your incision.   Your incision is separating.   You have painful, hard, or reddened breasts.   You have a severe headache.   You have blurred vision or see spots.   You feel sad   or depressed.   You have thoughts of hurting yourself or your newborn.   You have questions about your care, the care of your newborn, or medications.   You are dizzy or light-headed.   You have a rash.   You have pain, redness, or swelling at the site of the removed intravenous access (IV) tube.   You have nausea or vomiting.   You stopped breastfeeding and have not had a menstrual period within 12 weeks of stopping.   You are not breastfeeding and have not had a menstrual period within 12 weeks of delivery.   You have a fever.  SEEK  IMMEDIATE MEDICAL CARE IF:   You have persistent pain.   You have chest pain.   You have shortness of breath.   You faint.   You have leg pain.   You have stomach pain.   Your vaginal bleeding saturates 2 or more sanitary pads in 1 hour.  MAKE SURE YOU:    Understand these instructions.   Will watch your condition.   Will get help right away if you are not doing well or get worse.     This information is not intended to replace advice given to you by your health care provider. Make sure you discuss any questions you have with your health care provider.     Document Released: 10/26/2001 Document Revised: 02/24/2014 Document Reviewed: 10/01/2011  Elsevier Interactive Patient Education 2016 Elsevier Inc.

## 2015-09-19 ENCOUNTER — Encounter: Payer: Self-pay | Admitting: *Deleted

## 2015-09-24 ENCOUNTER — Encounter: Payer: Self-pay | Admitting: Family Medicine

## 2015-09-28 DIAGNOSIS — Z9641 Presence of insulin pump (external) (internal): Secondary | ICD-10-CM | POA: Diagnosis not present

## 2015-09-28 DIAGNOSIS — E109 Type 1 diabetes mellitus without complications: Secondary | ICD-10-CM | POA: Diagnosis not present

## 2015-10-16 ENCOUNTER — Encounter: Payer: Self-pay | Admitting: Obstetrics & Gynecology

## 2015-10-16 ENCOUNTER — Ambulatory Visit (INDEPENDENT_AMBULATORY_CARE_PROVIDER_SITE_OTHER): Payer: Medicaid Other | Admitting: Obstetrics & Gynecology

## 2015-10-16 NOTE — Progress Notes (Signed)
    Post Partum Encounter Note  Karen Alexander is a 30 y.o. [phone] female who presents for a postpartum visit. She is 6 weeks postpartum following a low transverse Cesarean section. I have fully reviewed the prenatal and intrapartum course; patient has Type I DM managed by her her endocrinologist. The delivery was at 2163w1d gestational weeks.  Anesthesia: epidural. Postpartum course has been uneventful. 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 Baby is feeding by bottle - Carnation Good Start DHA and ARA . Bleeding thin lochia. Bowel function is normal. Bladder function is normal. Patient is not sexually active. Contraception method is condoms. Postpartum depression screening: negative.  The following portions of the patient's history were reviewed and updated as appropriate: allergies, current medications, past family history, past medical history, past social history, past surgical history and problem list.  Review of Systems Pertinent items noted in HPI and remainder of comprehensive ROS otherwise negative.   Objective:    BP 116/78 mmHg  Pulse 78  Resp 16  Ht 5\' 5"  (1.651 m)  Wt 211 lb (95.709 kg)  BMI 35.11 kg/m2  Breastfeeding? Yes  General:  alert   Breasts:  inspection negative, no nipple discharge or bleeding, no masses or nodularity palpable  Lungs: clear to auscultation bilaterally  Heart:  regular rate and rhythm  Abdomen: soft, non-tender; bowel sounds normal; no masses,  no organomegaly. Incision C/D/I   Pelvic:  not evaluated        Assessment:   Normal postpartum exam. Pap smear not done at today's visit.   Plan:   1. Contraception: condoms 2. Follow up with endocrinologist 3. Follow up as needed.   [name], MD, FACOG Attending Obstetrician & Gynecologist, [facility], [facility]

## 2015-11-20 DIAGNOSIS — E109 Type 1 diabetes mellitus without complications: Secondary | ICD-10-CM | POA: Diagnosis not present

## 2015-11-27 DIAGNOSIS — E109 Type 1 diabetes mellitus without complications: Secondary | ICD-10-CM | POA: Diagnosis not present

## 2015-11-27 DIAGNOSIS — Z9641 Presence of insulin pump (external) (internal): Secondary | ICD-10-CM | POA: Diagnosis not present

## 2015-12-28 DIAGNOSIS — E109 Type 1 diabetes mellitus without complications: Secondary | ICD-10-CM | POA: Diagnosis not present

## 2016-02-08 ENCOUNTER — Ambulatory Visit: Payer: Self-pay | Admitting: Pharmacist

## 2016-02-19 DIAGNOSIS — H5213 Myopia, bilateral: Secondary | ICD-10-CM | POA: Diagnosis not present

## 2016-02-25 DIAGNOSIS — E109 Type 1 diabetes mellitus without complications: Secondary | ICD-10-CM | POA: Diagnosis not present

## 2016-02-29 DIAGNOSIS — Z9641 Presence of insulin pump (external) (internal): Secondary | ICD-10-CM | POA: Diagnosis not present

## 2016-02-29 DIAGNOSIS — E109 Type 1 diabetes mellitus without complications: Secondary | ICD-10-CM | POA: Diagnosis not present

## 2016-05-12 DIAGNOSIS — E109 Type 1 diabetes mellitus without complications: Secondary | ICD-10-CM | POA: Diagnosis not present

## 2016-05-22 DIAGNOSIS — E109 Type 1 diabetes mellitus without complications: Secondary | ICD-10-CM | POA: Diagnosis not present

## 2016-05-29 DIAGNOSIS — E109 Type 1 diabetes mellitus without complications: Secondary | ICD-10-CM | POA: Diagnosis not present

## 2016-05-29 DIAGNOSIS — Z9641 Presence of insulin pump (external) (internal): Secondary | ICD-10-CM | POA: Diagnosis not present

## 2016-06-12 ENCOUNTER — Telehealth: Payer: Self-pay

## 2016-06-12 NOTE — Patient Outreach (Signed)
Called Karen Alexander to schedule a Link to Wellness appointment, there was no answer left message for her to call me back.

## 2016-08-01 DIAGNOSIS — E109 Type 1 diabetes mellitus without complications: Secondary | ICD-10-CM | POA: Diagnosis not present

## 2016-08-27 ENCOUNTER — Other Ambulatory Visit: Payer: Self-pay | Admitting: Pharmacist

## 2016-08-27 ENCOUNTER — Encounter: Payer: Self-pay | Admitting: Pharmacist

## 2016-08-28 DIAGNOSIS — E109 Type 1 diabetes mellitus without complications: Secondary | ICD-10-CM | POA: Diagnosis not present

## 2016-08-29 NOTE — Patient Outreach (Signed)
[facility]) Care Management  [facility]   08/29/2016  Karen Alexander 1985-03-05 [id number]  Subjective: Patient presents today for a diabetes follow-up as part of the employer-sponsored Link to Wellness program. She is now being followed at [facility] in [place].  Current diabetes regimen includes Humalog via insulin pump. She is no longer taking aspirin.  Objective:  A1c - 7.2 % on 08/28/16; 6.7% on 05/22/16  TC = 204 mg/dl; TG = 65 mg/dl; HDL 40.9 mg/dl; LDL = 811 mg/dl on 10/31/76  Vitals:   29/56/21 1508  BP: 120/76  Weight: 146 lb (66.2 kg)  Height: 1.727 m (5\' 8" )    Encounter Medications: Outpatient Encounter Prescriptions as of 08/27/2016  Medication Sig Note  . B-D ULTRA-FINE 33 LANCETS MISC  08/17/2015: Received from: [facility]  . glucose blood (BAYER CONTOUR NEXT TEST) test strip Reported on 07/31/2015 02/23/2015: Received from: [facility]  . insulin lispro (HUMALOG) 100 UNIT/ML injection USE AS DIRECTED IN INSULIN PUMP 60 UNITS PER DAY 08/22/2015: Basal rate: 1200   1.0 units/ hour 0800    1.0 units/ hour 2100    1.25 units/hour   . loratadine (CLARITIN) 10 MG tablet Take 10 mg by mouth daily.   . Prenatal Vit-Fe Fumarate-FA (MULTIVITAMIN-PRENATAL) 27-0.8 MG TABS tablet Take 1 tablet by mouth daily at 12 noon.   . [DISCONTINUED] acetaminophen (TYLENOL) 325 MG tablet Take 2 tablets (650 mg total) by mouth every 4 (four) hours as needed (for pain scale < 4). (Patient not taking: Reported on 10/16/2015)   . [DISCONTINUED] calcium carbonate (TUMS - DOSED IN MG ELEMENTAL CALCIUM) 500 MG chewable tablet Chew 2 tablets by mouth 2 (two) times daily as needed for indigestion or heartburn.   . [DISCONTINUED] ibuprofen (ADVIL,MOTRIN) 600 MG tablet Take 1 tablet (600 mg total) by mouth every 6 (six) hours as needed for mild pain.   . [DISCONTINUED] oxyCODONE-acetaminophen (PERCOCET/ROXICET) 5-325 MG tablet  Take 1 tablet by mouth every 4 (four) hours as needed (pain scale 4-7). (Patient not taking: Reported on 10/16/2015)    No facility-administered encounter medications on file as of 08/27/2016.     Functional Status: In your present state of health, do you have any difficulty performing the following activities: 08/27/2016 09/07/2015  Hearing? N N  Vision? N N  Difficulty concentrating or making decisions? N N  Walking or climbing stairs? N N  Dressing or bathing? N N  Doing errands, shopping? N N  Some recent data might be hidden    Fall/Depression Screening: Fall Risk  08/27/2016 08/07/2015 07/20/2015  Falls in the past year? No No No   PHQ 2/9 Scores 08/27/2016 06/08/2015 04/27/2015 09/08/2014  PHQ - 2 Score 0 0 0 0   [facility] CM Care Plan Problem One     Most Recent Value  Care Plan Problem One  Patient is at risk of increased A1c  Role Documenting the Problem One  Clinical Pharmacist  Care Plan for Problem One  Active  [facility] Long Term Goal   Patient will maintain A1c of less than 7% over the next 90 days as evidenced by patient report  [facility] Long Term Goal Start Date  08/27/16  Interventions for Problem One Long Term Goal  Encouraged patient to continue with her exercise routine and diet. and close glucose monitoring       Assessment:  Diabetes: Using the Medtronic Paradigm Revel 723 insulin pump. She is no longer using the  Enlite CGM. States she only using the CGM during her pregnancies. However, she continues to closely monitor her blood glucose 4-5 times per day. 24-hour total insulin = 18.4 units (basal rate). States she has only the occasional low reading and is able to correct any high readings with a bolus dose of Humalog. Fasting ranges 70-129 mg/dl; Postprandial ranges 130-179 mg/dl. Blood Pressure: within normal limits. Not currently on an ACE inhibitor or ARB. Cholesterol: TC not within goal of less than 200 mg/dl; TG within goal of less than 150 mg/dl; HDL within goal of > 50  mg/dl; LDL not within goal of less than 100 mg/dl. Not currently on a statin.  Physical Activity: Exercising 4-5 days per week at least 30 minutes per session.    Plan: 3 month follow-up with Dr. Tedd SiasSolum next week. Pharmacist to follow up with patient in 3 months to update the care plan then in 6 months at her Link To Wellness visit.    Karen Alexander, BS, PharmD [facility] [phone]

## 2016-09-04 DIAGNOSIS — Z9641 Presence of insulin pump (external) (internal): Secondary | ICD-10-CM | POA: Diagnosis not present

## 2016-09-04 DIAGNOSIS — E109 Type 1 diabetes mellitus without complications: Secondary | ICD-10-CM | POA: Diagnosis not present

## 2016-09-30 DIAGNOSIS — Z3141 Encounter for fertility testing: Secondary | ICD-10-CM | POA: Diagnosis not present

## 2016-09-30 DIAGNOSIS — N979 Female infertility, unspecified: Secondary | ICD-10-CM | POA: Diagnosis not present

## 2016-10-21 DIAGNOSIS — Z3141 Encounter for fertility testing: Secondary | ICD-10-CM | POA: Diagnosis not present

## 2016-11-19 DIAGNOSIS — E109 Type 1 diabetes mellitus without complications: Secondary | ICD-10-CM | POA: Diagnosis not present

## 2016-11-21 DIAGNOSIS — Z3141 Encounter for fertility testing: Secondary | ICD-10-CM | POA: Diagnosis not present

## 2016-11-26 DIAGNOSIS — E109 Type 1 diabetes mellitus without complications: Secondary | ICD-10-CM | POA: Diagnosis not present

## 2016-11-28 DIAGNOSIS — Z3183 Encounter for assisted reproductive fertility procedure cycle: Secondary | ICD-10-CM | POA: Diagnosis not present

## 2016-12-03 ENCOUNTER — Ambulatory Visit: Payer: Self-pay | Admitting: Pharmacist

## 2016-12-04 DIAGNOSIS — E109 Type 1 diabetes mellitus without complications: Secondary | ICD-10-CM | POA: Diagnosis not present

## 2016-12-09 DIAGNOSIS — Z3183 Encounter for assisted reproductive fertility procedure cycle: Secondary | ICD-10-CM | POA: Diagnosis not present

## 2016-12-09 DIAGNOSIS — Z3200 Encounter for pregnancy test, result unknown: Secondary | ICD-10-CM | POA: Diagnosis not present

## 2016-12-11 DIAGNOSIS — Z9641 Presence of insulin pump (external) (internal): Secondary | ICD-10-CM | POA: Diagnosis not present

## 2016-12-11 DIAGNOSIS — O24011 Pre-existing diabetes mellitus, type 1, in pregnancy, first trimester: Secondary | ICD-10-CM | POA: Diagnosis not present

## 2016-12-12 DIAGNOSIS — Z3200 Encounter for pregnancy test, result unknown: Secondary | ICD-10-CM | POA: Diagnosis not present

## 2016-12-30 DIAGNOSIS — O09811 Supervision of pregnancy resulting from assisted reproductive technology, first trimester: Secondary | ICD-10-CM | POA: Diagnosis not present

## 2016-12-30 DIAGNOSIS — Z3A00 Weeks of gestation of pregnancy not specified: Secondary | ICD-10-CM | POA: Diagnosis not present

## 2016-12-31 ENCOUNTER — Encounter: Payer: Self-pay | Admitting: Radiology

## 2016-12-31 ENCOUNTER — Other Ambulatory Visit: Payer: Self-pay | Admitting: Pharmacist

## 2016-12-31 DIAGNOSIS — Z3141 Encounter for fertility testing: Secondary | ICD-10-CM | POA: Diagnosis not present

## 2016-12-31 NOTE — Patient Outreach (Signed)
Called patient today for her 3 month Link to Wellness check-up. She is doing well with the Medtronic insulin pump.  Her last A1c was done in October and was 6.8%. She will be transitioning to [employer] for the employee wellness benefit and has already completed the health assessment.  [facility] CM Care Plan Problem One     Most Recent Value  Care Plan Problem One  Patient is at risk of increased A1c  Role Documenting the Problem One  Clinical Pharmacist  Care Plan for Problem One  Active  [facility] Long Term Goal   Patient will maintain A1c of less than 7% over the next 90 days as evidenced by patient report  [facility] Long Term Goal Start Date  08/27/16  [facility] Long Term Goal Met Date  12/31/16 [A1c 6.8% in October 2018]  Interventions for Problem One Long Term Goal  Encouraged patient to continue with her exercise routine and diet. and close glucose monitoring      Muneeb Veras K. Dicky Doe, PharmD [facility] [phone]

## 2017-01-12 DIAGNOSIS — Z9641 Presence of insulin pump (external) (internal): Secondary | ICD-10-CM | POA: Diagnosis not present

## 2017-01-12 DIAGNOSIS — O24011 Pre-existing diabetes mellitus, type 1, in pregnancy, first trimester: Secondary | ICD-10-CM | POA: Diagnosis not present

## 2017-01-13 ENCOUNTER — Encounter: Payer: Self-pay | Admitting: Obstetrics & Gynecology

## 2017-01-13 ENCOUNTER — Ambulatory Visit (INDEPENDENT_AMBULATORY_CARE_PROVIDER_SITE_OTHER): Payer: 59 | Admitting: Obstetrics & Gynecology

## 2017-01-13 ENCOUNTER — Other Ambulatory Visit (HOSPITAL_COMMUNITY)
Admission: RE | Admit: 2017-01-13 | Discharge: 2017-01-13 | Disposition: A | Discharge status: Home / Self Care | Payer: 59 | Source: Ambulatory Visit | Attending: Obstetrics & Gynecology | Admitting: Obstetrics & Gynecology

## 2017-01-13 VITALS — BP 130/85 | HR 102 | Wt 147.0 lb

## 2017-01-13 DIAGNOSIS — E039 Hypothyroidism, unspecified: Secondary | ICD-10-CM

## 2017-01-13 DIAGNOSIS — O09291 Supervision of pregnancy with other poor reproductive or obstetric history, first trimester: Secondary | ICD-10-CM

## 2017-01-13 DIAGNOSIS — IMO0001 Reserved for inherently not codable concepts without codable children: Secondary | ICD-10-CM

## 2017-01-13 DIAGNOSIS — O0991 Supervision of high risk pregnancy, unspecified, first trimester: Secondary | ICD-10-CM

## 2017-01-13 DIAGNOSIS — O24011 Pre-existing diabetes mellitus, type 1, in pregnancy, first trimester: Secondary | ICD-10-CM | POA: Diagnosis not present

## 2017-01-13 DIAGNOSIS — Z794 Long term (current) use of insulin: Secondary | ICD-10-CM

## 2017-01-13 DIAGNOSIS — Z9641 Presence of insulin pump (external) (internal): Secondary | ICD-10-CM | POA: Diagnosis not present

## 2017-01-13 DIAGNOSIS — E109 Type 1 diabetes mellitus without complications: Secondary | ICD-10-CM | POA: Diagnosis not present

## 2017-01-13 DIAGNOSIS — O09299 Supervision of pregnancy with other poor reproductive or obstetric history, unspecified trimester: Secondary | ICD-10-CM

## 2017-01-13 DIAGNOSIS — O34219 Maternal care for unspecified type scar from previous cesarean delivery: Secondary | ICD-10-CM | POA: Insufficient documentation

## 2017-01-13 DIAGNOSIS — O0990 Supervision of high risk pregnancy, unspecified, unspecified trimester: Secondary | ICD-10-CM

## 2017-01-13 DIAGNOSIS — Z3A09 9 weeks gestation of pregnancy: Secondary | ICD-10-CM | POA: Diagnosis not present

## 2017-01-13 DIAGNOSIS — O24319 Unspecified pre-existing diabetes mellitus in pregnancy, unspecified trimester: Secondary | ICD-10-CM

## 2017-01-13 DIAGNOSIS — O99280 Endocrine, nutritional and metabolic diseases complicating pregnancy, unspecified trimester: Principal | ICD-10-CM

## 2017-01-13 DIAGNOSIS — O24013 Pre-existing diabetes mellitus, type 1, in pregnancy, third trimester: Secondary | ICD-10-CM | POA: Insufficient documentation

## 2017-01-13 DIAGNOSIS — O24311 Unspecified pre-existing diabetes mellitus in pregnancy, first trimester: Secondary | ICD-10-CM

## 2017-01-13 DIAGNOSIS — O99281 Endocrine, nutritional and metabolic diseases complicating pregnancy, first trimester: Secondary | ICD-10-CM

## 2017-01-13 MED ORDER — DOXYLAMINE-PYRIDOXINE ER 20-20 MG PO TBCR: 1.0000 | EXTENDED_RELEASE_TABLET | Freq: Every day | ORAL | 3 refills | Status: DC

## 2017-01-13 NOTE — Progress Notes (Signed)
DATING AND VIABILITY SONOGRAM   Karen Alexander is a 31 y.o. year old [phone] with LMP No LMP recorded. Patient is pregnant. which would correlate to  7668w2d weeks gestation.  She has regular menstrual cycles.   She is here today for a confirmatory initial sonogram.    GESTATION: [place]     FETAL ACTIVITY:          Heart rate: 183 bpm          The fetus is active.  GESTATIONAL AGE AND  BIOMETRICS:  Gestational criteria: Estimated Date of Delivery: 08/16/17 by IVF now at 2368w2d  CROWN RUMP LENGTH           2.08cm         8.6wks                                                                               AVERAGE EGA(BY THIS SCAN):  8.6 weeks  WORKING EDD( IVF ):  08/16/2017     TECHNICIAN COMMENTS:  SLIUP measuring 8.6wks by CRL with FHR 183bpm   A copy of this report including all images has been saved and backed up to a second source for retrieval if needed. All measures and details of the anatomical scan, placentation, fluid volume and pelvic anatomy are contained in that report.  Karen Alexander 01/13/2017 2:29 PM

## 2017-01-13 NOTE — Patient Instructions (Signed)
Type 1 or Type 2 Diabetes Mellitus During Pregnancy, Diagnosis Type 1 diabetes (type 1 diabetes mellitus) and type 2 diabetes (type 2 diabetes mellitus) are long-term (chronic) diseases. Your diabetes may be caused by one or both of these problems:  Your body does not make enough of a hormone called insulin.  Your body does not respond in a normal way to insulin that it makes.  Insulin lets sugars (glucose) go into cells in the body. This gives you energy. If you have diabetes, sugars cannot get into cells. This causes high blood sugar (hyperglycemia). If diabetes is treated, it may not hurt you or your baby. Your doctor will set treatment goals for you. In general, you should have these blood sugar levels:  After not eating for a long time (fasting): 95 mg/dL (5.3 mmol/L).  After meals (postprandial): ? One hour after a meal: at or below 140 mg/dL (7.8 mmol/L). ? Two hours after a meal: at or below 120 mg/dL (6.7 mmol/L).  A1c (hemoglobin A1c) level: 6-6.5%.  Follow these instructions at home: Questions to Ask Your Doctor  You may want to ask these questions:  Do I need to meet with a diabetes educator?  Where can I find a support group for people with diabetes?  What equipment will I need to care for myself at home?  What diabetes medicines do I need? When should I take them?  How often do I need to check my blood sugar?  What number can I call if I have questions?  When is my next doctor's visit?  General instructions  Take over-the-counter and prescription medicines only as told by your doctor.  Stay at a healthy weight during pregnancy.  Keep all follow-up visits as told by your doctor. This is important. Contact a doctor if:  Your blood sugar is at or above 240 mg/dL (13.3 mmol/L).  Your blood sugar is at or above 200 mg/dL (11.1 mmol/L), and you have ketones in your pee (urine).  You have been sick or have had a fever for 2 days or more, and you are not  getting better.  You have any of these problems for more than 6 hours: ? You cannot eat or drink. ? You feel sick to your stomach (nauseous). ? You throw up (vomit). ? You have watery poop (diarrhea). Get help right away if:  Your blood sugar is lower than 54 mg/dL (3 mmol/L).  You get confused.  You have trouble: ? Thinking clearly. ? Breathing.  Your baby moves less than normal.  You have: ? Moderate or large ketone levels in your pee (urine). ? Bleeding from your vagina. ? Unusual fluid coming from your vagina. ? Early contractions. These may feel like tightness in your belly. This information is not intended to replace advice given to you by your health care provider. Make sure you discuss any questions you have with your health care provider. Document Released: 05/28/2015 Document Revised: 12/19/2015 Document Reviewed: 03/09/2015 Elsevier Interactive Patient Education  2017 Elsevier Inc.  

## 2017-01-13 NOTE — Progress Notes (Signed)
  Subjective:    Karen Alexander is being seen today for her first obstetrical visit.  This is a planned pregnancy. Pt is s/p IVF transfer ni mid Oct.  She is at 7356w2d gestation. Her obstetrical history is significant for Type I DM on an insulin pump. . Relationship with FOB: spouse, living together. Patient does intend to breast feed. Pregnancy history fully reviewed.  Pt was recently started on Levothyroxine. She believe that she was high normal on her TSH and was started by REI to imporve her chances of conceiving.  She is still on the meds.   Patient reports no complaints.  Review of Systems:   Review of Systems  Objective:     BP 130/85   Pulse (!) 102   Wt 147 lb (66.7 kg)   BMI 22.35 kg/m  Physical Exam  Exam General Appearance:    Alert, cooperative, no distress, appears stated age  Head:    Normocephalic, without obvious abnormality, atraumatic  Eyes:    conjunctiva/corneas clear, EOM's intact, both eyes  Ears:    Normal external ear canals, both ears  Nose:   Nares normal, septum midline, mucosa normal, no drainage    or sinus tenderness  Throat:   Lips, mucosa, and tongue normal; teeth and gums normal  Neck:   Supple, symmetrical, trachea midline, no adenopathy;    thyroid:  no enlargement/tenderness/nodules  Back:     Symmetric, no curvature, ROM normal, no CVA tenderness  Lungs:     Clear to auscultation bilaterally, respirations unlabored  Chest Wall:    No tenderness or deformity   Heart:    Regular rate and rhythm, S1 and S2 normal, no murmur, rub   or gallop  Breast Exam:    No tenderness, masses, or nipple abnormality  Abdomen:     Soft, non-tender, bowel sounds active all four quadrants,    no masses, no organomegaly  Genitalia:    Normal female without lesion, discharge or tenderness     Extremities:   Extremities normal, atraumatic, no cyanosis or edema  Pulses:   2+ and symmetric all extremities  Skin:   Skin color, texture, turgor normal, no rashes or  lesions      Assessment:    Pregnancy: [id number]    Patient Active Problem List   Diagnosis Date Noted  . Supervision of high risk pregnancy, antepartum 01/13/2017  . Pre-existing insulin-dependent diabetes mellitus during pregnancy, antepartum (HCC) 01/13/2017  . Previous cesarean section complicating pregnancy, antepartum condition or complication 01/13/2017  . Type 1 diabetes mellitus (HCC) 01/30/2010  h/o macrosomia in 2 prev pregnancies (>9# and >13 #'s)  Plan:     Initial labs drawn. Prenatal vitamins. Problem list reviewed and updated. AFP3 discussed: declined. Role of ultrasound in pregnancy discussed; fetal survey: requested. Amniocentesis discussed: declined. Follow up in 4 weeks. 60% of 60 min visit spent on counseling and coordination of care.  HgbA1C obtained today TSH ordered today Pt is on a baby ASA   Karen Alexander 01/13/2017

## 2017-01-13 NOTE — Progress Notes (Signed)
tshIVF Pregnancy Type 1 DM Last pap 06/2014

## 2017-01-14 LAB — OBSTETRIC PANEL, INCLUDING HIV
ANTIBODY SCREEN: NEGATIVE
BASOS: 0 %
Basophils Absolute: 0 10*3/uL (ref 0.0–0.2)
EOS (ABSOLUTE): 0.1 10*3/uL (ref 0.0–0.4)
EOS: 1 %
HEMOGLOBIN: 12.4 g/dL (ref 11.1–15.9)
HEP B S AG: NEGATIVE
HIV Screen 4th Generation wRfx: NONREACTIVE
Hematocrit: 39.1 % (ref 34.0–46.6)
IMMATURE GRANS (ABS): 0 10*3/uL (ref 0.0–0.1)
IMMATURE GRANULOCYTES: 0 %
LYMPHS: 13 %
Lymphocytes Absolute: 1 10*3/uL (ref 0.7–3.1)
MCH: 28.8 pg (ref 26.6–33.0)
MCHC: 31.7 g/dL (ref 31.5–35.7)
MCV: 91 fL (ref 79–97)
MONOCYTES: 7 %
Monocytes Absolute: 0.5 10*3/uL (ref 0.1–0.9)
NEUTROS PCT: 79 %
Neutrophils Absolute: 5.7 10*3/uL (ref 1.4–7.0)
Platelets: 271 10*3/uL (ref 150–379)
RBC: 4.31 x10E6/uL (ref 3.77–5.28)
RDW: 13 % (ref 12.3–15.4)
RH TYPE: NEGATIVE
RPR: NONREACTIVE
RUBELLA: 3.39 {index} (ref >0.99)
WBC: 7.3 10*3/uL (ref 3.4–10.8)

## 2017-01-14 LAB — GC/CHLAMYDIA PROBE AMP (~~LOC~~) NOT AT ARMC
Chlamydia: NEGATIVE
Neisseria Gonorrhea: NEGATIVE

## 2017-01-14 LAB — COMPREHENSIVE METABOLIC PANEL
A/G RATIO: 1.5 (ref 1.2–2.2)
ALT: 14 IU/L (ref 0–32)
AST: 17 IU/L (ref 0–40)
Albumin: 4.1 g/dL (ref 3.5–5.5)
Alkaline Phosphatase: 33 IU/L — ABNORMAL LOW (ref 39–117)
BUN/Creatinine Ratio: 10 (ref 9–23)
BUN: 8 mg/dL (ref 6–20)
Bilirubin Total: 0.7 mg/dL (ref 0.0–1.2)
CALCIUM: 9.1 mg/dL (ref 8.7–10.2)
CHLORIDE: 101 mmol/L (ref 96–106)
CO2: 22 mmol/L (ref 20–29)
Creatinine, Ser: 0.8 mg/dL (ref 0.57–1.00)
GFR, EST AFRICAN AMERICAN: 112 mL/min/{1.73_m2} (ref >59)
GFR, EST NON AFRICAN AMERICAN: 97 mL/min/{1.73_m2} (ref >59)
GLUCOSE: 166 mg/dL — AB (ref 65–99)
Globulin, Total: 2.8 g/dL (ref 1.5–4.5)
POTASSIUM: 4.4 mmol/L (ref 3.5–5.2)
Sodium: 138 mmol/L (ref 134–144)
TOTAL PROTEIN: 6.9 g/dL (ref 6.0–8.5)

## 2017-01-14 LAB — TSH: TSH: 1.23 u[IU]/mL (ref 0.450–4.500)

## 2017-01-14 LAB — HEMOGLOBIN A1C
ESTIMATED AVERAGE GLUCOSE: 137 mg/dL
HEMOGLOBIN A1C: 6.4 % — AB (ref 4.8–5.6)

## 2017-01-15 ENCOUNTER — Encounter: Payer: Self-pay | Admitting: Obstetrics & Gynecology

## 2017-01-15 DIAGNOSIS — Z6791 Unspecified blood type, Rh negative: Secondary | ICD-10-CM | POA: Insufficient documentation

## 2017-01-15 DIAGNOSIS — O26899 Other specified pregnancy related conditions, unspecified trimester: Secondary | ICD-10-CM

## 2017-01-15 LAB — URINE CULTURE, OB REFLEX

## 2017-01-15 LAB — CULTURE, OB URINE

## 2017-01-21 ENCOUNTER — Encounter: Payer: Self-pay | Admitting: Obstetrics & Gynecology

## 2017-01-21 ENCOUNTER — Other Ambulatory Visit: Payer: Self-pay | Admitting: *Deleted

## 2017-01-21 MED ORDER — LEVOTHYROXINE SODIUM 50 MCG PO TABS: 50.0000 ug | ORAL_TABLET | Freq: Every day | ORAL | 0 refills | Status: DC

## 2017-02-06 DIAGNOSIS — O24012 Pre-existing diabetes mellitus, type 1, in pregnancy, second trimester: Secondary | ICD-10-CM | POA: Diagnosis not present

## 2017-02-06 DIAGNOSIS — Z9641 Presence of insulin pump (external) (internal): Secondary | ICD-10-CM | POA: Diagnosis not present

## 2017-02-12 ENCOUNTER — Ambulatory Visit (INDEPENDENT_AMBULATORY_CARE_PROVIDER_SITE_OTHER): Payer: 59 | Admitting: Obstetrics & Gynecology

## 2017-02-12 VITALS — BP 120/76 | HR 125 | Wt 151.1 lb

## 2017-02-12 DIAGNOSIS — O0990 Supervision of high risk pregnancy, unspecified, unspecified trimester: Secondary | ICD-10-CM

## 2017-02-12 DIAGNOSIS — O24319 Unspecified pre-existing diabetes mellitus in pregnancy, unspecified trimester: Secondary | ICD-10-CM

## 2017-02-12 DIAGNOSIS — E109 Type 1 diabetes mellitus without complications: Secondary | ICD-10-CM

## 2017-02-12 DIAGNOSIS — IMO0001 Reserved for inherently not codable concepts without codable children: Secondary | ICD-10-CM

## 2017-02-12 DIAGNOSIS — O34219 Maternal care for unspecified type scar from previous cesarean delivery: Secondary | ICD-10-CM

## 2017-02-12 DIAGNOSIS — O0991 Supervision of high risk pregnancy, unspecified, first trimester: Secondary | ICD-10-CM

## 2017-02-12 DIAGNOSIS — Z794 Long term (current) use of insulin: Secondary | ICD-10-CM

## 2017-02-12 DIAGNOSIS — Z3689 Encounter for other specified antenatal screening: Secondary | ICD-10-CM

## 2017-02-12 NOTE — Progress Notes (Signed)
   PRENATAL VISIT NOTE  Subjective:  Karen Alexander is a 31 y.o. [id number] at 4280w4d being seen today for ongoing prenatal care.  Karen Alexander is currently monitored for the following issues for this high-risk pregnancy and has Type 1 diabetes mellitus (HCC); Supervision of high risk pregnancy, antepartum; Pre-existing insulin-dependent diabetes mellitus during pregnancy, antepartum (HCC); Previous cesarean section complicating pregnancy, antepartum condition or complication; Prior fetal macrosomia, antepartum; and Rh negative state in antepartum period on their problem list.  Patient reports no complaints.  Contractions: Not present.  .  Movement: Absent. Denies leaking of fluid.   The following portions of the patient's history were reviewed and updated as appropriate: allergies, current medications, past family history, past medical history, past social history, past surgical history and problem list. Problem list updated.  Objective:   Vitals:   02/12/17 0844  BP: 120/76  Pulse: (!) 125  Weight: 151 lb 1.6 oz (68.5 kg)    Fetal Status: Fetal Heart Rate (bpm): 159   Movement: Absent     General:  Alert, oriented and cooperative. Patient is in no acute distress.  Skin: Skin is warm and dry. No rash noted.   Cardiovascular: Normal heart rate noted  Respiratory: Normal respiratory effort, no problems with respiration noted  Abdomen: Soft, gravid, appropriate for gestational age.  Pain/Pressure: Absent     Pelvic: Cervical exam deferred        Extremities: Normal range of motion.  Edema: None  Mental Status:  Normal mood and affect. Normal behavior. Normal judgment and thought content.   Assessment and Plan:  Pregnancy: [id number] at 6780w4d  1. Pre-existing insulin-dependent diabetes mellitus during pregnancy, antepartum (HCC) 2. Type 1 diabetes mellitus without complication (HCC) Class B-Type 1-on insulin pump-managed by Endocrinology - [facility] [place]. Last A1C was  6.4% in 01/13/2017.  While pregnant Dr. Tedd SiasSolum prefers to keep A1C around 6% and Karen Alexander is seen monthly for follow up. - US MFM OB DETAIL +14 WK; Future - Protein / creatinine ratio, urine - US Fetal Echocardiography; Future [ ]  Baseline labs (CBC - normal, CMP, urine Pr:Cr - checked today, TSH 1.2, HgA1C - 6.4%).  [X]  Aspirin 81 mg daily prescribed after 12 weeks  [ ]  Fetal ECHO - ordered today [X]  Eye exam - normal as per patient [ ]  Serial growth scans 20-24-28-32-35-38 - anatomy scan ordered today [ ]  Antenatal testing starting at 32 weeks [ ]  Delivery by 39 weeks or earlier if needed Discussed implications of DM in pregnancy, need for optimizing glycemic control to decrease DM associated maternal-fetal morbidity and mortality, need for antenatal testing and frequent ultrasounds/prenatal visits. Will check baseline labs today, get DM education and DM testing supplies today.  3. Previous cesarean section complicating pregnancy, antepartum condition or complication Interested in TOLAC depending on EFW (history of 13# infant last pregnancy needing cesarean delivery)  4. Encounter for fetal anatomic survey Anatomy scan ordered - US MFM OB DETAIL +14 WK; Future  5. Supervision of high risk pregnancy, antepartum No other complaints or concerns.  Routine obstetric precautions reviewed. Declined genetic testing. Please refer to After Visit Summary for other counseling recommendations.  Return in about 4 weeks (around 03/12/2017).   Jaynie CollinsUgonna Anyanwu, MD

## 2017-03-04 ENCOUNTER — Ambulatory Visit: Payer: 59 | Admitting: Pharmacist

## 2017-03-10 ENCOUNTER — Ambulatory Visit (INDEPENDENT_AMBULATORY_CARE_PROVIDER_SITE_OTHER): Payer: 59 | Admitting: Obstetrics & Gynecology

## 2017-03-10 ENCOUNTER — Encounter: Payer: Self-pay | Admitting: Family Medicine

## 2017-03-10 VITALS — BP 124/78 | HR 97 | Wt 153.0 lb

## 2017-03-10 DIAGNOSIS — Z794 Long term (current) use of insulin: Secondary | ICD-10-CM

## 2017-03-10 DIAGNOSIS — O24319 Unspecified pre-existing diabetes mellitus in pregnancy, unspecified trimester: Principal | ICD-10-CM

## 2017-03-10 DIAGNOSIS — O0992 Supervision of high risk pregnancy, unspecified, second trimester: Secondary | ICD-10-CM

## 2017-03-10 DIAGNOSIS — H5213 Myopia, bilateral: Secondary | ICD-10-CM | POA: Diagnosis not present

## 2017-03-10 DIAGNOSIS — O24312 Unspecified pre-existing diabetes mellitus in pregnancy, second trimester: Secondary | ICD-10-CM

## 2017-03-10 DIAGNOSIS — O09219 Supervision of pregnancy with history of pre-term labor, unspecified trimester: Secondary | ICD-10-CM | POA: Insufficient documentation

## 2017-03-10 DIAGNOSIS — IMO0001 Reserved for inherently not codable concepts without codable children: Secondary | ICD-10-CM

## 2017-03-10 DIAGNOSIS — O0990 Supervision of high risk pregnancy, unspecified, unspecified trimester: Secondary | ICD-10-CM

## 2017-03-10 DIAGNOSIS — O09212 Supervision of pregnancy with history of pre-term labor, second trimester: Secondary | ICD-10-CM

## 2017-03-10 DIAGNOSIS — O09899 Supervision of other high risk pregnancies, unspecified trimester: Secondary | ICD-10-CM

## 2017-03-10 LAB — HM DIABETES EYE EXAM

## 2017-03-10 NOTE — Progress Notes (Signed)
   PRENATAL VISIT NOTE  Subjective:  Karen Alexander is a 32 y.o. [id number] at 2595w2d being seen today for ongoing prenatal care.  She is currently monitored for the following issues for this high-risk pregnancy and has Type 1 diabetes mellitus (HCC); Supervision of high risk pregnancy, antepartum; Pre-existing insulin-dependent diabetes mellitus during pregnancy, antepartum (HCC); Previous cesarean section complicating pregnancy, antepartum condition or complication; Prior fetal macrosomia, antepartum; Rh negative state in antepartum period; and History of preterm delivery at 36 weeks, currently pregnant on their problem list.  Patient reports no complaints.  Contractions: Not present.  .  Movement: Absent. Denies leaking of fluid.   The following portions of the patient's history were reviewed and updated as appropriate: allergies, current medications, past family history, past medical history, past social history, past surgical history and problem list. Problem list updated.  Objective:   Vitals:   03/10/17 1116  BP: 124/78  Pulse: 97  Weight: 153 lb (69.4 kg)    Fetal Status: Fetal Heart Rate (bpm): 154   Movement: Absent     General:  Alert, oriented and cooperative. Patient is in no acute distress.  Skin: Skin is warm and dry. No rash noted.   Cardiovascular: Normal heart rate noted  Respiratory: Normal respiratory effort, no problems with respiration noted  Abdomen: Soft, gravid, appropriate for gestational age.  Pain/Pressure: Absent     Pelvic: Cervical exam deferred        Extremities: Normal range of motion.  Edema: None  Mental Status:  Normal mood and affect. Normal behavior. Normal judgment and thought content.   Assessment and Plan:  Pregnancy: [id number] at 1095w2d  1. Pre-existing insulin-dependent diabetes mellitus during pregnancy, antepartum (HCC) Class B-Type 1-on insulin pump-managed by Endocrinology - Dr. Tedd [facility]Solum with [facility] [place]. Last A1C was 6.4% in  01/13/2017. While pregnant Dr. Tedd SiasSolum prefers to keep A1C around 6% and she is seen monthly for follow up. Will follow up recommendations.  2. History of preterm delivery at 36 weeks, currently pregnant Discussed spontaneous PTD with her, 17P was discussed. She declines this intervention for now.  3. Supervision of high risk pregnancy, antepartum No other complaints or concerns.  Routine obstetric precautions reviewed. Please refer to After Visit Summary for other counseling recommendations.  Return in about 4 weeks (around 04/07/2017) for OB Visit.   Jaynie CollinsUgonna Daryl Beehler, MD

## 2017-03-13 DIAGNOSIS — O24012 Pre-existing diabetes mellitus, type 1, in pregnancy, second trimester: Secondary | ICD-10-CM | POA: Diagnosis not present

## 2017-03-13 DIAGNOSIS — E109 Type 1 diabetes mellitus without complications: Secondary | ICD-10-CM | POA: Diagnosis not present

## 2017-03-13 DIAGNOSIS — Z9641 Presence of insulin pump (external) (internal): Secondary | ICD-10-CM | POA: Diagnosis not present

## 2017-03-19 ENCOUNTER — Encounter (HOSPITAL_COMMUNITY): Payer: Self-pay | Admitting: Obstetrics & Gynecology

## 2017-03-20 ENCOUNTER — Other Ambulatory Visit: Payer: Self-pay | Admitting: *Deleted

## 2017-03-20 NOTE — Patient Outreach (Signed)
Received referral from [employer] on 03/20/17 on this [facility] medical plan member for ongoing Type I diabetes self management assistance during her pregnancy. Left message on her mobile number requesting return call to this RNCM to establish her care with [facility]. Bary RichardJanet S. Hauser RN,CCM,CDE [facility] Care Management Coordinator Office Phone [phone] Office Fax [phone]

## 2017-03-23 ENCOUNTER — Other Ambulatory Visit: Payer: Self-pay | Admitting: *Deleted

## 2017-03-23 NOTE — Patient Outreach (Signed)
Karen Alexander returned call and initial phone screening completed. Received referral on this Karen Alexander plan member from [employer] to [name] with Type [facility] diabetes management during her pregnancy. Karen Alexander was previously in the [employer] [employer] disease manage,tn program from 2012 until 02/17/17 when she transitioned to the program with [employer] and is also in the Healthy Pregnancy/Babyscripts. Karen Alexander  wears a [employer] Revel 723 insulin pump, Enlite continuous glucose monitor (CGM), and uses  a One Touch Ultra Link glucometer. She does not like the continuous glucose monitor is not currently wearing it.  Karen Alexander states she now feels fine after considerable morning sickness during her first trimester. 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 She says her most recent Hgb A1C  was 5.9 %.  She said she was experiencing fasting low blood sugars so her endocrinologist made some adjustments to her insulin pump settings and she is no longer experiencing  lows. She states she sees her OB MD monthly and her due date is June 23rd. This is her third child;  she has two other boys born 09/05/15 via C section and 05/27/13 via vaginal delivery. She says she will find out the gender of this baby at her OB office visit on Friday, 2/8.  Advised Karen Alexander this RNCM will contact her regarding ongoing Type I self management assistance and then will refer her back to [employer] after her postpartum assessment if completed.   Bary RichardJanet S. Hauser RN,CCM,CDE [facility] Care Management Coordinator Office Phone [phone] Office Fax [phone]

## 2017-03-26 DIAGNOSIS — E109 Type 1 diabetes mellitus without complications: Secondary | ICD-10-CM | POA: Diagnosis not present

## 2017-03-27 ENCOUNTER — Other Ambulatory Visit: Payer: Self-pay | Admitting: Obstetrics & Gynecology

## 2017-03-27 ENCOUNTER — Ambulatory Visit (HOSPITAL_COMMUNITY)
Admission: RE | Admit: 2017-03-27 | Discharge: 2017-03-27 | Disposition: A | Discharge status: Home / Self Care | Payer: 59 | Source: Ambulatory Visit | Attending: Obstetrics & Gynecology | Admitting: Obstetrics & Gynecology

## 2017-03-27 ENCOUNTER — Encounter (HOSPITAL_COMMUNITY): Payer: Self-pay

## 2017-03-27 DIAGNOSIS — E109 Type 1 diabetes mellitus without complications: Secondary | ICD-10-CM

## 2017-03-27 DIAGNOSIS — O09212 Supervision of pregnancy with history of pre-term labor, second trimester: Secondary | ICD-10-CM | POA: Diagnosis not present

## 2017-03-27 DIAGNOSIS — IMO0001 Reserved for inherently not codable concepts without codable children: Secondary | ICD-10-CM

## 2017-03-27 DIAGNOSIS — O24319 Unspecified pre-existing diabetes mellitus in pregnancy, unspecified trimester: Secondary | ICD-10-CM | POA: Diagnosis present

## 2017-03-27 DIAGNOSIS — Z794 Long term (current) use of insulin: Secondary | ICD-10-CM

## 2017-03-27 DIAGNOSIS — O09812 Supervision of pregnancy resulting from assisted reproductive technology, second trimester: Secondary | ICD-10-CM | POA: Insufficient documentation

## 2017-03-27 DIAGNOSIS — O09292 Supervision of pregnancy with other poor reproductive or obstetric history, second trimester: Secondary | ICD-10-CM | POA: Insufficient documentation

## 2017-03-27 DIAGNOSIS — O24012 Pre-existing diabetes mellitus, type 1, in pregnancy, second trimester: Secondary | ICD-10-CM | POA: Insufficient documentation

## 2017-03-27 DIAGNOSIS — Z363 Encounter for antenatal screening for malformations: Secondary | ICD-10-CM

## 2017-03-27 DIAGNOSIS — Z3689 Encounter for other specified antenatal screening: Secondary | ICD-10-CM

## 2017-03-27 DIAGNOSIS — Z3A19 19 weeks gestation of pregnancy: Secondary | ICD-10-CM | POA: Insufficient documentation

## 2017-03-27 DIAGNOSIS — O34219 Maternal care for unspecified type scar from previous cesarean delivery: Secondary | ICD-10-CM | POA: Diagnosis not present

## 2017-04-07 ENCOUNTER — Ambulatory Visit (INDEPENDENT_AMBULATORY_CARE_PROVIDER_SITE_OTHER): Payer: 59 | Admitting: Obstetrics and Gynecology

## 2017-04-07 ENCOUNTER — Encounter: Payer: Self-pay | Admitting: Obstetrics and Gynecology

## 2017-04-07 VITALS — BP 92/53 | HR 72 | Wt 145.0 lb

## 2017-04-07 DIAGNOSIS — O26899 Other specified pregnancy related conditions, unspecified trimester: Secondary | ICD-10-CM

## 2017-04-07 DIAGNOSIS — O09892 Supervision of other high risk pregnancies, second trimester: Secondary | ICD-10-CM

## 2017-04-07 DIAGNOSIS — E109 Type 1 diabetes mellitus without complications: Secondary | ICD-10-CM

## 2017-04-07 DIAGNOSIS — O09212 Supervision of pregnancy with history of pre-term labor, second trimester: Secondary | ICD-10-CM

## 2017-04-07 DIAGNOSIS — O24319 Unspecified pre-existing diabetes mellitus in pregnancy, unspecified trimester: Secondary | ICD-10-CM

## 2017-04-07 DIAGNOSIS — Z8759 Personal history of other complications of pregnancy, childbirth and the puerperium: Secondary | ICD-10-CM

## 2017-04-07 DIAGNOSIS — O09899 Supervision of other high risk pregnancies, unspecified trimester: Secondary | ICD-10-CM

## 2017-04-07 DIAGNOSIS — O34219 Maternal care for unspecified type scar from previous cesarean delivery: Secondary | ICD-10-CM

## 2017-04-07 DIAGNOSIS — O24312 Unspecified pre-existing diabetes mellitus in pregnancy, second trimester: Secondary | ICD-10-CM

## 2017-04-07 DIAGNOSIS — Z6791 Unspecified blood type, Rh negative: Secondary | ICD-10-CM

## 2017-04-07 DIAGNOSIS — O09219 Supervision of pregnancy with history of pre-term labor, unspecified trimester: Secondary | ICD-10-CM

## 2017-04-07 DIAGNOSIS — O09292 Supervision of pregnancy with other poor reproductive or obstetric history, second trimester: Secondary | ICD-10-CM

## 2017-04-07 DIAGNOSIS — O09299 Supervision of pregnancy with other poor reproductive or obstetric history, unspecified trimester: Secondary | ICD-10-CM

## 2017-04-07 DIAGNOSIS — O0990 Supervision of high risk pregnancy, unspecified, unspecified trimester: Secondary | ICD-10-CM

## 2017-04-07 DIAGNOSIS — Z319 Encounter for procreative management, unspecified: Secondary | ICD-10-CM | POA: Diagnosis not present

## 2017-04-07 DIAGNOSIS — O09812 Supervision of pregnancy resulting from assisted reproductive technology, second trimester: Secondary | ICD-10-CM | POA: Insufficient documentation

## 2017-04-07 DIAGNOSIS — Z794 Long term (current) use of insulin: Secondary | ICD-10-CM

## 2017-04-07 DIAGNOSIS — O0992 Supervision of high risk pregnancy, unspecified, second trimester: Secondary | ICD-10-CM

## 2017-04-07 DIAGNOSIS — IMO0001 Reserved for inherently not codable concepts without codable children: Secondary | ICD-10-CM

## 2017-04-07 LAB — POCT URINE QUALITATIVE DIPSTICK BLOOD: Blood, UA: NEGATIVE

## 2017-04-07 NOTE — Addendum Note (Signed)
Addended by: Cheree DittoGRAHAM, DEMETRICE A on: 04/07/2017 09:42 AM   Modules accepted: Orders

## 2017-04-07 NOTE — Progress Notes (Signed)
Prenatal Visit Note Date: 04/07/2017 Clinic: [facility]  Subjective:  Karen Alexander is a 32 y.o. [id number] at 1816w2d being seen today for ongoing prenatal care.  She is currently monitored for the following issues for this high-risk pregnancy and has Type 1 diabetes mellitus (HCC); Supervision of high risk pregnancy, antepartum; Pre-existing insulin-dependent diabetes mellitus during pregnancy, antepartum (HCC); Previous cesarean section complicating pregnancy, antepartum condition or complication; Prior fetal macrosomia, antepartum; Rh negative state in antepartum period; and History of preterm delivery at 36 weeks, currently pregnant on their problem list.  Patient reports no complaints.   Contractions: Not present.  .  Movement: Present. Denies leaking of fluid.   The following portions of the patient's history were reviewed and updated as appropriate: allergies, current medications, past family history, past medical history, past social history, past surgical history and problem list. Problem list updated.  Objective:   Vitals:   04/07/17 0840  BP: (!) 92/53  Pulse: 72  Weight: 145 lb (65.8 kg)    Fetal Status: Fetal Heart Rate (bpm): 153   Movement: Present     General:  Alert, oriented and cooperative. Patient is in no acute distress.  Skin: Skin is warm and dry. No rash noted.   Cardiovascular: Normal heart rate noted  Respiratory: Normal respiratory effort, no problems with respiration noted  Abdomen: Soft, gravid, appropriate for gestational age. Pain/Pressure: Absent     Pelvic:  Cervical exam deferred        Extremities: Normal range of motion.  Edema: None  Mental Status: Normal mood and affect. Normal behavior. Normal judgment and thought content.   Urinalysis:      Assessment and Plan:  Pregnancy: [id number] at 1916w2d  1. Supervision of high risk pregnancy, antepartum Routine care. D/w pt re: btl at 28-30wks - US MFM OB FOLLOW UP; Future  2.  Previous cesarean section complicating pregnancy, antepartum condition or complication H/o 35wk pprom, SD and partial 3rd degree with 4120gm child. 2nd delivery pLTCS for 6145gm child @ 38wks. F/u subsequent growth u/s and d/w pt re: delivery later in pregnancy.   3. Rh negative state in antepartum period Rhogam at 28wks  4. Type 1 diabetes mellitus without complication (HCC) Patient on insulin pump. [facility] reviewed. A1c down. Normal anatomy scan last week and screening fetal echo for early march already set up. Will schedule qmonth serial growth u/s. Start ap test at 32wks - US MFM OB FOLLOW UP; Future  5. Pre-existing insulin-dependent diabetes mellitus during pregnancy, antepartum (HCC) See above.   6. H/o 35wk PPROM and PTB with G1 pregnancy Declined 17p this pregnancy. It doesn't appear that she was on it with her last child, which went to term.    Preterm labor symptoms and general obstetric precautions including but not limited to vaginal bleeding, contractions, leaking of fluid and fetal movement were reviewed in detail with the patient. Please refer to After Visit Summary for other counseling recommendations.  Return in about 3 weeks (around 04/28/2017) for rob.   [place] BingPickens, Jaymari Cromie, MD

## 2017-04-16 ENCOUNTER — Encounter: Payer: Self-pay | Admitting: *Deleted

## 2017-04-16 NOTE — Patient Outreach (Signed)
[facility] Care Management   04/16/2017  Karen Alexander Aug 06, 1985 [id number]  Karen Alexander is an 32 y.o. female in her second trimester of pregnancy with preexisting type I diabetes   Subjective: Karen Alexander called this RNCM to enroll in the diabetes with pregnancy program.  She states her due date is 08/09/17. She wears an inulin pump and recently ( 03/13/17)  saw her endocrinologist due to frequent low fasting blood sugars. She says since the pump setting have changed, the low blood sugars have resolved. She says her Hgb A1c was 5.9% on 03/13/17.    Objective: Telephone assessment completed.    ROS- not done  Physical Exam- not done  Encounter Medications:   Outpatient Encounter Medications as of 03/23/2017  Medication Sig Note  . aspirin EC 81 MG tablet Take 81 mg by mouth.   . B-D ULTRA-FINE 33 LANCETS MISC  08/17/2015: [facility]  . DOCOSAHEXAENOIC ACID PO Take by mouth.   . Doxylamine-Pyridoxine ER (BONJESTA) 20-20 MG TBCR Take 1 tablet by mouth at bedtime. May add 1 tab midday if Sx persist   . glucose blood (BAYER CONTOUR NEXT TEST) test strip Reported on 07/31/2015 02/23/2015: [facility]  . insulin lispro (HUMALOG) 100 UNIT/ML injection USE AS DIRECTED IN INSULIN PUMP 60 UNITS PER DAY 08/22/2015: Basal rate: 1200   1.0 units/ hour 0800    1.0 units/ hour 2100    1.25 units/hour   . loratadine (CLARITIN) 10 MG tablet Take 10 mg by mouth daily.   . Prenatal Vit-Fe Fumarate-FA (MULTIVITAMIN-PRENATAL) 27-0.8 MG TABS tablet Take 1 tablet by mouth daily at 12 noon.   . [DISCONTINUED] levothyroxine (SYNTHROID, LEVOTHROID) 50 MCG tablet Take 1 tablet (50 mcg total) by mouth daily before breakfast. (Patient not taking: Reported on 03/27/2017)    No facility-administered encounter medications on file as of 03/23/2017.     Functional Status:   In your present state of health, do you have any difficulty  performing the following activities: 03/23/2017  Hearing? N  Vision? N  Difficulty concentrating or making decisions? N  Walking or climbing stairs? N  Dressing or bathing? N  Some encounter information is confidential and restricted. Go to Review Flowsheets activity to see all data.  Some recent data might be hidden    Fall/Depression Screening:    Fall Risk  08/07/2015 07/20/2015 07/06/2015  Falls in the past year? No No No  Some encounter information is confidential and restricted. Go to Review Flowsheets activity to see all data.   PHQ 2/9 Scores 06/08/2015 04/27/2015 09/08/2014  PHQ - 2 Score 0 0 0  Some encounter information is confidential and restricted. Go to Review Flowsheets activity to see all data.    Assessment:  Enrolled Karen Alexander in the pregnancy with diabetes program.   Plan:  [facility] CM Care Plan Problem One     Most Recent Value  Care Plan Problem One  Pre-existing Type I diabetes in pregnancy  Role Documenting the Problem One  Care Management Coordinator  Care Plan for Problem One  Active  [facility] Long Term Goal   Patient will demonstrate good control of Type I diabetes as evidenced by blood sugars meeting target 90% of time, Hgb A1C <6.5%, delivery of baby without maternal or fetal complications related  to maternal diabetes  [facility] Long Term Goal Start Date  03/23/17  Interventions for Problem One Long Term Goal  reviewed diabetes in pregnancy program, requested  Karen Jester update this RNCM after each provider appointment,       Will follow up with Karen Jester at least once monthly via phone or secure e-mail to assess ongoing management of Type I DM during pregnancy.   Bary Richard RN,CCM,CDE [facility] Care Management Coordinator Office Phone [phone] Office Fax [phone]

## 2017-04-17 ENCOUNTER — Encounter: Payer: Self-pay | Admitting: Radiology

## 2017-04-17 DIAGNOSIS — O24012 Pre-existing diabetes mellitus, type 1, in pregnancy, second trimester: Secondary | ICD-10-CM | POA: Diagnosis not present

## 2017-04-21 DIAGNOSIS — E109 Type 1 diabetes mellitus without complications: Secondary | ICD-10-CM | POA: Diagnosis not present

## 2017-04-21 DIAGNOSIS — O24012 Pre-existing diabetes mellitus, type 1, in pregnancy, second trimester: Secondary | ICD-10-CM | POA: Diagnosis not present

## 2017-04-21 DIAGNOSIS — Z9641 Presence of insulin pump (external) (internal): Secondary | ICD-10-CM | POA: Diagnosis not present

## 2017-05-05 ENCOUNTER — Encounter: Payer: Self-pay | Admitting: Family Medicine

## 2017-05-05 ENCOUNTER — Other Ambulatory Visit (HOSPITAL_COMMUNITY): Payer: Self-pay | Admitting: *Deleted

## 2017-05-05 ENCOUNTER — Ambulatory Visit (INDEPENDENT_AMBULATORY_CARE_PROVIDER_SITE_OTHER): Payer: 59 | Admitting: Family Medicine

## 2017-05-05 ENCOUNTER — Other Ambulatory Visit: Payer: Self-pay | Admitting: Obstetrics and Gynecology

## 2017-05-05 ENCOUNTER — Ambulatory Visit (HOSPITAL_COMMUNITY)
Admission: RE | Admit: 2017-05-05 | Discharge: 2017-05-05 | Disposition: A | Discharge status: Home / Self Care | Payer: 59 | Source: Ambulatory Visit | Attending: Obstetrics and Gynecology | Admitting: Obstetrics and Gynecology

## 2017-05-05 ENCOUNTER — Encounter (HOSPITAL_COMMUNITY): Payer: Self-pay

## 2017-05-05 VITALS — BP 120/76 | HR 76 | Wt 166.0 lb

## 2017-05-05 DIAGNOSIS — O34219 Maternal care for unspecified type scar from previous cesarean delivery: Secondary | ICD-10-CM

## 2017-05-05 DIAGNOSIS — O09219 Supervision of pregnancy with history of pre-term labor, unspecified trimester: Secondary | ICD-10-CM

## 2017-05-05 DIAGNOSIS — O0990 Supervision of high risk pregnancy, unspecified, unspecified trimester: Secondary | ICD-10-CM

## 2017-05-05 DIAGNOSIS — O09292 Supervision of pregnancy with other poor reproductive or obstetric history, second trimester: Secondary | ICD-10-CM

## 2017-05-05 DIAGNOSIS — Z3A25 25 weeks gestation of pregnancy: Secondary | ICD-10-CM

## 2017-05-05 DIAGNOSIS — E109 Type 1 diabetes mellitus without complications: Secondary | ICD-10-CM

## 2017-05-05 DIAGNOSIS — O09299 Supervision of pregnancy with other poor reproductive or obstetric history, unspecified trimester: Secondary | ICD-10-CM

## 2017-05-05 DIAGNOSIS — O24919 Unspecified diabetes mellitus in pregnancy, unspecified trimester: Secondary | ICD-10-CM

## 2017-05-05 DIAGNOSIS — O09812 Supervision of pregnancy resulting from assisted reproductive technology, second trimester: Secondary | ICD-10-CM | POA: Diagnosis not present

## 2017-05-05 DIAGNOSIS — O26893 Other specified pregnancy related conditions, third trimester: Secondary | ICD-10-CM | POA: Diagnosis not present

## 2017-05-05 DIAGNOSIS — O09212 Supervision of pregnancy with history of pre-term labor, second trimester: Secondary | ICD-10-CM | POA: Insufficient documentation

## 2017-05-05 DIAGNOSIS — O0992 Supervision of high risk pregnancy, unspecified, second trimester: Secondary | ICD-10-CM

## 2017-05-05 DIAGNOSIS — O24012 Pre-existing diabetes mellitus, type 1, in pregnancy, second trimester: Secondary | ICD-10-CM | POA: Diagnosis not present

## 2017-05-05 DIAGNOSIS — O09899 Supervision of other high risk pregnancies, unspecified trimester: Secondary | ICD-10-CM

## 2017-05-05 NOTE — Progress Notes (Signed)
Endocrinologist adjusted insulin and pt is to follow up in 2 weeks  Last office visit has incorrect weight logged - Her weight on 02/19 was 159lb

## 2017-05-05 NOTE — Patient Instructions (Signed)
 Second Trimester of Pregnancy The second trimester is from week 14 through week 27 (months 4 through 6). The second trimester is often a time when you feel your best. Your body has adjusted to being pregnant, and you begin to feel better physically. Usually, morning sickness has lessened or quit completely, you may have more energy, and you may have an increase in appetite. The second trimester is also a time when the fetus is growing rapidly. At the end of the sixth month, the fetus is about 9 inches long and weighs about 1 pounds. You will likely begin to feel the baby move (quickening) between 16 and 20 weeks of pregnancy. Body changes during your second trimester Your body continues to go through many changes during your second trimester. The changes vary from woman to woman.  Your weight will continue to increase. You will notice your lower abdomen bulging out.  You may begin to get stretch marks on your hips, abdomen, and breasts.  You may develop headaches that can be relieved by medicines. The medicines should be approved by your health care provider.  You may urinate more often because the fetus is pressing on your bladder.  You may develop or continue to have heartburn as a result of your pregnancy.  You may develop constipation because certain hormones are causing the muscles that push waste through your intestines to slow down.  You may develop hemorrhoids or swollen, bulging veins (varicose veins).  You may have back pain. This is caused by: ? Weight gain. ? Pregnancy hormones that are relaxing the joints in your pelvis. ? A shift in weight and the muscles that support your balance.  Your breasts will continue to grow and they will continue to become tender.  Your gums may bleed and may be sensitive to brushing and flossing.  Dark spots or blotches (chloasma, mask of pregnancy) may develop on your face. This will likely fade after the baby is born.  A dark line from  your belly button to the pubic area (linea nigra) may appear. This will likely fade after the baby is born.  You may have changes in your hair. These can include thickening of your hair, rapid growth, and changes in texture. Some women also have hair loss during or after pregnancy, or hair that feels dry or thin. Your hair will most likely return to normal after your baby is born.  What to expect at prenatal visits During a routine prenatal visit:  You will be weighed to make sure you and the fetus are growing normally.  Your blood pressure will be taken.  Your abdomen will be measured to track your baby's growth.  The fetal heartbeat will be listened to.  Any test results from the previous visit will be discussed.  Your health care provider may ask you:  How you are feeling.  If you are feeling the baby move.  If you have had any abnormal symptoms, such as leaking fluid, bleeding, severe headaches, or abdominal cramping.  If you are using any tobacco products, including cigarettes, chewing tobacco, and electronic cigarettes.  If you have any questions.  Other tests that may be performed during your second trimester include:  Blood tests that check for: ? Low iron levels (anemia). ? High blood sugar that affects pregnant women (gestational diabetes) between 24 and 28 weeks. ? Rh antibodies. This is to check for a protein on red blood cells (Rh factor).  Urine tests to check for infections, diabetes,   or protein in the urine.  An ultrasound to confirm the proper growth and development of the baby.  An amniocentesis to check for possible genetic problems.  Fetal screens for spina bifida and Down syndrome.  HIV (human immunodeficiency virus) testing. Routine prenatal testing includes screening for HIV, unless you choose not to have this test.  Follow these instructions at home: Medicines  Follow your health care provider's instructions regarding medicine use. Specific  medicines may be either safe or unsafe to take during pregnancy.  Take a prenatal vitamin that contains at least 600 micrograms (mcg) of folic acid.  If you develop constipation, try taking a stool softener if your health care provider approves. Eating and drinking  Eat a balanced diet that includes fresh fruits and vegetables, whole grains, good sources of protein such as meat, eggs, or tofu, and low-fat dairy. Your health care provider will help you determine the amount of weight gain that is right for you.  Avoid raw meat and uncooked cheese. These carry germs that can cause birth defects in the baby.  If you have low calcium intake from food, talk to your health care provider about whether you should take a daily calcium supplement.  Limit foods that are high in fat and processed sugars, such as fried and sweet foods.  To prevent constipation: ? Drink enough fluid to keep your urine clear or pale yellow. ? Eat foods that are high in fiber, such as fresh fruits and vegetables, whole grains, and beans. Activity  Exercise only as directed by your health care provider. Most women can continue their usual exercise routine during pregnancy. Try to exercise for 30 minutes at least 5 days a week. Stop exercising if you experience uterine contractions.  Avoid heavy lifting, wear low heel shoes, and practice good posture.  A sexual relationship may be continued unless your health care provider directs you otherwise. Relieving pain and discomfort  Wear a good support bra to prevent discomfort from breast tenderness.  Take warm sitz baths to soothe any pain or discomfort caused by hemorrhoids. Use hemorrhoid cream if your health care provider approves.  Rest with your legs elevated if you have leg cramps or low back pain.  If you develop varicose veins, wear support hose. Elevate your feet for 15 minutes, 3-4 times a day. Limit salt in your diet. Prenatal Care  Write down your questions.  Take them to your prenatal visits.  Keep all your prenatal visits as told by your health care provider. This is important. Safety  Wear your seat belt at all times when driving.  Make a list of emergency phone numbers, including numbers for family, friends, the hospital, and police and fire departments. General instructions  Ask your health care provider for a referral to a local prenatal education class. Begin classes no later than the beginning of month 6 of your pregnancy.  Ask for help if you have counseling or nutritional needs during pregnancy. Your health care provider can offer advice or refer you to specialists for help with various needs.  Do not use hot tubs, steam rooms, or saunas.  Do not douche or use tampons or scented sanitary pads.  Do not cross your legs for long periods of time.  Avoid cat litter boxes and soil used by cats. These carry germs that can cause birth defects in the baby and possibly loss of the fetus by miscarriage or stillbirth.  Avoid all smoking, herbs, alcohol, and unprescribed drugs. Chemicals in these products   can affect the formation and growth of the baby.  Do not use any products that contain nicotine or tobacco, such as cigarettes and e-cigarettes. If you need help quitting, ask your health care provider.  Visit your dentist if you have not gone yet during your pregnancy. Use a soft toothbrush to brush your teeth and be gentle when you floss. Contact a health care provider if:  You have dizziness.  You have mild pelvic cramps, pelvic pressure, or nagging pain in the abdominal area.  You have persistent nausea, vomiting, or diarrhea.  You have a bad smelling vaginal discharge.  You have pain when you urinate. Get help right away if:  You have a fever.  You are leaking fluid from your vagina.  You have spotting or bleeding from your vagina.  You have severe abdominal cramping or pain.  You have rapid weight gain or weight  loss.  You have shortness of breath with chest pain.  You notice sudden or extreme swelling of your face, hands, ankles, feet, or legs.  You have not felt your baby move in over an hour.  You have severe headaches that do not go away when you take medicine.  You have vision changes. Summary  The second trimester is from week 14 through week 27 (months 4 through 6). It is also a time when the fetus is growing rapidly.  Your body goes through many changes during pregnancy. The changes vary from woman to woman.  Avoid all smoking, herbs, alcohol, and unprescribed drugs. These chemicals affect the formation and growth your baby.  Do not use any tobacco products, such as cigarettes, chewing tobacco, and e-cigarettes. If you need help quitting, ask your health care provider.  Contact your health care provider if you have any questions. Keep all prenatal visits as told by your health care provider. This is important. This information is not intended to replace advice given to you by your health care provider. Make sure you discuss any questions you have with your health care provider. Document Released: 01/28/2001 Document Revised: 03/11/2016 Document Reviewed: 03/11/2016 [employer] Interactive Patient Education  2018 [employer]   Breastfeeding Choosing to breastfeed is one of the best decisions you can make for yourself and your baby. A change in hormones during pregnancy causes your breasts to make breast milk in your milk-producing glands. Hormones prevent breast milk from being released before your baby is born. They also prompt milk flow after birth. Once breastfeeding has begun, thoughts of your baby, as well as his or her sucking or crying, can stimulate the release of milk from your milk-producing glands. Benefits of breastfeeding Research shows that breastfeeding offers many health benefits for infants and mothers. It also offers a cost-free and convenient way to feed your  baby. For your baby  Your first milk (colostrum) helps your baby's digestive system to function better.  Special cells in your milk (antibodies) help your baby to fight off infections.  Breastfed babies are less likely to develop asthma, allergies, obesity, or type 2 diabetes. They are also at lower risk for sudden infant death syndrome (SIDS).  Nutrients in breast milk are better able to meet your baby's needs compared to infant formula.  Breast milk improves your baby's brain development. For you  Breastfeeding helps to create a very special bond between you and your baby.  Breastfeeding is convenient. Breast milk costs nothing and is always available at the correct temperature.  Breastfeeding helps to burn calories. It helps you   to lose the weight that you gained during pregnancy.  Breastfeeding makes your uterus return faster to its size before pregnancy. It also slows bleeding (lochia) after you give birth.  Breastfeeding helps to lower your risk of developing type 2 diabetes, osteoporosis, rheumatoid arthritis, cardiovascular disease, and breast, ovarian, uterine, and endometrial cancer later in life. Breastfeeding basics Starting breastfeeding  Find a comfortable place to sit or lie down, with your neck and back well-supported.  Place a pillow or a rolled-up blanket under your baby to bring him or her to the level of your breast (if you are seated). Nursing pillows are specially designed to help support your arms and your baby while you breastfeed.  Make sure that your baby's tummy (abdomen) is facing your abdomen.  Gently massage your breast. With your fingertips, massage from the outer edges of your breast inward toward the nipple. This encourages milk flow. If your milk flows slowly, you may need to continue this action during the feeding.  Support your breast with 4 fingers underneath and your thumb above your nipple (make the letter "C" with your hand). Make sure your  fingers are well away from your nipple and your baby's mouth.  Stroke your baby's lips gently with your finger or nipple.  When your baby's mouth is open wide enough, quickly bring your baby to your breast, placing your entire nipple and as much of the areola as possible into your baby's mouth. The areola is the colored area around your nipple. ? More areola should be visible above your baby's upper lip than below the lower lip. ? Your baby's lips should be opened and extended outward (flanged) to ensure an adequate, comfortable latch. ? Your baby's tongue should be between his or her lower gum and your breast.  Make sure that your baby's mouth is correctly positioned around your nipple (latched). Your baby's lips should create a seal on your breast and be turned out (everted).  It is common for your baby to suck about 2-3 minutes in order to start the flow of breast milk. Latching Teaching your baby how to latch onto your breast properly is very important. An improper latch can cause nipple pain, decreased milk supply, and poor weight gain in your baby. Also, if your baby is not latched onto your nipple properly, he or she may swallow some air during feeding. This can make your baby fussy. Burping your baby when you switch breasts during the feeding can help to get rid of the air. However, teaching your baby to latch on properly is still the best way to prevent fussiness from swallowing air while breastfeeding. Signs that your baby has successfully latched onto your nipple  Silent tugging or silent sucking, without causing you pain. Infant's lips should be extended outward (flanged).  Swallowing heard between every 3-4 sucks once your milk has started to flow (after your let-down milk reflex occurs).  Muscle movement above and in front of his or her ears while sucking.  Signs that your baby has not successfully latched onto your nipple  Sucking sounds or smacking sounds from your baby while  breastfeeding.  Nipple pain.  If you think your baby has not latched on correctly, slip your finger into the corner of your baby's mouth to break the suction and place it between your baby's gums. Attempt to start breastfeeding again. Signs of successful breastfeeding Signs from your baby  Your baby will gradually decrease the number of sucks or will completely stop sucking.    Your baby will fall asleep.  Your baby's body will relax.  Your baby will retain a small amount of milk in his or her mouth.  Your baby will let go of your breast by himself or herself.  Signs from you  Breasts that have increased in firmness, weight, and size 1-3 hours after feeding.  Breasts that are softer immediately after breastfeeding.  Increased milk volume, as well as a change in milk consistency and color by the fifth day of breastfeeding.  Nipples that are not sore, cracked, or bleeding.  Signs that your baby is getting enough milk  Wetting at least 1-2 diapers during the first 24 hours after birth.  Wetting at least 5-6 diapers every 24 hours for the first week after birth. The urine should be clear or pale yellow by the age of 5 days.  Wetting 6-8 diapers every 24 hours as your baby continues to grow and develop.  At least 3 stools in a 24-hour period by the age of 5 days. The stool should be soft and yellow.  At least 3 stools in a 24-hour period by the age of 7 days. The stool should be seedy and yellow.  No loss of weight greater than 10% of birth weight during the first 3 days of life.  Average weight gain of 4-7 oz (113-198 g) per week after the age of 4 days.  Consistent daily weight gain by the age of 5 days, without weight loss after the age of 2 weeks. After a feeding, your baby may spit up a small amount of milk. This is normal. Breastfeeding frequency and duration Frequent feeding will help you make more milk and can prevent sore nipples and extremely full breasts (breast  engorgement). Breastfeed when you feel the need to reduce the fullness of your breasts or when your baby shows signs of hunger. This is called "breastfeeding on demand." Signs that your baby is hungry include:  Increased alertness, activity, or restlessness.  Movement of the head from side to side.  Opening of the mouth when the corner of the mouth or cheek is stroked (rooting).  Increased sucking sounds, smacking lips, cooing, sighing, or squeaking.  Hand-to-mouth movements and sucking on fingers or hands.  Fussing or crying.  Avoid introducing a pacifier to your baby in the first 4-6 weeks after your baby is born. After this time, you may choose to use a pacifier. Research has shown that pacifier use during the first year of a baby's life decreases the risk of sudden infant death syndrome (SIDS). Allow your baby to feed on each breast as long as he or she wants. When your baby unlatches or falls asleep while feeding from the first breast, offer the second breast. Because newborns are often sleepy in the first few weeks of life, you may need to awaken your baby to get him or her to feed. Breastfeeding times will vary from baby to baby. However, the following rules can serve as a guide to help you make sure that your baby is properly fed:  Newborns (babies 4 weeks of age or younger) may breastfeed every 1-3 hours.  Newborns should not go without breastfeeding for longer than 3 hours during the day or 5 hours during the night.  You should breastfeed your baby a minimum of 8 times in a 24-hour period.  Breast milk pumping Pumping and storing breast milk allows you to make sure that your baby is exclusively fed your breast milk, even at times   when you are unable to breastfeed. This is especially important if you go back to work while you are still breastfeeding, or if you are not able to be present during feedings. Your lactation consultant can help you find a method of pumping that works best  for you and give you guidelines about how long it is safe to store breast milk. Caring for your breasts while you breastfeed Nipples can become dry, cracked, and sore while breastfeeding. The following recommendations can help keep your breasts moisturized and healthy:  Avoid using soap on your nipples.  Wear a supportive bra designed especially for nursing. Avoid wearing underwire-style bras or extremely tight bras (sports bras).  Air-dry your nipples for 3-4 minutes after each feeding.  Use only cotton bra pads to absorb leaked breast milk. Leaking of breast milk between feedings is normal.  Use lanolin on your nipples after breastfeeding. Lanolin helps to maintain your skin's normal moisture barrier. Pure lanolin is not harmful (not toxic) to your baby. You may also hand express a few drops of breast milk and gently massage that milk into your nipples and allow the milk to air-dry.  In the first few weeks after giving birth, some women experience breast engorgement. Engorgement can make your breasts feel heavy, warm, and tender to the touch. Engorgement peaks within 3-5 days after you give birth. The following recommendations can help to ease engorgement:  Completely empty your breasts while breastfeeding or pumping. You may want to start by applying warm, moist heat (in the shower or with warm, water-soaked hand towels) just before feeding or pumping. This increases circulation and helps the milk flow. If your baby does not completely empty your breasts while breastfeeding, pump any extra milk after he or she is finished.  Apply ice packs to your breasts immediately after breastfeeding or pumping, unless this is too uncomfortable for you. To do this: ? Put ice in a plastic bag. ? Place a towel between your skin and the bag. ? Leave the ice on for 20 minutes, 2-3 times a day.  Make sure that your baby is latched on and positioned properly while breastfeeding.  If engorgement persists  after 48 hours of following these recommendations, contact your health care provider or a lactation consultant. Overall health care recommendations while breastfeeding  Eat 3 healthy meals and 3 snacks every day. Well-nourished mothers who are breastfeeding need an additional 450-500 calories a day. You can meet this requirement by increasing the amount of a balanced diet that you eat.  Drink enough water to keep your urine pale yellow or clear.  Rest often, relax, and continue to take your prenatal vitamins to prevent fatigue, stress, and low vitamin and mineral levels in your body (nutrient deficiencies).  Do not use any products that contain nicotine or tobacco, such as cigarettes and e-cigarettes. Your baby may be harmed by chemicals from cigarettes that pass into breast milk and exposure to secondhand smoke. If you need help quitting, ask your health care provider.  Avoid alcohol.  Do not use illegal drugs or marijuana.  Talk with your health care provider before taking any medicines. These include over-the-counter and prescription medicines as well as vitamins and herbal supplements. Some medicines that may be harmful to your baby can pass through breast milk.  It is possible to become pregnant while breastfeeding. If birth control is desired, ask your health care provider about options that will be safe while breastfeeding your baby. Where to find more information: [employer]: www.llli.org Contact a health care provider if:  You feel like you want to stop breastfeeding or have become frustrated with breastfeeding.  Your nipples are cracked or bleeding.  Your breasts are red, tender, or warm.  You have: ? Painful breasts or nipples. ? A swollen area on either breast. ? A fever or chills. ? Nausea or vomiting. ? Drainage other than breast milk from your nipples.  Your breasts do not become full before feedings by the fifth day after you give birth.  You  feel sad and depressed.  Your baby is: ? Too sleepy to eat well. ? Having trouble sleeping. ? More than 1 week old and wetting fewer than 6 diapers in a 24-hour period. ? Not gaining weight by 5 days of age.  Your baby has fewer than 3 stools in a 24-hour period.  Your baby's skin or the white parts of his or her eyes become yellow. Get help right away if:  Your baby is overly tired (lethargic) and does not want to wake up and feed.  Your baby develops an unexplained fever. Summary  Breastfeeding offers many health benefits for infant and mothers.  Try to breastfeed your infant when he or she shows early signs of hunger.  Gently tickle or stroke your baby's lips with your finger or nipple to allow the baby to open his or her mouth. Bring the baby to your breast. Make sure that much of the areola is in your baby's mouth. Offer one side and burp the baby before you offer the other side.  Talk with your health care provider or lactation consultant if you have questions or you face problems as you breastfeed. This information is not intended to replace advice given to you by your health care provider. Make sure you discuss any questions you have with your health care provider. Document Released: 02/03/2005 Document Revised: 03/07/2016 Document Reviewed: 03/07/2016 [employer] Interactive Patient Education  2018 [employer]  

## 2017-05-05 NOTE — Progress Notes (Signed)
   PRENATAL VISIT NOTE  Subjective:  Karen Alexander is a 32 y.o. [id number] at 3045w2d being seen today for ongoing prenatal care.  She is currently monitored for the following issues for this high-risk pregnancy and has Type 1 diabetes mellitus (HCC); Supervision of high risk pregnancy, antepartum; Pre-existing insulin-dependent diabetes mellitus during pregnancy, antepartum (HCC); Previous cesarean section complicating pregnancy, antepartum condition or complication; Prior fetal macrosomia, antepartum; Rh negative state in antepartum period; History of preterm delivery at 36 weeks, currently pregnant; History of shoulder dystocia in prior pregnancy, currently pregnant; and History of third degree perineal laceration on their problem list.  Patient reports no complaints.  Contractions: Not present. Vag. Bleeding: None.  Movement: Present. Denies leaking of fluid.   The following portions of the patient's history were reviewed and updated as appropriate: allergies, current medications, past family history, past medical history, past social history, past surgical history and problem list. Problem list updated.  Objective:   Vitals:   05/05/17 0841  BP: 120/76  Pulse: 76  Weight: 166 lb (75.3 kg)    Fetal Status: Fetal Heart Rate (bpm): 138 Fundal Height: 26 cm Movement: Present     General:  Alert, oriented and cooperative. Patient is in no acute distress.  Skin: Skin is warm and dry. No rash noted.   Cardiovascular: Normal heart rate noted  Respiratory: Normal respiratory effort, no problems with respiration noted  Abdomen: Soft, gravid, appropriate for gestational age.  Pain/Pressure: Absent     Pelvic: Cervical exam deferred        Extremities: Normal range of motion.  Edema: None  Mental Status:  Normal mood and affect. Normal behavior. Normal judgment and thought content.   Assessment and Plan:  Pregnancy: [id number] at 3645w2d  1. Supervision of high risk pregnancy, antepartum Continue  prenatal care. Rhogam and TDaP and 28 wk labs without 2 hour next visit.  2. Previous cesarean section complicating pregnancy, antepartum condition or complication Awaiting size before deciding mode of delivery  3. History of shoulder dystocia in prior pregnancy, currently pregnant  4. Pre-existing diabetes, type 1 On insulin pump--adjustments per endoecrinology  Preterm labor symptoms and general obstetric precautions including but not limited to vaginal bleeding, contractions, leaking of fluid and fetal movement were reviewed in detail with the patient. Please refer to After Visit Summary for other counseling recommendations.  Return in 2 weeks (on 05/19/2017).   Reva Boresanya S [place] Yeats, MD

## 2017-05-08 ENCOUNTER — Encounter: Payer: Self-pay | Admitting: Obstetrics and Gynecology

## 2017-05-08 DIAGNOSIS — O358XX0 Maternal care for other (suspected) fetal abnormality and damage, not applicable or unspecified: Secondary | ICD-10-CM | POA: Insufficient documentation

## 2017-05-12 ENCOUNTER — Encounter (HOSPITAL_COMMUNITY): Payer: Self-pay

## 2017-05-12 ENCOUNTER — Other Ambulatory Visit (HOSPITAL_COMMUNITY): Payer: Self-pay | Admitting: Obstetrics and Gynecology

## 2017-05-12 ENCOUNTER — Other Ambulatory Visit (HOSPITAL_COMMUNITY): Payer: Self-pay | Admitting: *Deleted

## 2017-05-12 ENCOUNTER — Other Ambulatory Visit: Payer: Self-pay | Admitting: *Deleted

## 2017-05-12 ENCOUNTER — Ambulatory Visit (HOSPITAL_COMMUNITY)
Admission: RE | Admit: 2017-05-12 | Discharge: 2017-05-12 | Disposition: A | Discharge status: Home / Self Care | Payer: 59 | Source: Ambulatory Visit | Attending: Obstetrics and Gynecology | Admitting: Obstetrics and Gynecology

## 2017-05-12 DIAGNOSIS — O09899 Supervision of other high risk pregnancies, unspecified trimester: Secondary | ICD-10-CM

## 2017-05-12 DIAGNOSIS — O24012 Pre-existing diabetes mellitus, type 1, in pregnancy, second trimester: Secondary | ICD-10-CM

## 2017-05-12 DIAGNOSIS — O24019 Pre-existing diabetes mellitus, type 1, in pregnancy, unspecified trimester: Secondary | ICD-10-CM

## 2017-05-12 DIAGNOSIS — O34219 Maternal care for unspecified type scar from previous cesarean delivery: Secondary | ICD-10-CM

## 2017-05-12 DIAGNOSIS — O09812 Supervision of pregnancy resulting from assisted reproductive technology, second trimester: Secondary | ICD-10-CM

## 2017-05-12 DIAGNOSIS — O09212 Supervision of pregnancy with history of pre-term labor, second trimester: Secondary | ICD-10-CM | POA: Insufficient documentation

## 2017-05-12 DIAGNOSIS — Z3A26 26 weeks gestation of pregnancy: Secondary | ICD-10-CM

## 2017-05-12 DIAGNOSIS — O24919 Unspecified diabetes mellitus in pregnancy, unspecified trimester: Secondary | ICD-10-CM | POA: Diagnosis present

## 2017-05-12 DIAGNOSIS — O09292 Supervision of pregnancy with other poor reproductive or obstetric history, second trimester: Secondary | ICD-10-CM | POA: Insufficient documentation

## 2017-05-12 DIAGNOSIS — O09219 Supervision of pregnancy with history of pre-term labor, unspecified trimester: Secondary | ICD-10-CM

## 2017-05-12 NOTE — Patient Outreach (Signed)
Karen Alexander called this RNCM because the pharmacy at [facility] told her she had a copay associated with her humalog and Contour Next test strips and she was asking for assistance in correcting this since she is active in the [facility] diabetes in program. This RNCM contacted Rocky LinkKim Portis, [facility] pharmacy operations coordinator and the issue was resolved and Karen Alexander was informed. Requested update from [facility] on how her pregnancy is progressing and her Type I diabetes management.  She states her blood sugars have been  running a little higher than usual because adjustments were made to insulin pump by her endocrinologist because she was having frequent lows. Karen Alexander says she is seeing her endocrinologist every month and her next appointment is 4/12. She says she was also diagnosed with an umbilical cord hernia so she will require more frequent ultrasounds.  She says she is planning on having a vaginal birth unless the size of the baby dictates a C section. Requested that [facility] contact this RNCM for an update after her next obstetrical provider appointment on 4/2. Bary RichardJanet S. Hauser RN,CCM,CDE [facility] Care Management Coordinator Office Phone [phone] Office Fax [phone]

## 2017-05-19 ENCOUNTER — Other Ambulatory Visit (HOSPITAL_COMMUNITY): Payer: Self-pay | Admitting: Obstetrics and Gynecology

## 2017-05-19 ENCOUNTER — Ambulatory Visit (HOSPITAL_COMMUNITY)
Admission: RE | Admit: 2017-05-19 | Discharge: 2017-05-19 | Disposition: A | Discharge status: Home / Self Care | Payer: 59 | Source: Ambulatory Visit | Attending: Obstetrics and Gynecology | Admitting: Obstetrics and Gynecology

## 2017-05-19 ENCOUNTER — Encounter: Payer: Self-pay | Admitting: Obstetrics and Gynecology

## 2017-05-19 ENCOUNTER — Ambulatory Visit (INDEPENDENT_AMBULATORY_CARE_PROVIDER_SITE_OTHER): Payer: 59 | Admitting: Obstetrics and Gynecology

## 2017-05-19 ENCOUNTER — Encounter (HOSPITAL_COMMUNITY): Payer: Self-pay

## 2017-05-19 VITALS — BP 117/80 | HR 72 | Wt 167.0 lb

## 2017-05-19 DIAGNOSIS — O09219 Supervision of pregnancy with history of pre-term labor, unspecified trimester: Secondary | ICD-10-CM

## 2017-05-19 DIAGNOSIS — O24919 Unspecified diabetes mellitus in pregnancy, unspecified trimester: Secondary | ICD-10-CM

## 2017-05-19 DIAGNOSIS — O26892 Other specified pregnancy related conditions, second trimester: Secondary | ICD-10-CM

## 2017-05-19 DIAGNOSIS — O24319 Unspecified pre-existing diabetes mellitus in pregnancy, unspecified trimester: Secondary | ICD-10-CM

## 2017-05-19 DIAGNOSIS — O34219 Maternal care for unspecified type scar from previous cesarean delivery: Secondary | ICD-10-CM | POA: Diagnosis not present

## 2017-05-19 DIAGNOSIS — O0990 Supervision of high risk pregnancy, unspecified, unspecified trimester: Secondary | ICD-10-CM

## 2017-05-19 DIAGNOSIS — Z3A27 27 weeks gestation of pregnancy: Secondary | ICD-10-CM

## 2017-05-19 DIAGNOSIS — O26899 Other specified pregnancy related conditions, unspecified trimester: Secondary | ICD-10-CM | POA: Diagnosis not present

## 2017-05-19 DIAGNOSIS — O09292 Supervision of pregnancy with other poor reproductive or obstetric history, second trimester: Secondary | ICD-10-CM | POA: Insufficient documentation

## 2017-05-19 DIAGNOSIS — O09212 Supervision of pregnancy with history of pre-term labor, second trimester: Secondary | ICD-10-CM

## 2017-05-19 DIAGNOSIS — O24013 Pre-existing diabetes mellitus, type 1, in pregnancy, third trimester: Secondary | ICD-10-CM | POA: Insufficient documentation

## 2017-05-19 DIAGNOSIS — O09299 Supervision of pregnancy with other poor reproductive or obstetric history, unspecified trimester: Secondary | ICD-10-CM

## 2017-05-19 DIAGNOSIS — O09899 Supervision of other high risk pregnancies, unspecified trimester: Secondary | ICD-10-CM

## 2017-05-19 DIAGNOSIS — O09812 Supervision of pregnancy resulting from assisted reproductive technology, second trimester: Secondary | ICD-10-CM

## 2017-05-19 DIAGNOSIS — Z6791 Unspecified blood type, Rh negative: Secondary | ICD-10-CM

## 2017-05-19 DIAGNOSIS — IMO0001 Reserved for inherently not codable concepts without codable children: Secondary | ICD-10-CM

## 2017-05-19 DIAGNOSIS — Z794 Long term (current) use of insulin: Secondary | ICD-10-CM

## 2017-05-19 DIAGNOSIS — O24012 Pre-existing diabetes mellitus, type 1, in pregnancy, second trimester: Secondary | ICD-10-CM | POA: Insufficient documentation

## 2017-05-19 DIAGNOSIS — O24312 Unspecified pre-existing diabetes mellitus in pregnancy, second trimester: Secondary | ICD-10-CM

## 2017-05-19 DIAGNOSIS — O358XX0 Maternal care for other (suspected) fetal abnormality and damage, not applicable or unspecified: Secondary | ICD-10-CM

## 2017-05-19 DIAGNOSIS — Z23 Encounter for immunization: Secondary | ICD-10-CM | POA: Diagnosis not present

## 2017-05-19 DIAGNOSIS — O0992 Supervision of high risk pregnancy, unspecified, second trimester: Secondary | ICD-10-CM

## 2017-05-19 MED ORDER — RHO D IMMUNE GLOBULIN 1500 UNIT/2ML IJ SOSY
300.0000 ug | PREFILLED_SYRINGE | Freq: Once | INTRAMUSCULAR | Status: AC
  Administered 2017-05-19: 300 ug via INTRAMUSCULAR

## 2017-05-19 NOTE — Progress Notes (Signed)
Prenatal Visit Note Date: 05/19/2017 [facility]  Subjective:  Karen MangoMichele [name] is a 32 y.o. [id number] at 6761w2d being seen today for ongoing prenatal care.  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  Patient reports no complaints.   Contractions: Not present. Vag. Bleeding: None.  Movement: Present. Denies leaking of fluid.   The following portions of the patient's history were reviewed and updated as appropriate: allergies, current medications, past family history, past medical history, past social history, past surgical history and problem list. Problem list updated.  Objective:   Vitals:   05/19/17 0933  BP: 117/80  Pulse: 72  Weight: 167 lb (75.8 kg)    Fetal Status: Fetal Heart Rate (bpm): 135   Movement: Present     General:  Alert, oriented and cooperative. Patient is in no acute distress.  Skin: Skin is warm and dry. No rash noted.   Cardiovascular: Normal heart rate noted  Respiratory: Normal respiratory effort, no problems with respiration noted  Abdomen: Soft, gravid, appropriate for gestational age. Pain/Pressure: Absent     Pelvic:  Cervical exam deferred        Extremities: Normal range of motion.  Edema: None  Mental Status: Normal mood and affect. Normal behavior. Normal judgment and thought content.   Urinalysis:      Assessment and Plan:   Pregnancy: [id number] at 6761w2d  1. Supervision of high risk pregnancy, antepartum Routine care. tdap today - CBC - RPR - HIV antibody  2. Rh negative state in antepartum period - Antibody screen - rho (d) immune globulin (RHIG/RHOPHYLAC) injection 300 mcg  3. Immunization due - Tdap vaccine greater than or equal to 7yo IM  4. Pre-existing insulin-dependent diabetes mellitus during pregnancy, antepartum (HCC) Followed by endocrine. Has f/u with them next week  5. Previous cesarean section complicating pregnancy, antepartum condition or complication D/w her re: various options for delivery. Will d/w her more nv  6. History of preterm delivery at 36 weeks, currently pregnant Not on anything now  7. Umbilical vein abnormality affecting pregnancy, single or unspecified fetus Has bpp today. To increase to 2x/week bpp at 28wks  Preterm labor symptoms and general obstetric precautions including but not limited to vaginal bleeding, contractions, leaking of fluid and fetal movement were reviewed in detail with the patient. Please refer to After Visit Summary for other counseling recommendations.  Return in about 2 weeks (around 06/02/2017) for rob.   [place] BingPickens, Chivon Lepage, MD

## 2017-05-20 LAB — CBC
Hematocrit: 38.4 % (ref 34.0–46.6)
Hemoglobin: 12.5 g/dL (ref 11.1–15.9)
MCH: 30.6 pg (ref 26.6–33.0)
MCHC: 32.6 g/dL (ref 31.5–35.7)
MCV: 94 fL (ref 79–97)
PLATELETS: 214 10*3/uL (ref 150–379)
RBC: 4.09 x10E6/uL (ref 3.77–5.28)
RDW: 14.4 % (ref 12.3–15.4)
WBC: 5.8 10*3/uL (ref 3.4–10.8)

## 2017-05-20 LAB — ANTIBODY SCREEN: ANTIBODY SCREEN: NEGATIVE

## 2017-05-20 LAB — RPR: RPR: NONREACTIVE

## 2017-05-20 LAB — HIV ANTIBODY (ROUTINE TESTING W REFLEX): HIV Screen 4th Generation wRfx: NONREACTIVE

## 2017-05-25 DIAGNOSIS — O24012 Pre-existing diabetes mellitus, type 1, in pregnancy, second trimester: Secondary | ICD-10-CM | POA: Diagnosis not present

## 2017-05-26 ENCOUNTER — Ambulatory Visit (HOSPITAL_COMMUNITY)
Admission: RE | Admit: 2017-05-26 | Discharge: 2017-05-26 | Disposition: A | Discharge status: Home / Self Care | Payer: 59 | Source: Ambulatory Visit | Attending: Family Medicine | Admitting: Family Medicine

## 2017-05-26 ENCOUNTER — Other Ambulatory Visit (HOSPITAL_COMMUNITY): Payer: Self-pay | Admitting: *Deleted

## 2017-05-26 ENCOUNTER — Other Ambulatory Visit (HOSPITAL_COMMUNITY): Payer: Self-pay | Admitting: Obstetrics and Gynecology

## 2017-05-26 ENCOUNTER — Encounter (HOSPITAL_COMMUNITY): Payer: Self-pay

## 2017-05-26 DIAGNOSIS — O24012 Pre-existing diabetes mellitus, type 1, in pregnancy, second trimester: Secondary | ICD-10-CM | POA: Insufficient documentation

## 2017-05-26 DIAGNOSIS — O09813 Supervision of pregnancy resulting from assisted reproductive technology, third trimester: Secondary | ICD-10-CM | POA: Insufficient documentation

## 2017-05-26 DIAGNOSIS — O24013 Pre-existing diabetes mellitus, type 1, in pregnancy, third trimester: Secondary | ICD-10-CM | POA: Diagnosis not present

## 2017-05-26 DIAGNOSIS — E109 Type 1 diabetes mellitus without complications: Secondary | ICD-10-CM | POA: Diagnosis not present

## 2017-05-26 DIAGNOSIS — O09293 Supervision of pregnancy with other poor reproductive or obstetric history, third trimester: Secondary | ICD-10-CM | POA: Insufficient documentation

## 2017-05-26 DIAGNOSIS — O34219 Maternal care for unspecified type scar from previous cesarean delivery: Secondary | ICD-10-CM

## 2017-05-26 DIAGNOSIS — Z3A28 28 weeks gestation of pregnancy: Secondary | ICD-10-CM

## 2017-05-26 DIAGNOSIS — O09819 Supervision of pregnancy resulting from assisted reproductive technology, unspecified trimester: Secondary | ICD-10-CM

## 2017-05-26 DIAGNOSIS — O24019 Pre-existing diabetes mellitus, type 1, in pregnancy, unspecified trimester: Secondary | ICD-10-CM

## 2017-05-26 DIAGNOSIS — O358XX0 Maternal care for other (suspected) fetal abnormality and damage, not applicable or unspecified: Secondary | ICD-10-CM | POA: Diagnosis not present

## 2017-05-26 DIAGNOSIS — Z794 Long term (current) use of insulin: Secondary | ICD-10-CM

## 2017-05-26 DIAGNOSIS — IMO0001 Reserved for inherently not codable concepts without codable children: Secondary | ICD-10-CM

## 2017-05-26 DIAGNOSIS — O0990 Supervision of high risk pregnancy, unspecified, unspecified trimester: Secondary | ICD-10-CM

## 2017-05-26 DIAGNOSIS — Z6791 Unspecified blood type, Rh negative: Secondary | ICD-10-CM

## 2017-05-26 DIAGNOSIS — O24319 Unspecified pre-existing diabetes mellitus in pregnancy, unspecified trimester: Secondary | ICD-10-CM

## 2017-05-26 DIAGNOSIS — O26899 Other specified pregnancy related conditions, unspecified trimester: Secondary | ICD-10-CM

## 2017-05-26 DIAGNOSIS — O09299 Supervision of pregnancy with other poor reproductive or obstetric history, unspecified trimester: Secondary | ICD-10-CM

## 2017-05-26 NOTE — Addendum Note (Signed)
Encounter addended by: Levonne HubertStalter, Yared Susan M, RDMS, RVT on: 05/26/2017 12:43 PM  Actions taken: Imaging Exam ended

## 2017-05-27 ENCOUNTER — Other Ambulatory Visit (HOSPITAL_COMMUNITY): Payer: Self-pay | Admitting: *Deleted

## 2017-05-27 DIAGNOSIS — O358XX0 Maternal care for other (suspected) fetal abnormality and damage, not applicable or unspecified: Secondary | ICD-10-CM

## 2017-05-29 DIAGNOSIS — O24012 Pre-existing diabetes mellitus, type 1, in pregnancy, second trimester: Secondary | ICD-10-CM | POA: Diagnosis not present

## 2017-05-29 DIAGNOSIS — Z9641 Presence of insulin pump (external) (internal): Secondary | ICD-10-CM | POA: Diagnosis not present

## 2017-06-01 ENCOUNTER — Ambulatory Visit (INDEPENDENT_AMBULATORY_CARE_PROVIDER_SITE_OTHER): Payer: 59 | Admitting: Obstetrics and Gynecology

## 2017-06-01 VITALS — BP 109/72 | HR 72 | Wt 173.6 lb

## 2017-06-01 DIAGNOSIS — O24011 Pre-existing diabetes mellitus, type 1, in pregnancy, first trimester: Secondary | ICD-10-CM

## 2017-06-01 DIAGNOSIS — O09299 Supervision of pregnancy with other poor reproductive or obstetric history, unspecified trimester: Secondary | ICD-10-CM

## 2017-06-01 DIAGNOSIS — O09899 Supervision of other high risk pregnancies, unspecified trimester: Secondary | ICD-10-CM

## 2017-06-01 DIAGNOSIS — O26892 Other specified pregnancy related conditions, second trimester: Secondary | ICD-10-CM

## 2017-06-01 DIAGNOSIS — O26899 Other specified pregnancy related conditions, unspecified trimester: Secondary | ICD-10-CM

## 2017-06-01 DIAGNOSIS — O09292 Supervision of pregnancy with other poor reproductive or obstetric history, second trimester: Secondary | ICD-10-CM

## 2017-06-01 DIAGNOSIS — O0992 Supervision of high risk pregnancy, unspecified, second trimester: Secondary | ICD-10-CM

## 2017-06-01 DIAGNOSIS — O09212 Supervision of pregnancy with history of pre-term labor, second trimester: Secondary | ICD-10-CM

## 2017-06-01 DIAGNOSIS — O0990 Supervision of high risk pregnancy, unspecified, unspecified trimester: Secondary | ICD-10-CM

## 2017-06-01 DIAGNOSIS — IMO0001 Reserved for inherently not codable concepts without codable children: Secondary | ICD-10-CM

## 2017-06-01 DIAGNOSIS — O34219 Maternal care for unspecified type scar from previous cesarean delivery: Secondary | ICD-10-CM

## 2017-06-01 DIAGNOSIS — Z6791 Unspecified blood type, Rh negative: Secondary | ICD-10-CM

## 2017-06-01 DIAGNOSIS — O24012 Pre-existing diabetes mellitus, type 1, in pregnancy, second trimester: Secondary | ICD-10-CM

## 2017-06-01 DIAGNOSIS — O24312 Unspecified pre-existing diabetes mellitus in pregnancy, second trimester: Secondary | ICD-10-CM

## 2017-06-01 DIAGNOSIS — O24319 Unspecified pre-existing diabetes mellitus in pregnancy, unspecified trimester: Secondary | ICD-10-CM

## 2017-06-01 DIAGNOSIS — Z794 Long term (current) use of insulin: Secondary | ICD-10-CM

## 2017-06-01 DIAGNOSIS — O09219 Supervision of pregnancy with history of pre-term labor, unspecified trimester: Secondary | ICD-10-CM

## 2017-06-01 LAB — POCT URINE QUALITATIVE DIPSTICK BLOOD: Blood, UA: NEGATIVE

## 2017-06-01 NOTE — Progress Notes (Signed)
Prenatal Visit Note Date: 06/01/2017 [facility]  Subjective:  Karen Alexander is a 32 y.o. [id number] at 6161w1d being seen today for ongoing prenatal care.  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  Patient reports no complaints.   Contractions: Not present.  .  Movement: Present. Denies leaking of fluid.   The following portions of the patient's history were reviewed and updated as appropriate: allergies, current medications, past family history, past medical history, past social history, past surgical history and problem list. Problem list updated.  Objective:   Vitals:   06/01/17 1620  BP: 109/72  Pulse: 72  Weight: 173 lb 9.6 oz (78.7 kg)    Fetal Status: Fetal Heart Rate (bpm): 146   Movement: Present     General:  Alert, oriented and cooperative. Patient is in no acute distress.  Skin: Skin is warm and dry. No rash noted.   Cardiovascular: Normal heart rate noted  Respiratory: Normal respiratory effort, no problems with respiration noted  Abdomen: Soft, gravid, appropriate for gestational age. Pain/Pressure: Absent     Pelvic:  Cervical exam deferred        Extremities: Normal range of motion.     Mental Status: Normal mood and affect. Normal  behavior. Normal judgment and thought content.   Urinalysis:      Assessment and Plan:  Pregnancy: [id number] at 1761w1d  1. Pre-existing type 1 diabetes mellitus during pregnancy in first trimester Followed by endocrine and continuing to better titrate her insulin pump regimen.  3/19: efw 50%, 773gm, AC 72%, AFI normal. Already getting AP testing due to UVV.   2. Pre-existing insulin-dependent diabetes mellitus during pregnancy, antepartum (HCC) See above.   3. Supervision of high risk pregnancy, antepartum D/w pt re: BC nv  4. Previous cesarean delivery, antepartum condition or complication D/w pt to consider re: delivery mode and will follow up with pt more at next visit.   5. Prior fetal macrosomia, antepartum See above  6. Rh negative state in antepartum period S/p rhogam at 28wks already  7. History of preterm delivery at 36 weeks, currently pregnant Declined 17p. Not on anything this pregnancy  8. Umbilical cord complication, single or unspecified fetus Getting bid NSTs. Next one is tomorrow. Continue to follow  Preterm labor symptoms and general obstetric precautions including but not limited to vaginal bleeding, contractions, leaking of fluid and fetal movement were reviewed in detail with the patient. Please refer to After Visit Summary for other counseling recommendations.  Return in about 2 weeks (around 06/15/2017) for rob.   [place] BingPickens, Mikolaj Woolstenhulme, MD

## 2017-06-02 ENCOUNTER — Other Ambulatory Visit: Payer: Self-pay | Admitting: *Deleted

## 2017-06-02 ENCOUNTER — Encounter (HOSPITAL_COMMUNITY): Payer: Self-pay

## 2017-06-02 ENCOUNTER — Ambulatory Visit (HOSPITAL_COMMUNITY)
Admission: RE | Admit: 2017-06-02 | Discharge: 2017-06-02 | Disposition: A | Discharge status: Home / Self Care | Payer: 59 | Source: Ambulatory Visit | Attending: Family Medicine | Admitting: Family Medicine

## 2017-06-02 DIAGNOSIS — Z3A29 29 weeks gestation of pregnancy: Secondary | ICD-10-CM | POA: Diagnosis not present

## 2017-06-02 DIAGNOSIS — Z794 Long term (current) use of insulin: Secondary | ICD-10-CM | POA: Diagnosis not present

## 2017-06-02 DIAGNOSIS — O24319 Unspecified pre-existing diabetes mellitus in pregnancy, unspecified trimester: Secondary | ICD-10-CM

## 2017-06-02 DIAGNOSIS — O24113 Pre-existing diabetes mellitus, type 2, in pregnancy, third trimester: Secondary | ICD-10-CM | POA: Diagnosis not present

## 2017-06-02 DIAGNOSIS — O26899 Other specified pregnancy related conditions, unspecified trimester: Secondary | ICD-10-CM

## 2017-06-02 DIAGNOSIS — O0990 Supervision of high risk pregnancy, unspecified, unspecified trimester: Secondary | ICD-10-CM

## 2017-06-02 DIAGNOSIS — O358XX0 Maternal care for other (suspected) fetal abnormality and damage, not applicable or unspecified: Secondary | ICD-10-CM | POA: Diagnosis not present

## 2017-06-02 DIAGNOSIS — IMO0001 Reserved for inherently not codable concepts without codable children: Secondary | ICD-10-CM

## 2017-06-02 DIAGNOSIS — O34219 Maternal care for unspecified type scar from previous cesarean delivery: Secondary | ICD-10-CM | POA: Diagnosis not present

## 2017-06-02 DIAGNOSIS — O09299 Supervision of pregnancy with other poor reproductive or obstetric history, unspecified trimester: Secondary | ICD-10-CM

## 2017-06-02 DIAGNOSIS — Z6791 Unspecified blood type, Rh negative: Secondary | ICD-10-CM

## 2017-06-02 NOTE — Patient Outreach (Signed)
[facility] Care Management  06/02/2017  Karen Alexander [name] 06-Mar-1985 [id number]   Secure e-mail sent to Karen Alexander to her [facility] e-mail address requesting update on her glucose management since seeing Dr Tedd SiasSolum, her endocrinologist, on 4/12 with changes made to her pump due to fasting and post prandial hyperglycemia and Hgb A1C= 6.8% on 4/8 previously 6.5 on 04/21/17.  Await response from [place].  Bary RichardJanet S. Hauser RN,CCM,CDE [facility] Care Management Coordinator Office Phone [phone] Office Fax [phone]

## 2017-06-03 ENCOUNTER — Other Ambulatory Visit: Payer: Self-pay | Admitting: *Deleted

## 2017-06-03 NOTE — Patient Outreach (Signed)
[facility] Care Management  06/03/2017  Karen Alexander 08-12-1985 [id number]   Received the following e-mail update from [facility] regarding her diabetes management during pregnancy.    Yes, I saw Dr. Tedd SiasSolum on Friday, 05-29-17 and Dr. Vergie LivingPickens (OB) on Monday, 06-01-17. My insulin needs are still increasing which are to be expected from here on out. I always have the most trouble with glucose control in the third trimester. No hypoglycemia though. Dr. Tedd SiasSolum did make changes on my insulin pump at my last visit. She is keeping a closer watch on my sugars on the home stretch and I go back to see her on 06-12-17.   I follow up with Dr. Macon LargeAnyanwu (OB) on 06-16-17.  Currently, I am having bi-weekly ultrasounds due to the umbilical vein varix that was found a few weeks ago since I am over [redacted] weeks pregnant now and will continue this until delivery around 37 weeks. It has remained around the same in size.    Sent return e-mail thanking Marcelino DusterMichelle for the update. Will plan to ensure monthly contact with [place] JesterMichele to assess Type I diabetes self management  until she delivers.   Bary RichardJanet S. Hauser RN,CCM,CDE [facility] Care Management Coordinator Office Phone [phone] Office Fax [phone]

## 2017-06-05 ENCOUNTER — Ambulatory Visit (HOSPITAL_COMMUNITY)
Admission: RE | Admit: 2017-06-05 | Discharge: 2017-06-05 | Disposition: A | Discharge status: Home / Self Care | Payer: 59 | Source: Ambulatory Visit | Attending: Family Medicine | Admitting: Family Medicine

## 2017-06-05 ENCOUNTER — Encounter (HOSPITAL_COMMUNITY): Payer: Self-pay

## 2017-06-05 DIAGNOSIS — O24013 Pre-existing diabetes mellitus, type 1, in pregnancy, third trimester: Secondary | ICD-10-CM | POA: Insufficient documentation

## 2017-06-05 DIAGNOSIS — O09293 Supervision of pregnancy with other poor reproductive or obstetric history, third trimester: Secondary | ICD-10-CM | POA: Insufficient documentation

## 2017-06-05 DIAGNOSIS — Z3A29 29 weeks gestation of pregnancy: Secondary | ICD-10-CM | POA: Diagnosis not present

## 2017-06-05 DIAGNOSIS — O09213 Supervision of pregnancy with history of pre-term labor, third trimester: Secondary | ICD-10-CM | POA: Diagnosis not present

## 2017-06-05 DIAGNOSIS — O358XX0 Maternal care for other (suspected) fetal abnormality and damage, not applicable or unspecified: Secondary | ICD-10-CM | POA: Diagnosis not present

## 2017-06-05 DIAGNOSIS — O34219 Maternal care for unspecified type scar from previous cesarean delivery: Secondary | ICD-10-CM | POA: Diagnosis not present

## 2017-06-05 DIAGNOSIS — O09813 Supervision of pregnancy resulting from assisted reproductive technology, third trimester: Secondary | ICD-10-CM | POA: Insufficient documentation

## 2017-06-09 ENCOUNTER — Other Ambulatory Visit (HOSPITAL_COMMUNITY): Payer: Self-pay | Admitting: Obstetrics and Gynecology

## 2017-06-09 ENCOUNTER — Ambulatory Visit (HOSPITAL_COMMUNITY)
Admission: RE | Admit: 2017-06-09 | Discharge: 2017-06-09 | Disposition: A | Discharge status: Home / Self Care | Payer: 59 | Source: Ambulatory Visit | Attending: Family Medicine | Admitting: Family Medicine

## 2017-06-09 ENCOUNTER — Encounter (HOSPITAL_COMMUNITY): Payer: Self-pay

## 2017-06-09 DIAGNOSIS — O24013 Pre-existing diabetes mellitus, type 1, in pregnancy, third trimester: Secondary | ICD-10-CM | POA: Insufficient documentation

## 2017-06-09 DIAGNOSIS — O358XX0 Maternal care for other (suspected) fetal abnormality and damage, not applicable or unspecified: Secondary | ICD-10-CM | POA: Diagnosis not present

## 2017-06-09 DIAGNOSIS — O09813 Supervision of pregnancy resulting from assisted reproductive technology, third trimester: Secondary | ICD-10-CM | POA: Insufficient documentation

## 2017-06-09 DIAGNOSIS — Z3A30 30 weeks gestation of pregnancy: Secondary | ICD-10-CM | POA: Diagnosis not present

## 2017-06-12 ENCOUNTER — Other Ambulatory Visit (HOSPITAL_COMMUNITY): Payer: Self-pay | Admitting: Obstetrics and Gynecology

## 2017-06-12 ENCOUNTER — Encounter (HOSPITAL_COMMUNITY): Payer: Self-pay

## 2017-06-12 ENCOUNTER — Ambulatory Visit (HOSPITAL_COMMUNITY)
Admission: RE | Admit: 2017-06-12 | Discharge: 2017-06-12 | Disposition: A | Discharge status: Home / Self Care | Payer: 59 | Source: Ambulatory Visit | Attending: Obstetrics & Gynecology | Admitting: Obstetrics & Gynecology

## 2017-06-12 DIAGNOSIS — O09213 Supervision of pregnancy with history of pre-term labor, third trimester: Secondary | ICD-10-CM | POA: Insufficient documentation

## 2017-06-12 DIAGNOSIS — Z3A30 30 weeks gestation of pregnancy: Secondary | ICD-10-CM

## 2017-06-12 DIAGNOSIS — O358XX0 Maternal care for other (suspected) fetal abnormality and damage, not applicable or unspecified: Secondary | ICD-10-CM

## 2017-06-12 DIAGNOSIS — Z9641 Presence of insulin pump (external) (internal): Secondary | ICD-10-CM | POA: Diagnosis not present

## 2017-06-12 DIAGNOSIS — O34219 Maternal care for unspecified type scar from previous cesarean delivery: Secondary | ICD-10-CM | POA: Insufficient documentation

## 2017-06-12 DIAGNOSIS — O24013 Pre-existing diabetes mellitus, type 1, in pregnancy, third trimester: Secondary | ICD-10-CM | POA: Diagnosis not present

## 2017-06-12 DIAGNOSIS — O09293 Supervision of pregnancy with other poor reproductive or obstetric history, third trimester: Secondary | ICD-10-CM | POA: Diagnosis not present

## 2017-06-12 DIAGNOSIS — O09813 Supervision of pregnancy resulting from assisted reproductive technology, third trimester: Secondary | ICD-10-CM | POA: Insufficient documentation

## 2017-06-12 DIAGNOSIS — O24012 Pre-existing diabetes mellitus, type 1, in pregnancy, second trimester: Secondary | ICD-10-CM | POA: Diagnosis not present

## 2017-06-16 ENCOUNTER — Other Ambulatory Visit (HOSPITAL_COMMUNITY): Payer: Self-pay | Admitting: Obstetrics and Gynecology

## 2017-06-16 ENCOUNTER — Encounter: Payer: Self-pay | Admitting: Obstetrics & Gynecology

## 2017-06-16 ENCOUNTER — Encounter: Payer: Self-pay | Admitting: *Deleted

## 2017-06-16 ENCOUNTER — Other Ambulatory Visit (HOSPITAL_COMMUNITY): Payer: Self-pay | Admitting: *Deleted

## 2017-06-16 ENCOUNTER — Ambulatory Visit (INDEPENDENT_AMBULATORY_CARE_PROVIDER_SITE_OTHER): Payer: 59 | Admitting: Obstetrics & Gynecology

## 2017-06-16 ENCOUNTER — Ambulatory Visit (HOSPITAL_COMMUNITY)
Admission: RE | Admit: 2017-06-16 | Discharge: 2017-06-16 | Disposition: A | Discharge status: Home / Self Care | Payer: 59 | Source: Ambulatory Visit | Attending: Obstetrics & Gynecology | Admitting: Obstetrics & Gynecology

## 2017-06-16 ENCOUNTER — Encounter (HOSPITAL_COMMUNITY): Payer: Self-pay

## 2017-06-16 VITALS — BP 114/75 | HR 78 | Wt 177.6 lb

## 2017-06-16 DIAGNOSIS — O24013 Pre-existing diabetes mellitus, type 1, in pregnancy, third trimester: Secondary | ICD-10-CM | POA: Insufficient documentation

## 2017-06-16 DIAGNOSIS — IMO0001 Reserved for inherently not codable concepts without codable children: Secondary | ICD-10-CM

## 2017-06-16 DIAGNOSIS — Z3A31 31 weeks gestation of pregnancy: Secondary | ICD-10-CM | POA: Insufficient documentation

## 2017-06-16 DIAGNOSIS — E109 Type 1 diabetes mellitus without complications: Secondary | ICD-10-CM | POA: Diagnosis not present

## 2017-06-16 DIAGNOSIS — O358XX0 Maternal care for other (suspected) fetal abnormality and damage, not applicable or unspecified: Secondary | ICD-10-CM

## 2017-06-16 DIAGNOSIS — O09213 Supervision of pregnancy with history of pre-term labor, third trimester: Secondary | ICD-10-CM | POA: Insufficient documentation

## 2017-06-16 DIAGNOSIS — O09293 Supervision of pregnancy with other poor reproductive or obstetric history, third trimester: Secondary | ICD-10-CM | POA: Diagnosis not present

## 2017-06-16 DIAGNOSIS — O34219 Maternal care for unspecified type scar from previous cesarean delivery: Secondary | ICD-10-CM

## 2017-06-16 DIAGNOSIS — O09813 Supervision of pregnancy resulting from assisted reproductive technology, third trimester: Secondary | ICD-10-CM

## 2017-06-16 DIAGNOSIS — O0990 Supervision of high risk pregnancy, unspecified, unspecified trimester: Secondary | ICD-10-CM

## 2017-06-16 DIAGNOSIS — O24019 Pre-existing diabetes mellitus, type 1, in pregnancy, unspecified trimester: Secondary | ICD-10-CM

## 2017-06-16 NOTE — Progress Notes (Signed)
PRENATAL VISIT NOTE  Subjective:  Karen Alexander is a 32 y.o. A5W0981 at [redacted]w[redacted]d being seen today for ongoing prenatal care.  She is currently monitored for the following issues for this high-risk pregnancy and has Type 1 diabetes mellitus (HCC); Supervision of high risk pregnancy, antepartum; Pre-existing type 1 diabetes mellitus during pregnancy in third trimester; Previous cesarean delivery, antepartum condition or complication; Prior fetal macrosomia, antepartum; Rh negative state in antepartum period; History of preterm delivery at 36 weeks, currently pregnant; Pregnancy resulting from assisted reproductive technology in second trimester; History of third degree perineal laceration; Umbilical vein abnormality complicating pregnancy; and Pregnancy with poor obstetric history on their problem list.  Patient reports no complaints.   .  .  Movement: Present. Denies leaking of fluid.   The following portions of the patient's history were reviewed and updated as appropriate: allergies, current medications, past family history, past medical history, past social history, past surgical history and problem list. Problem list updated.  Objective:   Vitals:   06/16/17 0954  BP: 114/75  Pulse: 78  Weight: 177 lb 9.6 oz (80.6 kg)    Fetal Status: Fetal Heart Rate (bpm): 147   Movement: Present     General:  Alert, oriented and cooperative. Patient is in no acute distress.  Skin: Skin is warm and dry. No rash noted.   Cardiovascular: Normal heart rate noted  Respiratory: Normal respiratory effort, no problems with respiration noted  Abdomen: Soft, gravid, appropriate for gestational age.  Pain/Pressure: Absent     Pelvic: Cervical exam deferred        Extremities: Normal range of motion.  Edema: None  Mental Status: Normal mood and affect. Normal behavior. Normal judgment and thought content.   [place] Mfm Fetal Bpp Wo Non Stress  Result Date:  06/16/2017 ----------------------------------------------------------------------  OBSTETRICS REPORT                      (Signed Final 06/16/2017 08:30 am) ---------------------------------------------------------------------- Patient Info  ID #:       [id number]                          D.O.B.:  [birth year] (31 yrs)  Name:       Karen Alexander                Visit Date: 06/16/2017 07:54 am ---------------------------------------------------------------------- Performed By  Performed By:     Gardenia Phlegm Phy.:   Madelaine Bhat MD  Attending:        Charlsie Merles MD         Address:          [address]  [address]                                                             [place], [place]                                                             [address]  Referred By:      Mila Merry           Location:         [facility] for                    [facility]                    Healthcare  Ref. Address:     [address]                    [address] ---------------------------------------------------------------------- Orders   #  Description                                 Code   1  [place] MFM FETAL BPP WO NON STRESS              [phone]  ----------------------------------------------------------------------   #  Ordered By               Order #        Accession #    Episode #   1  MARK Ezzard Standing              [id number]      [id number]     [id number]  ---------------------------------------------------------------------- Indications   [redacted] weeks gestation of pregnancy                Z3A.31   Pregnancy resulting from assisted              O09.819   reproductive technology (IVF)   Poor obstetric history: Prior fetal            O09.299   macrosomia, antepartum   History of cesarean delivery, currently        O34.219   pregnant   Poor  obstetric history: Previous preterm       O09.219   delivery, antepartum @ 36wks   Umbilical vein abnormality complicating        O69.9XX0   pregnancy (UVV)   Pre-existing diabetes, type 1, in pregnancy,   O24.013   third trimester (insulin pump)  ---------------------------------------------------------------------- OB History  Gravidity:    4         Term:   1        Prem:   1  Living:       2 ---------------------------------------------------------------------- Fetal Evaluation  Num Of Fetuses:     1  Fetal Heart         128  Rate(bpm):  Cardiac Activity:  Observed  Presentation:       Cephalic  Amniotic Fluid  AFI FV:      Subjectively within normal limits  AFI Sum(cm)     %Tile       Largest Pocket(cm)  14.33           49          4.39  RUQ(cm)       RLQ(cm)       LUQ(cm)        LLQ(cm)  2.66          4.39          3.05           4.23 ---------------------------------------------------------------------- Biophysical Evaluation  Amniotic F.V:   Within normal limits       F. Tone:        Observed  F. Movement:    Observed                   Score:          8/8  F. Breathing:   Observed ---------------------------------------------------------------------- Gestational Age  Best:          31w 2d     Det. By:  D.O. Conception          EDD:   08/16/17 ---------------------------------------------------------------------- Anatomy  Abdomen:               Umbilical vein                         varix (1.0) ---------------------------------------------------------------------- Impression  Intrauterine pregnancy at 31+2 weeks with an umbilical vein  varix  Normal amniotic fluid  BPP 8/8  Varix stable at 10mm with no turbulent flow ---------------------------------------------------------------------- Recommendations  Continue twice-weekly varix evaluations and serial growth  scans ----------------------------------------------------------------------                 Charlsie Merles, MD Electronically Signed Final Report    06/16/2017 08:30 am ----------------------------------------------------------------------  [place] Mfm Fetal Bpp Wo Non Stress  Result Date: 06/12/2017 ----------------------------------------------------------------------  OBSTETRICS REPORT                      (Signed Final 06/12/2017 06:48 pm) ---------------------------------------------------------------------- Patient Info  ID #:       [id number]                          D.O.B.:  Nov 28, 1985 (31 yrs)  Name:       Karen Alexander                Visit Date: 06/12/2017 08:21 am ---------------------------------------------------------------------- Performed By  Performed By:     Tommi Emery         Secondary Phy.:   UGONNA A                    RDMS                                                             Macon Large MD  Attending:        Particia Nearing MD       Address:          [phone]  [address]                                                             [place], [place]                                                             [address]  Referred By:      Mila Merry           Location:         [facility] for                    [facility]                    Healthcare  Ref. Address:     [address]                    [address] ---------------------------------------------------------------------- Orders   #  Description                                 Code   1  [place] MFM FETAL BPP WO NON STRESS              [phone]  ----------------------------------------------------------------------   #  Ordered By               Order #        Accession #    Episode #   1  MARK Ezzard Standing              [id number]      [id number]     [id number]  ---------------------------------------------------------------------- Indications   [redacted] weeks gestation of pregnancy                Z3A.30   Pregnancy resulting from assisted              O09.819   reproductive technology (IVF)   Poor obstetric history:  Prior fetal            O09.299   macrosomia, antepartum   History of cesarean delivery, currently        O34.219   pregnant   Poor obstetric history: Previous preterm       O09.219   delivery, antepartum @ 36wks   Umbilical vein abnormality complicating        O69.9XX0   pregnancy (UVV)   Pre-existing diabetes, type 1, in pregnancy,   O24.013   third trimester (insulin pump)  ---------------------------------------------------------------------- OB History  Gravidity:    4  Term:   1        Prem:   1  Living:       2 ---------------------------------------------------------------------- Fetal Evaluation  Num Of Fetuses:     1  Fetal Heart         124  Rate(bpm):  Cardiac Activity:   Observed  Presentation:       Cephalic  Amniotic Fluid  AFI FV:      Subjectively within normal limits  AFI Sum(cm)     %Tile       Largest Pocket(cm)  19.71           76          6.54  RUQ(cm)       RLQ(cm)       LUQ(cm)        LLQ(cm)  5.45          2.65          5.07           6.54 ---------------------------------------------------------------------- Biophysical Evaluation  Amniotic F.V:   Within normal limits       F. Tone:        Observed  F. Movement:    Observed                   Score:          8/8  F. Breathing:   Observed ---------------------------------------------------------------------- Gestational Age  Best:          30w 5d     Det. By:  D.O. Conception          EDD:   08/16/17 ---------------------------------------------------------------------- Anatomy  Abdomen:               Umbilical vein                         varix 0.9cm ---------------------------------------------------------------------- Impression  SIUP at 30+5 weeks with cardiac activity  UVV - no filling defecs  Normal amniotic fluid volume  BPP 8/8 ---------------------------------------------------------------------- Recommendations  Continue twice weekly surveillance of UVV  Next growth [place]  05/14  ----------------------------------------------------------------------                 Particia Nearing, MD Electronically Signed Final Report   06/12/2017 06:48 pm ----------------------------------------------------------------------  [place] Mfm Fetal Bpp Wo Non Stress  Result Date: 06/09/2017 ----------------------------------------------------------------------  OBSTETRICS REPORT                      (Signed Final 06/09/2017 08:50 am) ---------------------------------------------------------------------- Patient Info  ID #:       [id number]                          D.O.B.:  01-28-1986 (31 yrs)  Name:       Karen Alexander                Visit Date: 06/09/2017 07:41 am ---------------------------------------------------------------------- Performed By  Performed By:     Vivien Rota        Secondary Phy.:   UGONNA A                    RDMS  Macon Large MD  Attending:        Darlyn Read MD         Address:          [address]                                                             [place], [place]                                                             [address]  Referred By:      Mila Merry           Location:         [facility] for                    [facility]                    Healthcare  Ref. Address:     [address]                    [address] ---------------------------------------------------------------------- Orders   #  Description                                 Code   1  [place] MFM FETAL BPP WO NON STRESS              [phone]  ----------------------------------------------------------------------   #  Ordered By               Order #        Accession #    Episode #   1  MARK Ezzard Standing              [id number]      [id number]     [id number]  ---------------------------------------------------------------------- Indications   [redacted] weeks gestation of  pregnancy                Z3A.30   Pregnancy resulting from assisted              O09.819   reproductive technology (IVF)   Poor obstetric history: Prior fetal            O09.299   macrosomia, antepartum   History of cesarean delivery, currently        O34.219   pregnant   Poor obstetric history: Previous preterm       O09.219   delivery, antepartum @ 36wks   Umbilical vein abnormality complicating  O69.9XX0   pregnancy (UVV)   Pre-existing diabetes, type 1, in pregnancy,   O24.013   third trimester (insulin pump)  ---------------------------------------------------------------------- OB History  Gravidity:    4         Term:   1        Prem:   1  Living:       2 ---------------------------------------------------------------------- Fetal Evaluation  Num Of Fetuses:     1  Fetal Heart         123  Rate(bpm):  Cardiac Activity:   Observed  Presentation:       Breech  Amniotic Fluid  AFI FV:      Subjectively within normal limits  AFI Sum(cm)     %Tile       Largest Pocket(cm)  18.9            72          6.78  RUQ(cm)       RLQ(cm)       LUQ(cm)        LLQ(cm)  4.54          6.78          4.25           3.33 ---------------------------------------------------------------------- Biophysical Evaluation  Amniotic F.V:   Pocket => 2 cm two         F. Tone:        Observed                  planes  F. Movement:    Observed                   Score:          8/8  F. Breathing:   Observed ---------------------------------------------------------------------- Gestational Age  Clinical EDD:  30w 2d                                        EDD:   08/16/17  Best:          30w 2d     Det. By:  Clinical EDD             EDD:   08/16/17 ---------------------------------------------------------------------- Anatomy  Abdomen:               Umbilical vein                         varix 1.1cm ---------------------------------------------------------------------- Comments  Normal fetal echo.  ---------------------------------------------------------------------- Impression  Single living intrauterine pregnancy at 30w 2d.  Breech presentation.  Normal amniotic fluid volume.  BPP 8/8.  The umbilical vein measures 1.1cm without filling defect  visualized. ---------------------------------------------------------------------- Recommendations  Continue twice weekly UVV surveillance and BPPs, serial [place]  for growth. ----------------------------------------------------------------------                   Darlyn Read, MD Electronically Signed Final Report   06/09/2017 08:50 am ----------------------------------------------------------------------  [place] Mfm Fetal Bpp Wo Non Stress  Result Date: 06/05/2017 ----------------------------------------------------------------------  OBSTETRICS REPORT                      (Signed Final 06/05/2017 11:50 am) ---------------------------------------------------------------------- Patient Info  ID #:       [id number]  D.O.B.:  08-19-1985 (31 yrs)  Name:       Karen Alexander                Visit Date: 06/05/2017 11:20 am ---------------------------------------------------------------------- Performed By  Performed By:     Hurman Horn          Secondary Phy.:   Jethro Bastos                    RDMS                                                             Macon Large MD  Attending:        Durwin Nora       Address:          [address]                    MD                                                             [address]                                                             [place], [place]                                                             [address]  Referred By:      Mila Merry           Location:         [facility] for                    [facility]                    Healthcare  Ref. Address:     [address]                    [address] ----------------------------------------------------------------------  Orders   #  Description                                 Code   1  [place] MFM FETAL BPP WO NON STRESS              [phone]  ----------------------------------------------------------------------   #  Ordered By               Order #        Accession #  Episode #   1  MARK Ezzard Standing              [id number]      [id number]     [id number]  ---------------------------------------------------------------------- Indications   [redacted] weeks gestation of pregnancy                Z3A.29   Pregnancy resulting from assisted              O09.819   reproductive technology (IVF)   Poor obstetric history: Prior fetal            O09.299   macrosomia, antepartum   History of cesarean delivery, currently        O34.219   pregnant   Poor obstetric history: Previous preterm       O09.219   delivery, antepartum @ 36wks   Umbilical vein abnormality complicating        O69.9XX0   pregnancy (UVV)   Pre-existing diabetes, type 1, in pregnancy,   O24.013   third trimester (insulin pump)  ---------------------------------------------------------------------- OB History  Gravidity:    4         Term:   1        Prem:   1  Living:       2 ---------------------------------------------------------------------- Fetal Evaluation  Num Of Fetuses:     1  Fetal Heart         124  Rate(bpm):  Cardiac Activity:   Observed  Presentation:       Cephalic  Placenta:           Anterior, above cervical os  P. Cord Insertion:  Visualized, central  Amniotic Fluid  AFI FV:      Subjectively within normal limits  AFI Sum(cm)     %Tile       Largest Pocket(cm)  17.82           67          6.84  RUQ(cm)       RLQ(cm)       LUQ(cm)        LLQ(cm)  6.84          1.05          6.14           3.79 ---------------------------------------------------------------------- Biophysical Evaluation  Amniotic F.V:   Within normal limits       F. Tone:        Observed  F. Movement:    Observed                   Score:          8/8  F. Breathing:   Observed  ---------------------------------------------------------------------- Gestational Age  Clinical EDD:  29w 5d                                        EDD:   08/16/17  Best:          29w 5d     Det. By:  Clinical EDD             EDD:   08/16/17 ---------------------------------------------------------------------- Anatomy  Abdomen:               Umbilical vein  varix (1.2) ---------------------------------------------------------------------- Impression  Indication: 32 yr old Z6X0960 at [redacted]w[redacted]d with type I diabetes,  previous preterm delivery, and fetus with an umbilical vein  varix for fetal ultrasound.  Findings:  1. Single intrauterine pregnancy with normal cardiac activity.  2. Anterior placenta without evidence of previa.  3. Normal amniotic fluid volume.  4. umbilical vein measures 12 mm and no filling defect is  seen, noting turbulent flow.  5. no evidence of hydrops  6. BPP 8/8 ---------------------------------------------------------------------- Recommendations  Continue twice weekly UVV surveillance and BPP's. ----------------------------------------------------------------------               Durwin Nora, MD Electronically Signed Final Report   06/05/2017 11:50 am ----------------------------------------------------------------------  [place] Mfm Fetal Bpp Wo Non Stress  Result Date: 06/02/2017 ----------------------------------------------------------------------  OBSTETRICS REPORT                       (Signed Final 06/02/2017 11:46 pm) ---------------------------------------------------------------------- Patient Info  ID #:       [id number]                          D.O.B.:  03/16/1985 (31 yrs)  Name:       Karen Alexander                Visit Date: 06/02/2017 02:43 pm ---------------------------------------------------------------------- Performed By  Performed By:     Hurman Horn          Secondary Phy.:    Ernestina Penna MD  Attending:        Particia Nearing MD       Address:           [address]                                                              [place], [place]                                                              [address]  Referred By:      Mila Merry           Location:  [facility] for                    [facility]  Ref. Address:     [address]                    [address] ---------------------------------------------------------------------- Orders   #  Description                                 Code   1  [place] MFM OB FOLLOW UP                         [phone]   2  [place] MFM FETAL BPP WO NON STRESS              [id number]  ----------------------------------------------------------------------   #  Ordered By               Order #        Accession #    Episode #   1  MARK NEWMAN              [id number]      [id number]     [id number]   2  MARK NEWMAN              [id number]      [id number]     [id number]  ---------------------------------------------------------------------- Indications   [redacted] weeks gestation of pregnancy                Z3A.29   Pre-existing diabetes, type 1, in pregnancy,   O24.012   second trimester (insulin pump)   Pregnancy resulting from assisted              O6.819   reproductive technology (IVF)   Poor obstetric history: Prior fetal            O09.299   macrosomia, antepartum   History of cesarean delivery, currently        O34.219   pregnant   Poor obstetric history: Previous preterm       O09.219   delivery, antepartum @ 36wks   Umbilical vein abnormality complicating        O69.9XX0   pregnancy (UVV)  ---------------------------------------------------------------------- OB History  Gravidity:    4         Term:   1        Prem:   1  Living:       2 ---------------------------------------------------------------------- Fetal Evaluation   Num Of Fetuses:     1  Fetal Heart         131  Rate(bpm):  Cardiac Activity:   Observed  Presentation:       Cephalic  Placenta:           Anterior, above cervical os  P. Cord Insertion:  Visualized, central  Amniotic Fluid  AFI FV:      Subjectively within normal limits  AFI Sum(cm)     %Tile       Largest Pocket(cm)  17.49           65          7.05  RUQ(cm)       RLQ(cm)       LUQ(cm)        LLQ(cm)  7.05          3.38          2.47           4.59 ---------------------------------------------------------------------- Biophysical Evaluation  Amniotic F.V:   Within normal limits       F. Tone:         Observed  F. Movement:    Observed                   Score:           8/8  F. Breathing:   Observed ---------------------------------------------------------------------- Biometry  BPD:      77.9  mm     G. Age:  31w 2d         91  %    CI:         74.49  %    70 - 86                                                          FL/HC:       18.2  %    19.6 - 20.8  HC:      286.5  mm     G. Age:  31w 3d         79  %    HC/AC:       1.02       0.99 - 1.21  AC:      279.9  mm     G. Age:  32w 0d       > 97  %    FL/BPD:      67.0  %    71 - 87  FL:       52.2  mm     G. Age:  27w 6d          7  %    FL/AC:       18.6  %    20 - 24  Est. FW:    1611   gm     3 lb 9 oz     76  % ---------------------------------------------------------------------- Gestational Age  Clinical EDD:  29w 2d                                        EDD:    08/16/17  U/S Today:     30w 5d                                        EDD:    08/06/17  Best:          29w 2d     Det. By:  Clinical EDD             EDD:    08/16/17 ---------------------------------------------------------------------- Anatomy  Cranium:               Appears normal         Aortic  Arch:            Previously seen  Cavum:                 Appears normal         Ductal Arch:            Previously seen  Ventricles:            Appears normal         Diaphragm:              Appears  normal  Choroid Plexus:        Previously seen        Stomach:                Appears normal, left                                                                        sided  Cerebellum:            Previously seen        Abdomen:                Umbilical vein                                                                        varix (1.0)  Posterior Fossa:       Previously seen        Abdominal Wall:         Previously seen  Nuchal Fold:           Previously seen        Cord Vessels:           Previously seen  Face:                  Orbits and profile     Kidneys:                Appear normal                         previously seen  Lips:                  Appears normal         Bladder:                Appears normal  Thoracic:              Appears normal         Spine:                  Previously seen  Heart:                 Appears normal         Upper Extremities:      Previously seen                         (  4CH, axis, and situs  RVOT:                  Appears normal         Lower Extremities:      Previously seen  LVOT:                  Appears normal  Other:  Female gender. Heels previously visualized. ---------------------------------------------------------------------- Impression  SIUP at 29+2 weeks with cardiac activity  UVV - no filling defects  Normal interval anatomy; anatomic survey complete  Normal amniotic fluid volume  Appropriate interval growth with EFW at the 76th %tile; AC >  97th %tile  BPP 8/8 ---------------------------------------------------------------------- Recommendations  Continue twice weekly UVV surveillance ----------------------------------------------------------------------                 Particia Nearing, MD Electronically Signed Final Report   06/02/2017 11:46 pm ----------------------------------------------------------------------  [place] Mfm Fetal Bpp Wo Non Stress  Result Date: 05/26/2017 ----------------------------------------------------------------------  OBSTETRICS  REPORT                      (Signed Final 05/26/2017 11:48 am) ---------------------------------------------------------------------- Patient Info  ID #:       [id number]                          D.O.B.:  1986-01-17 (31 yrs)  Name:       Karen Alexander                Visit Date: 05/26/2017 11:06 am ---------------------------------------------------------------------- Performed By  Performed By:     Eden Lathe BS      Secondary Phy.:   [id number] A                    RDMS RVT                                                             Macon Large MD  Attending:        Charlsie Merles MD         Address:          [address]                                                             [place], [place]                                                             [address]  Referred By:  Jerline Pain Creek           Location:         [facility] for                    [employer]  Ref. Address:     [address]                    [address] ---------------------------------------------------------------------- Orders   #  Description                                 Code   1  [place] MFM FETAL BPP WO NON STRESS              [phone]  ----------------------------------------------------------------------   #  Ordered By               Order #        Accession #    Episode #   1  Ledon Snare              [id number]      [id number]     [id number]  ---------------------------------------------------------------------- Indications   [redacted] weeks gestation of pregnancy                Z3A.28   Pre-existing diabetes, type 1, in pregnancy,   O24.012   second trimester (insulin pump)   Pregnancy resulting from assisted              O42.819   reproductive technology (IVF)   Poor obstetric history: Prior fetal            O09.299   macrosomia, antepartum   History of cesarean delivery, currently        O34.219   pregnant   Poor obstetric  history: Previous preterm       O09.219   delivery, antepartum @ 36wks   Umbilical vein abnormality complicating        O69.9XX0   pregnancy (UVV)  ---------------------------------------------------------------------- OB History  Gravidity:    4         Term:   1        Prem:   1  Living:       2 ---------------------------------------------------------------------- Fetal Evaluation  Num Of Fetuses:     1  Fetal Heart         144  Rate(bpm):  Cardiac Activity:   Observed  Presentation:       Cephalic  Amniotic Fluid  AFI FV:      Subjectively within normal limits  AFI Sum(cm)     %Tile       Largest Pocket(cm)  16.56           61          4.94  RUQ(cm)       RLQ(cm)       LUQ(cm)        LLQ(cm)  4.14          4.94          4.36           3.12  Comment:    UVV measures 11 mm .  No filling defect noted. ---------------------------------------------------------------------- Biophysical Evaluation  Amniotic F.V:   Within normal limits       F. Tone:        Observed  F. Movement:    Observed                   Score:          8/8  F. Breathing:   Observed ---------------------------------------------------------------------- Gestational Age  Clinical EDD:  28w 2d                                        EDD:   08/16/17  Best:          28w 2d     Det. By:  Clinical EDD             EDD:   08/16/17 ---------------------------------------------------------------------- Impression  Intrauterine pregnancy at 28+2 weeks with umbilical vein varix  Normal amniotic fluid  BPP 8/8  The varix measures 11mm and no filling defect/turbulent flow  is noted ---------------------------------------------------------------------- Recommendations  Recheck growth in 1 week  Begin Tuesday/Friday BPPs ----------------------------------------------------------------------                 Charlsie Merles, MD Electronically Signed Final Report   05/26/2017 11:48 am ----------------------------------------------------------------------  [place] Mfm Ob Follow  Up  Result Date: 06/02/2017 ----------------------------------------------------------------------  OBSTETRICS REPORT                       (Signed Final 06/02/2017 11:46 pm) ---------------------------------------------------------------------- Patient Info  ID #:       [id number]                          D.O.B.:  Nov 22, 1985 (31 yrs)  Name:       Karen Alexander                Visit Date: 06/02/2017 02:43 pm ---------------------------------------------------------------------- Performed By  Performed By:     Hurman Horn          Secondary Phy.:    Ernestina Penna MD  Attending:        Particia Nearing MD       Address:           [address]  [place], [place]                                                              [address]  Referred By:      Mila Merry           Location:          [facility] for                    [employer]  Ref. Address:     [address]                    [address] ---------------------------------------------------------------------- Orders   #  Description                                 Code   1  [place] MFM OB FOLLOW UP                         [phone]   2  [place] MFM FETAL BPP WO NON STRESS              [id number]  ----------------------------------------------------------------------   #  Ordered By               Order #        Accession #    Episode #   1  MARK NEWMAN              [id number]      [id number]     [id number]   2  MARK NEWMAN              [id number]      [id number]     [id number]  ---------------------------------------------------------------------- Indications   [redacted] weeks gestation of pregnancy                Z3A.29   Pre-existing diabetes, type 1, in pregnancy,   O24.012   second trimester (insulin pump)   Pregnancy  resulting from assisted              O87.819   reproductive technology (IVF)   Poor obstetric history: Prior fetal            O09.299   macrosomia, antepartum   History of cesarean delivery, currently        O34.219   pregnant   Poor obstetric history: Previous preterm       O09.219   delivery, antepartum @ 36wks   Umbilical vein abnormality complicating        O69.9XX0   pregnancy (UVV)  ---------------------------------------------------------------------- OB History  Gravidity:    4         Term:   1        Prem:   1  Living:       2 ---------------------------------------------------------------------- Fetal Evaluation  Num Of Fetuses:     1  Fetal Heart         131  Rate(bpm):  Cardiac Activity:   Observed  Presentation:       Cephalic  Placenta:           Anterior, above cervical os  P. Cord Insertion:  Visualized, central  Amniotic Fluid  AFI FV:      Subjectively within normal limits  AFI Sum(cm)     %Tile       Largest Pocket(cm)  17.49           65          7.05  RUQ(cm)       RLQ(cm)       LUQ(cm)        LLQ(cm)  7.05          3.38          2.47           4.59 ---------------------------------------------------------------------- Biophysical Evaluation  Amniotic F.V:   Within normal limits       F. Tone:         Observed  F. Movement:    Observed                   Score:           8/8  F. Breathing:   Observed ---------------------------------------------------------------------- Biometry  BPD:      77.9  mm     G. Age:  31w 2d         91  %    CI:         74.49  %    70 - 86                                                          FL/HC:       18.2  %    19.6 - 20.8  HC:      286.5  mm     G. Age:  31w 3d         79  %    HC/AC:       1.02       0.99 - 1.21  AC:      279.9  mm     G. Age:  32w 0d       > 97  %    FL/BPD:      67.0  %    71 - 87  FL:       52.2  mm     G. Age:  27w 6d          7  %    FL/AC:       18.6  %    20 - 24  Est. FW:    1611   gm     3 lb 9 oz     76  %  ---------------------------------------------------------------------- Gestational Age  Clinical EDD:  29w 2d                                        EDD:    08/16/17  U/S Today:     30w 5d  EDD:    08/06/17  Best:          29w 2d     Det. By:  Clinical EDD             EDD:    08/16/17 ---------------------------------------------------------------------- Anatomy  Cranium:               Appears normal         Aortic Arch:            Previously seen  Cavum:                 Appears normal         Ductal Arch:            Previously seen  Ventricles:            Appears normal         Diaphragm:              Appears normal  Choroid Plexus:        Previously seen        Stomach:                Appears normal, left                                                                        sided  Cerebellum:            Previously seen        Abdomen:                Umbilical vein                                                                        varix (1.0)  Posterior Fossa:       Previously seen        Abdominal Wall:         Previously seen  Nuchal Fold:           Previously seen        Cord Vessels:           Previously seen  Face:                  Orbits and profile     Kidneys:                Appear normal                         previously seen  Lips:                  Appears normal         Bladder:                Appears normal  Thoracic:              Appears normal  Spine:                  Previously seen  Heart:                 Appears normal         Upper Extremities:      Previously seen                         (4CH, axis, and situs  RVOT:                  Appears normal         Lower Extremities:      Previously seen  LVOT:                  Appears normal  Other:  Female gender. Heels previously visualized. ---------------------------------------------------------------------- Impression  SIUP at 29+2 weeks with cardiac activity  UVV - no filling defects  Normal interval  anatomy; anatomic survey complete  Normal amniotic fluid volume  Appropriate interval growth with EFW at the 76th %tile; AC >  97th %tile  BPP 8/8 ---------------------------------------------------------------------- Recommendations  Continue twice weekly UVV surveillance ----------------------------------------------------------------------                 Particia Nearing, MD Electronically Signed Final Report   06/02/2017 11:46 pm ----------------------------------------------------------------------  [place] Mfm Ob Limited  Result Date: 05/19/2017 ----------------------------------------------------------------------  OBSTETRICS REPORT                      (Signed Final 05/19/2017 04:26 pm) ---------------------------------------------------------------------- Patient Info  ID #:       [id number]                          D.O.B.:  03/08/1985 (31 yrs)  Name:       Karen Alexander                Visit Date: 05/19/2017 03:42 pm ---------------------------------------------------------------------- Performed By  Performed By:     Lenise Arena        Secondary Phy.:   Jethro Bastos                    RDMS                                                             Macon Large MD  Attending:        Durwin Nora       Address:          [address]                    MD                                                             Road  [place], [place]                                                             [address]  Referred By:      Mila Merry           Location:         [facility] for                    [employer]  Ref. Address:     [address]                    [address] ---------------------------------------------------------------------- Orders   #  Description                                 Code   1  [place] MFM OB LIMITED                           [id number]   ----------------------------------------------------------------------   #  Ordered By               Order #        Accession #    Episode #   1  Ledon Snare              [id number]      [id number]     [id number]  ---------------------------------------------------------------------- Indications   [redacted] weeks gestation of pregnancy                Z3A.27   Pre-existing diabetes, type 1, in pregnancy,   O24.012   second trimester (insulin pump)   Pregnancy resulting from assisted              O73.819   reproductive technology (IVF)   Poor obstetric history: Prior fetal            O09.299   macrosomia, antepartum   History of cesarean delivery, currently        O34.219   pregnant X 1   Poor obstetric history: Previous preterm       O09.219   delivery, antepartum @ 36wks   Umbilical vein abnormality complicating        O69.9XX0   pregnancy (UVV)  ---------------------------------------------------------------------- OB History  Gravidity:    4         Term:   1        Prem:   1  Living:       2 ---------------------------------------------------------------------- Fetal Evaluation  Num Of Fetuses:     1  Fetal Heart         143  Rate(bpm):  Cardiac Activity:   Observed  Amniotic Fluid  AFI FV:      Subjectively within normal limits ---------------------------------------------------------------------- Gestational Age  Clinical EDD:  27w 2d  EDD:   08/16/17  Best:          27w 2d     Det. By:  Clinical EDD             EDD:   08/16/17 ---------------------------------------------------------------------- Anatomy  Stomach:               Appears normal, left   Bladder:                Appears normal                         sided  Abdomen:               Appears normal ---------------------------------------------------------------------- Impression  Indication: 32 yr old [id number] at [redacted]w[redacted]d with type I diabetes,  previous preterm delivery, and fetus with an umbilical vein  varix for fetal  ultrasound.  Findings:  1. Single intrauterine pregnancy with normal cardiac activity.  2. Anterior placenta without evidence of previa.  3. Normal amniotic fluid volume.  4. umbilical vein measures 7-44mm and no filling defect is  seen.  technically today's images do not meet criteria for UVV  5. no evidence of hydrops ---------------------------------------------------------------------- Recommendations  A limited ultrasound/BPP was previously arranged for UVV.  If  UV remains normal, then surveillance from that point on can  revert to that required for DM (ie, monthly interval growth and  initiation of antenatal testing at 32 weeks). ----------------------------------------------------------------------               Durwin Nora, MD Electronically Signed Final Report   05/19/2017 04:26 pm ----------------------------------------------------------------------   Assessment and Plan:  Pregnancy: [id number] at [redacted]w[redacted]d  1. Umbilical vein abnormality affecting pregnancy, single or unspecified fetus Stable, continue twice a week BPP as per MFM. Delivery at 37 weeks.   2. Pre-existing type 1 diabetes mellitus during pregnancy in third trimester Managed by Endocrinologist.   3. Previous cesarean delivery, antepartum condition or complication Still deciding on mode of delivery. Will decide after next growth scan on 06/30/17.  4. Supervision of high risk pregnancy, antepartum Preterm labor symptoms and general obstetric precautions including but not limited to vaginal bleeding, contractions, leaking of fluid and fetal movement were reviewed in detail with the patient. Please refer to After Visit Summary for other counseling recommendations.  Return in about 2 weeks (around 06/30/2017) for OB Visit, also book other one 2 weeks after this..  Future Appointments  Date Time Provider Department Center  06/19/2017  7:45 AM WH-MFC [place] 2 WH-MFCUS M[facility]-US  06/23/2017 11:00 AM WH-MFC [place] 3 WH-MFCUS MFC-US  06/26/2017   7:45 AM WH-MFC [place] 2 WH-MFCUS M[facility]-US  06/30/2017  9:15 AM WH-MFC [place] 4 WH-MFCUS MFC-US  07/03/2017  7:45 AM WH-MFC [place] 2 WH-MFCUS MFC-US    Jaynie Collins, MD

## 2017-06-16 NOTE — Patient Instructions (Addendum)
Return to clinic for any scheduled appointments or obstetric concerns, or go to [facility] for evaluation  Trial of Labor After Cesarean Delivery A trial of labor after cesarean delivery (TOLAC) is when a woman tries to give birth vaginally after a previous cesarean delivery. TOLAC may be a safe and appropriate option for you depending on your medical history and other risk factors. When TOLAC is successful and you are able to have a vaginal delivery, this is called a vaginal birth after cesarean delivery (VBAC). Candidates for TOLAC TOLAC is possible for some women who:  Have undergone one or two prior cesarean deliveries in which the incision of the uterus was horizontal (low transverse).  Are carrying twins and have had one prior low transverse incision during a cesarean delivery.  Do not have a vertical (classical) uterine scar.  Have not had a tear in the wall of their uterus (uterine rupture).  TOLAC is also supported for women who meet appropriate criteria and:  Are under the age of 40 years.  Are tall and have a body mass index (BMI) of less than 30.  Have an unknown uterine scar.  Give birth in a facility equipped to handle an emergency cesarean delivery. This team should be able to handle possible complications such as a uterine rupture.  Have thorough counseling about the benefits and risks of TOLAC.  Have discussed future pregnancy plans with their health care provider.  Plan to have several more pregnancies.  Most successful candidates for TOLAC:  Have had a successful vaginal delivery before or after their cesarean delivery.  Experience labor that begins naturally on or before the due date (40 weeks of gestation).  Do not have a very large (macrosomic) baby.  Had a prior cesarean delivery but are not currently experiencing factors that would prompt a cesarean delivery (such as a breech position).  Had only one prior cesarean delivery.  Had a prior cesarean delivery  that was performed early in labor and not after full cervical dilation. TOLAC may be most appropriate for women who meet the above guidelines and who plan to have more pregnancies. TOLAC is not recommended for home births. Least successful candidates for TOLAC:  Have an induced labor with an unfavorable cervix. An unfavorable cervix is when the cervix is not dilating enough (among other factors).  Have never had a vaginal delivery.  Have had more than two cesarean deliveries.  Have a pregnancy at more than 40 weeks of gestation.  Are pregnant with a baby with a suspected weight greater than 4,000 grams (8 pounds) and who have no prior history of a vaginal delivery.  Have closely spaced pregnancies. Suggested benefits of TOLAC  You may have a faster recovery time.  You may have a shorter stay in the hospital.  You may have less pain and fewer problems than with a cesarean delivery. Women who have a cesarean delivery have a higher chance of needing blood or getting a fever, an infection, or a blood clot in the legs. Suggested risks of TOLAC The highest risk of complications happens to women who attempt a TOLAC and fail. A failed TOLAC results in an unplanned cesarean delivery. Risks related to [facility] or repeat cesarean deliveries include:  Blood loss.  Infection.  Blood clot.  Injury to surrounding tissues or organs.  Having to remove the uterus (hysterectomy).  Potential problems with the placenta (such as placenta previa or placenta accreta) in future pregnancies.  Although very rare, the main concerns with  TOLAC are:  Rupture of the uterine scar from a past cesarean delivery.  Needing an emergency cesarean delivery.  Having a bad outcome for the baby (perinatal morbidity).  Where to find more information:  [employer] of Obstetricians and Gynec[employer]: www.acog.org  [employer]: www.midwife.org This information is not intended to  replace advice given to you by your health care provider. Make sure you discuss any questions you have with your health care provider. Document Released: 10/22/2010 Document Revised: 01/02/2016 Document Reviewed: 07/26/2012 Elsevier Interactive Patient Education  2018 [employer].   Vaginal Birth After Cesarean Delivery Vaginal birth after cesarean delivery (VBAC) is giving birth vaginally after previously delivering a baby by a cesarean. In the past, if a woman had a cesarean delivery, all births afterward would be done by cesarean delivery. This is no longer true. It can be safe for the mother to try a vaginal delivery after having a cesarean delivery. It is important to discuss VBAC with your health care provider early in the pregnancy so you can understand the risks, benefits, and options. It will give you time to decide what is best in your particular case. The final decision about whether to have a VBAC or repeat cesarean delivery should be between you and your health care provider. Any changes in your health or your baby's health during your pregnancy may make it necessary to change your initial decision about VBAC. Women who plan to have a VBAC should check with their health care provider to be sure that:  The previous cesarean delivery was done with a low transverse uterine cut (incision) (not a vertical classical incision).  The birth canal is big enough for the baby.  There were no other operations on the uterus.  An electronic fetal monitor (EFM) will be on at all times during labor.  An operating room will be available and ready in case an emergency cesarean delivery is needed.  A health care provider and surgical nursing staff will be available at all times during labor to be ready to do an emergency delivery cesarean if necessary.  An anesthesiologist will be present in case an emergency cesarean delivery is needed.  The nursery is prepared and has adequate personnel and  necessary equipment available to care for the baby in case of an emergency cesarean delivery. Benefits of VBAC  Shorter stay in the hospital.  Avoidance of risks associated with cesarean delivery, such as: ? Surgical complications, such as opening of the incision or hernia in the incision. ? Injury to other organs. ? Fever. This can occur if an infection develops after surgery. It can also occur as a reaction to the medicine given to make you numb during the surgery.  Less blood loss and need for blood transfusions.  Lower risk of blood clots and infection.  Shorter recovery.  Decreased risk for having to remove the uterus (hysterectomy).  Decreased risk for the placenta to completely or partially cover the opening of the uterus (placenta previa) with a future pregnancy.  Decrease risk in future labor and delivery. Risks of a VBAC  Tearing (rupture) of the uterus. This is occurs in less than 1% of VBACs. The risk of this happening is higher if: ? Steps are taken to begin the labor process (induce labor) or stimulate or strengthen contractions (augment labor). ? Medicine is used to soften (ripen) the cervix.  Having to remove the uterus (hysterectomy) if it ruptures. VBAC should not be done if:  The previous  cesarean delivery was done with a vertical (classical) or T-shaped incision or you do not know what kind of incision was made.  You had a ruptured uterus.  You have had certain types of surgery on your uterus, such as removal of uterine fibroids. Ask your health care provider about other types of surgeries that prevent you from having a VBAC.  You have certain medical or childbirth (obstetrical) problems.  There are problems with the baby.  You have had two previous cesarean deliveries and no vaginal deliveries. Other facts to know about VBAC:  It is safe to have an epidural anesthetic with VBAC.  It is safe to turn the baby from a breech position (attempt an external  cephalic version).  It is safe to try a VBAC with twins.  VBAC may not be successful if your baby weights 8.8 lb (4 kg) or more. However, weight predictions are not always accurate and should not be used alone to decide if VBAC is right for you.  There is an increased failure rate if the time between the cesarean delivery and VBAC is less than 19 months.  Your health care provider may advise against a VBAC if you have preeclampsia (high blood pressure, protein in the urine, and swelling of face and extremities).  VBAC is often successful if you previously gave birth vaginally.  VBAC is often successful when the labor starts spontaneously before the due date.  Delivering a baby through a VBAC is similar to having a normal spontaneous vaginal delivery. This information is not intended to replace advice given to you by your health care provider. Make sure you discuss any questions you have with your health care provider. Document Released: 07/27/2006 Document Revised: 07/12/2015 Document Reviewed: 09/02/2012 Elsevier Interactive Patient Education  [employer].

## 2017-06-17 LAB — POCT URINE QUALITATIVE DIPSTICK BLOOD: Blood, UA: NEGATIVE

## 2017-06-17 NOTE — Addendum Note (Signed)
Addended by: Cheree Ditto, Kainat Pizana A on: 06/17/2017 08:46 AM   Modules accepted: Orders

## 2017-06-19 ENCOUNTER — Encounter (HOSPITAL_COMMUNITY): Payer: Self-pay

## 2017-06-19 ENCOUNTER — Other Ambulatory Visit (HOSPITAL_COMMUNITY): Payer: Self-pay | Admitting: Obstetrics and Gynecology

## 2017-06-19 ENCOUNTER — Ambulatory Visit (HOSPITAL_COMMUNITY)
Admission: RE | Admit: 2017-06-19 | Discharge: 2017-06-19 | Disposition: A | Discharge status: Home / Self Care | Payer: 59 | Source: Ambulatory Visit | Attending: Obstetrics & Gynecology | Admitting: Obstetrics & Gynecology

## 2017-06-19 DIAGNOSIS — O24013 Pre-existing diabetes mellitus, type 1, in pregnancy, third trimester: Secondary | ICD-10-CM | POA: Insufficient documentation

## 2017-06-19 DIAGNOSIS — O09219 Supervision of pregnancy with history of pre-term labor, unspecified trimester: Secondary | ICD-10-CM

## 2017-06-19 DIAGNOSIS — Z3A31 31 weeks gestation of pregnancy: Secondary | ICD-10-CM

## 2017-06-19 DIAGNOSIS — O09899 Supervision of other high risk pregnancies, unspecified trimester: Secondary | ICD-10-CM

## 2017-06-19 DIAGNOSIS — O34219 Maternal care for unspecified type scar from previous cesarean delivery: Secondary | ICD-10-CM

## 2017-06-19 DIAGNOSIS — O09213 Supervision of pregnancy with history of pre-term labor, third trimester: Secondary | ICD-10-CM | POA: Insufficient documentation

## 2017-06-19 DIAGNOSIS — O358XX0 Maternal care for other (suspected) fetal abnormality and damage, not applicable or unspecified: Secondary | ICD-10-CM

## 2017-06-19 DIAGNOSIS — O09813 Supervision of pregnancy resulting from assisted reproductive technology, third trimester: Secondary | ICD-10-CM | POA: Diagnosis not present

## 2017-06-19 DIAGNOSIS — O09293 Supervision of pregnancy with other poor reproductive or obstetric history, third trimester: Secondary | ICD-10-CM | POA: Diagnosis not present

## 2017-06-23 ENCOUNTER — Other Ambulatory Visit (HOSPITAL_COMMUNITY): Payer: Self-pay | Admitting: Obstetrics and Gynecology

## 2017-06-23 ENCOUNTER — Encounter (HOSPITAL_COMMUNITY): Payer: Self-pay

## 2017-06-23 ENCOUNTER — Ambulatory Visit (HOSPITAL_COMMUNITY)
Admission: RE | Admit: 2017-06-23 | Discharge: 2017-06-23 | Disposition: A | Discharge status: Home / Self Care | Payer: 59 | Source: Ambulatory Visit | Attending: Obstetrics & Gynecology | Admitting: Obstetrics & Gynecology

## 2017-06-23 DIAGNOSIS — O09813 Supervision of pregnancy resulting from assisted reproductive technology, third trimester: Secondary | ICD-10-CM | POA: Diagnosis not present

## 2017-06-23 DIAGNOSIS — O09293 Supervision of pregnancy with other poor reproductive or obstetric history, third trimester: Secondary | ICD-10-CM | POA: Diagnosis not present

## 2017-06-23 DIAGNOSIS — Z362 Encounter for other antenatal screening follow-up: Secondary | ICD-10-CM

## 2017-06-23 DIAGNOSIS — Z3A32 32 weeks gestation of pregnancy: Secondary | ICD-10-CM

## 2017-06-23 DIAGNOSIS — O09213 Supervision of pregnancy with history of pre-term labor, third trimester: Secondary | ICD-10-CM | POA: Diagnosis not present

## 2017-06-23 DIAGNOSIS — O358XX0 Maternal care for other (suspected) fetal abnormality and damage, not applicable or unspecified: Secondary | ICD-10-CM | POA: Diagnosis not present

## 2017-06-23 DIAGNOSIS — O34219 Maternal care for unspecified type scar from previous cesarean delivery: Secondary | ICD-10-CM | POA: Diagnosis not present

## 2017-06-23 DIAGNOSIS — O24013 Pre-existing diabetes mellitus, type 1, in pregnancy, third trimester: Secondary | ICD-10-CM

## 2017-06-24 ENCOUNTER — Other Ambulatory Visit (HOSPITAL_COMMUNITY): Payer: Self-pay | Admitting: *Deleted

## 2017-06-24 DIAGNOSIS — IMO0001 Reserved for inherently not codable concepts without codable children: Secondary | ICD-10-CM

## 2017-06-25 ENCOUNTER — Encounter: Payer: Self-pay | Admitting: *Deleted

## 2017-06-26 ENCOUNTER — Other Ambulatory Visit (HOSPITAL_COMMUNITY): Payer: Self-pay | Admitting: Obstetrics and Gynecology

## 2017-06-26 ENCOUNTER — Ambulatory Visit (HOSPITAL_COMMUNITY)
Admission: RE | Admit: 2017-06-26 | Discharge: 2017-06-26 | Disposition: A | Discharge status: Home / Self Care | Payer: 59 | Source: Ambulatory Visit | Attending: Obstetrics & Gynecology | Admitting: Obstetrics & Gynecology

## 2017-06-26 ENCOUNTER — Encounter (HOSPITAL_COMMUNITY): Payer: Self-pay

## 2017-06-26 DIAGNOSIS — O24013 Pre-existing diabetes mellitus, type 1, in pregnancy, third trimester: Secondary | ICD-10-CM | POA: Diagnosis not present

## 2017-06-26 DIAGNOSIS — O358XX0 Maternal care for other (suspected) fetal abnormality and damage, not applicable or unspecified: Secondary | ICD-10-CM

## 2017-06-26 DIAGNOSIS — O09813 Supervision of pregnancy resulting from assisted reproductive technology, third trimester: Secondary | ICD-10-CM

## 2017-06-26 DIAGNOSIS — O09213 Supervision of pregnancy with history of pre-term labor, third trimester: Secondary | ICD-10-CM | POA: Diagnosis not present

## 2017-06-26 DIAGNOSIS — Z8751 Personal history of pre-term labor: Secondary | ICD-10-CM

## 2017-06-26 DIAGNOSIS — Z98891 History of uterine scar from previous surgery: Secondary | ICD-10-CM

## 2017-06-26 DIAGNOSIS — O09293 Supervision of pregnancy with other poor reproductive or obstetric history, third trimester: Secondary | ICD-10-CM | POA: Insufficient documentation

## 2017-06-26 DIAGNOSIS — O09299 Supervision of pregnancy with other poor reproductive or obstetric history, unspecified trimester: Secondary | ICD-10-CM

## 2017-06-26 DIAGNOSIS — Z3A32 32 weeks gestation of pregnancy: Secondary | ICD-10-CM | POA: Diagnosis not present

## 2017-06-26 DIAGNOSIS — O34219 Maternal care for unspecified type scar from previous cesarean delivery: Secondary | ICD-10-CM | POA: Insufficient documentation

## 2017-06-30 ENCOUNTER — Ambulatory Visit (HOSPITAL_COMMUNITY)
Admission: RE | Admit: 2017-06-30 | Discharge: 2017-06-30 | Disposition: A | Discharge status: Home / Self Care | Payer: 59 | Source: Ambulatory Visit | Attending: Obstetrics & Gynecology | Admitting: Obstetrics & Gynecology

## 2017-06-30 ENCOUNTER — Encounter (HOSPITAL_COMMUNITY): Payer: Self-pay

## 2017-06-30 ENCOUNTER — Encounter: Payer: Self-pay | Admitting: Obstetrics & Gynecology

## 2017-06-30 ENCOUNTER — Ambulatory Visit (INDEPENDENT_AMBULATORY_CARE_PROVIDER_SITE_OTHER): Payer: 59 | Admitting: Obstetrics & Gynecology

## 2017-06-30 VITALS — BP 134/90 | HR 56 | Wt 180.0 lb

## 2017-06-30 DIAGNOSIS — O09899 Supervision of other high risk pregnancies, unspecified trimester: Secondary | ICD-10-CM

## 2017-06-30 DIAGNOSIS — O0993 Supervision of high risk pregnancy, unspecified, third trimester: Secondary | ICD-10-CM

## 2017-06-30 DIAGNOSIS — Z3A33 33 weeks gestation of pregnancy: Secondary | ICD-10-CM | POA: Diagnosis not present

## 2017-06-30 DIAGNOSIS — O09213 Supervision of pregnancy with history of pre-term labor, third trimester: Secondary | ICD-10-CM | POA: Diagnosis not present

## 2017-06-30 DIAGNOSIS — O24019 Pre-existing diabetes mellitus, type 1, in pregnancy, unspecified trimester: Secondary | ICD-10-CM

## 2017-06-30 DIAGNOSIS — Z362 Encounter for other antenatal screening follow-up: Secondary | ICD-10-CM | POA: Insufficient documentation

## 2017-06-30 DIAGNOSIS — O09299 Supervision of pregnancy with other poor reproductive or obstetric history, unspecified trimester: Secondary | ICD-10-CM

## 2017-06-30 DIAGNOSIS — O09293 Supervision of pregnancy with other poor reproductive or obstetric history, third trimester: Secondary | ICD-10-CM

## 2017-06-30 DIAGNOSIS — O24013 Pre-existing diabetes mellitus, type 1, in pregnancy, third trimester: Secondary | ICD-10-CM | POA: Diagnosis not present

## 2017-06-30 DIAGNOSIS — O358XX0 Maternal care for other (suspected) fetal abnormality and damage, not applicable or unspecified: Secondary | ICD-10-CM

## 2017-06-30 DIAGNOSIS — E109 Type 1 diabetes mellitus without complications: Secondary | ICD-10-CM

## 2017-06-30 DIAGNOSIS — O09813 Supervision of pregnancy resulting from assisted reproductive technology, third trimester: Secondary | ICD-10-CM | POA: Diagnosis not present

## 2017-06-30 DIAGNOSIS — O0990 Supervision of high risk pregnancy, unspecified, unspecified trimester: Secondary | ICD-10-CM

## 2017-06-30 DIAGNOSIS — O34219 Maternal care for unspecified type scar from previous cesarean delivery: Secondary | ICD-10-CM | POA: Insufficient documentation

## 2017-06-30 DIAGNOSIS — IMO0001 Reserved for inherently not codable concepts without codable children: Secondary | ICD-10-CM

## 2017-06-30 DIAGNOSIS — O09219 Supervision of pregnancy with history of pre-term labor, unspecified trimester: Secondary | ICD-10-CM

## 2017-06-30 NOTE — Procedures (Signed)
Karen Alexander November 12, 1985 [redacted]w[redacted]d  Fetus A Non-Stress Test Interpretation for 06/30/17  Indication: Unsatisfactory BPP  Fetal Heart Rate A Mode: External Baseline Rate (A): 130 bpm Variability: Moderate Accelerations: 15 x 15, 10 x 10 Decelerations: None Multiple birth?: No  Uterine Activity Mode: Palpation, Toco Contraction Frequency (min): 3-4 Contraction Duration (sec): 30-60 Contraction Quality: Mild Resting Tone Palpated: Relaxed Resting Time: Adequate  Interpretation (Fetal Testing) Nonstress Test Interpretation: Reactive Overall Impression: Reassuring for gestational age Comments: Reviewed tracing with Dr. Sherrie George

## 2017-06-30 NOTE — Progress Notes (Signed)
PRENATAL VISIT NOTE  Subjective:  Karen Alexander is a 32 y.o. B1Y7829 at [redacted]w[redacted]d being seen today for ongoing prenatal care.  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  Patient reports no complaints.  Contractions: Not present. Vag. Bleeding: None.  Movement: Present. Denies leaking of fluid.   The following portions of the patient's history were reviewed and updated as appropriate: allergies, current medications, past family history, past medical history, past social history, past surgical history and problem list. Problem list updated.  Objective:   Vitals:   06/30/17 1128  BP: 134/90  Pulse: (!) 56  Weight: 180 lb (81.6 kg)    Fetal Status: Fetal Heart Rate (bpm): 140   Movement: Present     General:  Alert, oriented and cooperative. Patient is in no acute distress.  Skin: Skin is warm and dry. No rash noted.   Cardiovascular: Normal heart rate noted  Respiratory: Normal respiratory effort, no problems with respiration noted  Abdomen: Soft, gravid, appropriate for gestational age.  Pain/Pressure: Absent     Pelvic: Cervical exam deferred        Extremities: Normal range of motion.  Edema: Mild pitting, slight indentation  Mental Status: Normal mood and affect. Normal behavior. Normal judgment and thought content.   Imaging [place] Mfm Fetal Bpp Wo Non Stress  Result Date:  06/30/2017 ----------------------------------------------------------------------  OBSTETRICS REPORT                      (Signed Final 06/30/2017 11:18 am) ---------------------------------------------------------------------- Patient Info  ID #:       [id number]                          D.O.B.:  11/20/1985 (31 yrs)  Name:       Karen Alexander                Visit Date: 06/30/2017 09:07 am ---------------------------------------------------------------------- Performed By  Performed By:     Hurman Horn          Secondary Phy.:   Jethro Bastos                    RDMS                                                             Macon Large MD  Attending:        Particia Nearing MD       Address:          [address]  [address]                                                             [place], [place]                                                             [address]  Referred By:      Mila Merry           Location:         [facility] for                    [facility]                    Healthcare  Ref. Address:     [address]                    [address] ---------------------------------------------------------------------- Orders   #  Description                                 Code   1  [place] MFM FETAL BPP WO NON STRESS              [phone]   2  [place] MFM OB FOLLOW UP                         [id number]  ----------------------------------------------------------------------   #  Ordered By               Order #        Accession #    Episode #   1  MARK NEWMAN              [id number]      [id number]     [id number]   2  MARK NEWMAN              [id number]      [id number]     [id number]  ---------------------------------------------------------------------- Indications   [redacted] weeks gestation of pregnancy                Z3A.33   Pregnancy resulting from assisted              O09.819   reproductive technology (IVF)   Poor obstetric history: Prior fetal             O09.299   macrosomia, antepartum; G2; 13+8   History of cesarean delivery, currently        O34.219   pregnant   Poor obstetric history: Previous preterm       O09.219   delivery, antepartum @ 36wks   Umbilical vein abnormality complicating        O69.9XX0   pregnancy (UVV)   Pre-existing diabetes, type 1, in pregnancy,   O24.013   third trimester (insulin pump)  ---------------------------------------------------------------------- OB History  Gravidity:    4         Term:   1        Prem:   1  Living:       2 ---------------------------------------------------------------------- Fetal Evaluation  Num Of Fetuses:     1  Fetal Heart         158  Rate(bpm):  Cardiac Activity:   Observed  Presentation:       Cephalic  Placenta:           Anterior, above cervical os  P. Cord Insertion:  Visualized  Amniotic Fluid  AFI FV:      Subjectively within normal limits  AFI Sum(cm)     %Tile       Largest Pocket(cm)  18.59           69          5.68  RUQ(cm)       RLQ(cm)       LUQ(cm)        LLQ(cm)  5.68          5.67          5.59           1.65 ---------------------------------------------------------------------- Biophysical Evaluation  Amniotic F.V:   Within normal limits       F. Tone:        Observed  F. Movement:    Not Observed               N.S.T:          Reactive  F. Breathing:   Observed                   Score:          8/10 ---------------------------------------------------------------------- Biometry  BPD:      89.7  mm     G. Age:  36w 2d         99  %    CI:        78.08   %    70 - 86                                                          FL/HC:      18.3   %    19.9 - 21.5  HC:      321.2  mm     G. Age:  36w 2d         85  %    HC/AC:      0.89        0.96 - 1.11  AC:      361.6  mm     G. Age:  40w 0d       > 97  %    FL/BPD:     65.4   %    71 - 87  FL:       58.7  mm     G. Age:  30w 5d        < 3  %    FL/AC:      16.2   %    20 - 24  Est. FW:    3150  gm    6 lb 15 oz    > 90  %  ----------------------------------------------------------------------  Gestational Age  U/S Today:     35w 6d                                        EDD:   07/29/17  Best:          33w 2d     Det. By:  D.O. Conception          EDD:   08/16/17 ---------------------------------------------------------------------- Anatomy  Cranium:               Appears normal         Aortic Arch:            Previously seen  Cavum:                 Appears normal         Ductal Arch:            Previously seen  Ventricles:            Previously seen        Diaphragm:              Appears normal  Choroid Plexus:        Previously seen        Stomach:                Appears normal, left                                                                        sided  Cerebellum:            Previously seen        Abdomen:                Umbilical vein                                                                        varix (1.5)  Posterior Fossa:       Previously seen        Abdominal Wall:         Previously seen  Nuchal Fold:           Previously seen        Cord Vessels:           Previously seen  Face:                  Orbits and profile     Kidneys:                Appear normal                         previously seen  Lips:                  Previously seen  Bladder:                Appears normal  Thoracic:              Appears normal         Spine:                  Previously seen  Heart:                 Appears normal         Upper Extremities:      Previously seen                         (4CH, axis, and situs  RVOT:                  Appears normal         Lower Extremities:      Previously seen  LVOT:                  Appears normal  Other:  Female gender. Heels previously visualized. ---------------------------------------------------------------------- Cervix Uterus Adnexa  Cervix  Not visualized (advanced GA >29wks)  Uterus  No abnormality visualized.  Left Ovary  Not visualized.  Right Ovary  Not visualized.  ---------------------------------------------------------------------- Impression  SIUP at 33+2 weeks  Cephalic presentation  UVV without filling defect identified  All other interval fetal anatomy was seen and appeared  normal; anatomic survey complete  Normal amniotic fluid volume  EFW > 90th %tile; AC > 97th %tile; 3150 grams; 6+15; fetus  at risk to be LGA/macrosomic  BPP 8/10 (-2 for gross body movements) ---------------------------------------------------------------------- Recommendations  Continue twice weekly UVV assessments ----------------------------------------------------------------------                 Particia Nearing, MD Electronically Signed Final Report   06/30/2017 11:18 am ----------------------------------------------------------------------  [place] Mfm Fetal Bpp Wo Non Stress  Result Date: 06/28/2017 ----------------------------------------------------------------------  OBSTETRICS REPORT                       (Signed Final 06/28/2017 10:38 pm) ---------------------------------------------------------------------- Patient Info  ID #:       [id number]                          D.O.B.:  01-20-1986 (31 yrs)  Name:       Karen Alexander                Visit Date: 06/26/2017 08:02 am ---------------------------------------------------------------------- Performed By  Performed By:     Lenise Arena        Secondary Phy.:    Jethro Bastos                    RDMS                                                              Macon Large MD  Attending:        Particia Nearing MD       Address:           [address]  [address]                                                              [place], [place]                                                              [address]  Referred By:      Mila Merry           Location:          [facility] for                    [facility]                    Healthcare  Ref. Address:     [address] ---------------------------------------------------------------------- Orders   #  Description                                 Code   1  [place] MFM FETAL BPP WO NON STRESS              [phone]  ----------------------------------------------------------------------   #  Ordered By               Order #        Accession #    Episode #   1  Charlsie Merles              [id number]      [id number]     [id number]  ---------------------------------------------------------------------- Indications   [redacted] weeks gestation of pregnancy                Z3A.32   Pregnancy resulting from assisted              O09.819   reproductive technology (IVF)   Poor obstetric history: Prior fetal            O09.299   macrosomia, antepartum   History of cesarean delivery, currently        O34.219   pregnant   Poor obstetric history: Previous preterm       O09.219   delivery, antepartum @ 36wks   Umbilical vein abnormality complicating        O69.9XX0   pregnancy (UVV)   Pre-existing diabetes, type 1, in pregnancy,   O24.013   third trimester (insulin pump)  ---------------------------------------------------------------------- OB History  Gravidity:    4         Term:   1        Prem:   1  Living:       2 ---------------------------------------------------------------------- Fetal Evaluation  Num Of Fetuses:     1  Fetal Heart         138  Rate(bpm):  Cardiac Activity:   Observed  Presentation:       Cephalic  Amniotic Fluid  AFI FV:      Subjectively within normal limits  AFI Sum(cm)     %Tile       Largest Pocket(cm)  19.2            72          6.29  RUQ(cm)       RLQ(cm)       LUQ(cm)        LLQ(cm)  6.29          6.11          3.39           3.41 ---------------------------------------------------------------------- Biophysical Evaluation  Amniotic F.V:   Within normal limits       F. Tone:         Observed  F. Movement:    Observed                   Score:           8/8  F. Breathing:   Observed  ---------------------------------------------------------------------- Gestational Age  Best:          32w 5d     Det. By:  D.O. Conception          EDD:    08/16/17 ---------------------------------------------------------------------- Anatomy  Thoracic:              Appears normal         Kidneys:                Appear normal  Stomach:               Appears normal, left   Bladder:                Appears normal                         sided ---------------------------------------------------------------------- Cervix Uterus Adnexa  Cervix  Not visualized (advanced GA >29wks) ---------------------------------------------------------------------- Impression  SIUP at 32+5 weeks  UVV; no filling defect identified  Normal amniotic fluid volume  BPP 8/8 ---------------------------------------------------------------------- Recommendations  Twice weekly assessment of UVV  Growth [place] next week ----------------------------------------------------------------------                 Particia Nearing, MD Electronically Signed Final Report   06/28/2017 10:38 pm ----------------------------------------------------------------------  [place] Mfm Fetal Bpp Wo Non Stress  Result Date: 06/23/2017 ----------------------------------------------------------------------  OBSTETRICS REPORT                      (Signed Final 06/23/2017 06:59 pm) ---------------------------------------------------------------------- Patient Info  ID #:       [id number]                          D.O.B.:  1985/02/24 (31 yrs)  Name:       Karen Alexander                Visit Date: 06/23/2017 11:12 am ---------------------------------------------------------------------- Performed By  Performed By:     Eden Lathe BS      Secondary Phy.:   Neka Bise A                    RDMS RVT  Macon Large MD  Attending:        Particia Nearing MD       Address:          [address]                                                             [place], [place]                                                             [address]  Referred By:      Mila Merry           Location:         [facility] for                    [facility]                    Healthcare  Ref. Address:     [address]                    [address] ---------------------------------------------------------------------- Orders   #  Description                                 Code   1  [place] MFM FETAL BPP WO NON STRESS              [phone]  ----------------------------------------------------------------------   #  Ordered By               Order #        Accession #    Episode #   1  Charlsie Merles              [id number]      [id number]     [id number]  ---------------------------------------------------------------------- Indications   [redacted] weeks gestation of pregnancy                Z3A.32   Pregnancy resulting from assisted              O09.819   reproductive technology (IVF)   Poor obstetric history: Prior fetal            O09.299   macrosomia, antepartum   History of cesarean delivery, currently        O34.219   pregnant   Poor obstetric history: Previous preterm       O09.219   delivery, antepartum @ 36wks   Umbilical vein abnormality complicating        O69.9XX0  pregnancy (UVV)   Pre-existing diabetes, type 1, in pregnancy,   O24.013   third trimester (insulin pump)  ---------------------------------------------------------------------- OB History  Gravidity:    4         Term:   1        Prem:   1  Living:       2 ---------------------------------------------------------------------- Fetal Evaluation  Num Of Fetuses:     1  Fetal Heart         133  Rate(bpm):  Cardiac Activity:   Observed  Presentation:       Cephalic  Amniotic Fluid  AFI FV:      Subjectively within normal limits  AFI Sum(cm)     %Tile       Largest Pocket(cm)  15.86           57          6.92  RUQ(cm)       RLQ(cm)        LUQ(cm)        LLQ(cm)  3.22          6.92          3              2.72  Comment:    UVV measures 13.3 mm. No filling defect visualized. ---------------------------------------------------------------------- Biophysical Evaluation  Amniotic F.V:   Within normal limits       F. Tone:        Observed  F. Movement:    Observed                   Score:          8/8  F. Breathing:   Observed ---------------------------------------------------------------------- Gestational Age  Best:          32w 2d     Det. By:  D.O. Conception          EDD:   08/16/17 ---------------------------------------------------------------------- Impression  SIUP at 32+2 weeks  UVV without filling defect identified  Normal amniotic fluid volume  BPP 8/8 ---------------------------------------------------------------------- Recommendations  Continue twice weekly assessments of UVV  Follow-up ultrasound for growth next week ----------------------------------------------------------------------                 Particia Nearing, MD Electronically Signed Final Report   06/23/2017 06:59 pm ----------------------------------------------------------------------  [place] Mfm Fetal Bpp Wo Non Stress  Result Date: 06/19/2017 ----------------------------------------------------------------------  OBSTETRICS REPORT                      (Signed Final 06/19/2017 08:49 am) ---------------------------------------------------------------------- Patient Info  ID #:       [id number]                          D.O.B.:  Mar 15, 1985 (31 yrs)  Name:       Karen Alexander                Visit Date: 06/19/2017 08:05 am ---------------------------------------------------------------------- Performed By  Performed By:     Tomma Lightning             Secondary Phy.:   Herma Uballe A                    RDMS,RVT  Macon Large MD  Attending:        Charlsie Merles MD         Address:          [address]                                                             [place], [place]                                                             [address]  Referred By:      Mila Merry           Location:         [facility] for                    [facility]                    Healthcare  Ref. Address:     [address]                    [address] ---------------------------------------------------------------------- Orders   #  Description                                 Code   1  [place] MFM FETAL BPP WO NON STRESS              [id number]  ----------------------------------------------------------------------   #  Ordered By               Order #        Accession #    Episode #   1  MARK Ezzard Standing              [id number]      [id number]     [id number]  ---------------------------------------------------------------------- Indications   [redacted] weeks gestation of pregnancy                Z3A.31   Pregnancy resulting from assisted              O09.819   reproductive technology (IVF)   Poor obstetric history: Prior fetal            O09.299   macrosomia, antepartum   History of cesarean delivery, currently        O34.219   pregnant   Poor obstetric history: Previous preterm       O09.219   delivery, antepartum @ 36wks   Umbilical vein abnormality complicating  O69.9XX0   pregnancy (UVV)   Pre-existing diabetes, type 1, in pregnancy,   O24.013   third trimester (insulin pump)  ---------------------------------------------------------------------- OB History  Gravidity:    4         Term:   1        Prem:   1  Living:       2 ---------------------------------------------------------------------- Fetal Evaluation  Num Of Fetuses:     1  Fetal Heart         153  Rate(bpm):  Cardiac Activity:   Observed  Presentation:       Breech  Placenta:           Anterior, above cervical os  Amniotic Fluid  AFI FV:      Subjectively within normal limits  AFI Sum(cm)     %Tile       Largest Pocket(cm)   13.41           42          6.87  RUQ(cm)       RLQ(cm)       LUQ(cm)        LLQ(cm)  3.32          1.85          6.87           1.37 ---------------------------------------------------------------------- Biophysical Evaluation  Amniotic F.V:   Within normal limits       F. Tone:        Observed  F. Movement:    Observed                   Score:          8/8  F. Breathing:   Observed ---------------------------------------------------------------------- Gestational Age  Best:          31w 5d     Det. By:  D.O. Conception          EDD:   08/16/17 ---------------------------------------------------------------------- Anatomy  Stomach:               Appears normal, left   Kidneys:                Appear normal                         sided  Abdomen:               Umbilical vein         Bladder:                Appears normal                         varix 1.1cm ---------------------------------------------------------------------- Cervix Uterus Adnexa  Cervix  Not visualized (advanced GA >29wks) ---------------------------------------------------------------------- Impression  Intrauterine pregnancy at 31+5 weeks with type 1 DM and a  umbilical vein varix  Normal amniotic fluid  Varix remains at 11mm with no turbulent flow  BPP 8/8 ---------------------------------------------------------------------- Recommendations  Continue twice weekly BPP and varix checks ----------------------------------------------------------------------                 Charlsie Merles, MD Electronically Signed Final Report   06/19/2017 08:49 am ----------------------------------------------------------------------  [place] Mfm Fetal Bpp Wo Non Stress  Result Date: 06/16/2017 ----------------------------------------------------------------------  OBSTETRICS REPORT                      (Signed  Final 06/16/2017 08:30 am) ---------------------------------------------------------------------- Patient Info  ID #:       [id number]                           D.O.B.:  [birth year] (31 yrs)  Name:       Karen Alexander                Visit Date: 06/16/2017 07:54 am ---------------------------------------------------------------------- Performed By  Performed By:     Gardenia Phlegm Phy.:   Yanina Knupp A                    Manley Mason MD  Attending:        Charlsie Merles MD         Address:          [address]                                                             [place], [place]                                                             [address]  Referred By:      Mila Merry           Location:         [facility] for                    [facility]                    Healthcare  Ref. Address:     [address]                    [address] ---------------------------------------------------------------------- Orders   #  Description                                 Code   1  [place] MFM FETAL BPP WO NON STRESS              [id number]  ----------------------------------------------------------------------   #  Ordered By               Order #        Accession #    Episode #   1  MARK Ezzard Standing              [id number]      [id number]     [id number]  ---------------------------------------------------------------------- Indications   [redacted] weeks gestation of pregnancy                Z3A.31   Pregnancy resulting from assisted              O09.819   reproductive technology (IVF)   Poor obstetric history: Prior fetal            O09.299   macrosomia, antepartum   History of cesarean delivery, currently        O34.219   pregnant   Poor obstetric history: Previous preterm       O09.219   delivery, antepartum @ 36wks   Umbilical vein abnormality complicating        O69.9XX0   pregnancy (UVV)   Pre-existing diabetes, type 1, in pregnancy,   O24.013   third trimester (insulin pump)   ---------------------------------------------------------------------- OB History  Gravidity:    4         Term:   1        Prem:   1  Living:       2 ---------------------------------------------------------------------- Fetal Evaluation  Num Of Fetuses:     1  Fetal Heart         128  Rate(bpm):  Cardiac Activity:   Observed  Presentation:       Cephalic  Amniotic Fluid  AFI FV:      Subjectively within normal limits  AFI Sum(cm)     %Tile       Largest Pocket(cm)  14.33           49          4.39  RUQ(cm)       RLQ(cm)       LUQ(cm)        LLQ(cm)  2.66          4.39          3.05           4.23 ---------------------------------------------------------------------- Biophysical Evaluation  Amniotic F.V:   Within normal limits       F. Tone:        Observed  F. Movement:    Observed                   Score:          8/8  F. Breathing:   Observed ---------------------------------------------------------------------- Gestational Age  Best:          31w 2d     Det. By:  D.O. Conception          EDD:   08/16/17 ---------------------------------------------------------------------- Anatomy  Abdomen:               Umbilical vein                         varix (1.0) ---------------------------------------------------------------------- Impression  Intrauterine pregnancy at 31+2 weeks with an umbilical vein  varix  Normal amniotic fluid  BPP 8/8  Varix stable at 10mm with no turbulent flow ---------------------------------------------------------------------- Recommendations  Continue twice-weekly varix evaluations and serial growth  scans ----------------------------------------------------------------------  Charlsie Merles, MD Electronically Signed Final Report   06/16/2017 08:30 am ----------------------------------------------------------------------  [place] Mfm Fetal Bpp Wo Non Stress  Result Date: 06/12/2017 ----------------------------------------------------------------------  OBSTETRICS REPORT                       (Signed Final 06/12/2017 06:48 pm) ---------------------------------------------------------------------- Patient Info  ID #:       [id number]                          D.O.B.:  07-01-1985 (31 yrs)  Name:       Karen Alexander                Visit Date: 06/12/2017 08:21 am ---------------------------------------------------------------------- Performed By  Performed By:     Tommi Emery         Secondary Phy.:   Jethro Bastos                    RDMS                                                             Macon Large MD  Attending:        Particia Nearing MD       Address:          [address]                                                             [place], [place]                                                             [address]  Referred By:      Mila Merry           Location:         [facility] for                    [facility]                    Healthcare  Ref. Address:     [address]                    [address] ---------------------------------------------------------------------- Orders   #  Description  Code   1  [place] MFM FETAL BPP WO NON STRESS              [id number]  ----------------------------------------------------------------------   #  Ordered By               Order #        Accession #    Episode #   1  MARK Ezzard Standing              [id number]      [id number]     [id number]  ---------------------------------------------------------------------- Indications   [redacted] weeks gestation of pregnancy                Z3A.30   Pregnancy resulting from assisted              O09.819   reproductive technology (IVF)   Poor obstetric history: Prior fetal            O09.299   macrosomia, antepartum   History of cesarean delivery, currently        O34.219   pregnant   Poor obstetric history: Previous preterm       O09.219   delivery, antepartum @ 36wks   Umbilical vein abnormality complicating         O69.9XX0   pregnancy (UVV)   Pre-existing diabetes, type 1, in pregnancy,   O24.013   third trimester (insulin pump)  ---------------------------------------------------------------------- OB History  Gravidity:    4         Term:   1        Prem:   1  Living:       2 ---------------------------------------------------------------------- Fetal Evaluation  Num Of Fetuses:     1  Fetal Heart         124  Rate(bpm):  Cardiac Activity:   Observed  Presentation:       Cephalic  Amniotic Fluid  AFI FV:      Subjectively within normal limits  AFI Sum(cm)     %Tile       Largest Pocket(cm)  19.71           76          6.54  RUQ(cm)       RLQ(cm)       LUQ(cm)        LLQ(cm)  5.45          2.65          5.07           6.54 ---------------------------------------------------------------------- Biophysical Evaluation  Amniotic F.V:   Within normal limits       F. Tone:        Observed  F. Movement:    Observed                   Score:          8/8  F. Breathing:   Observed ---------------------------------------------------------------------- Gestational Age  Best:          30w 5d     Det. By:  D.O. Conception          EDD:   08/16/17 ---------------------------------------------------------------------- Anatomy  Abdomen:               Umbilical vein                         varix 0.9cm ---------------------------------------------------------------------- Impression  SIUP at 30+5 weeks with cardiac activity  UVV - no filling defecs  Normal amniotic fluid volume  BPP 8/8 ---------------------------------------------------------------------- Recommendations  Continue twice weekly surveillance of UVV  Next growth [place]  05/14 ----------------------------------------------------------------------                 Particia Nearing, MD Electronically Signed Final Report   06/12/2017 06:48 pm ----------------------------------------------------------------------  [place] Mfm Fetal Bpp Wo Non Stress  Result Date:  06/09/2017 ----------------------------------------------------------------------  OBSTETRICS REPORT                      (Signed Final 06/09/2017 08:50 am) ---------------------------------------------------------------------- Patient Info  ID #:       [id number]                          D.O.B.:  [birth year] (31 yrs)  Name:       Karen Alexander                Visit Date: 06/09/2017 07:41 am ---------------------------------------------------------------------- Performed By  Performed By:     Vivien Rota        Secondary Phy.:   Ernestina Penna MD  Attending:        Darlyn Read MD         Address:          [address]                                                             [place], [place]                                                             [address]  Referred By:      Mila Merry           Location:         [facility] for                    [facility]                    Healthcare  Ref. Address:     [address] ---------------------------------------------------------------------- Orders   #  Description                                 Code   1  [place] MFM FETAL BPP WO NON STRESS              [phone]  ----------------------------------------------------------------------   #  Ordered By               Order #        Accession #    Episode #   1  Charlsie Merles              [id number]      [id number]     [id number]  ---------------------------------------------------------------------- Indications   [redacted] weeks gestation of pregnancy                Z3A.30   Pregnancy resulting from assisted              O09.819   reproductive technology (IVF)   Poor obstetric history: Prior fetal            O09.299   macrosomia, antepartum   History of cesarean delivery, currently        O34.219   pregnant   Poor obstetric  history: Previous preterm       O09.219   delivery, antepartum @ 36wks   Umbilical vein abnormality complicating        O69.9XX0   pregnancy (UVV)   Pre-existing diabetes, type 1, in pregnancy,   O24.013   third trimester (insulin pump)  ---------------------------------------------------------------------- OB History  Gravidity:    4         Term:   1        Prem:   1  Living:       2 ---------------------------------------------------------------------- Fetal Evaluation  Num Of Fetuses:     1  Fetal Heart         123  Rate(bpm):  Cardiac Activity:   Observed  Presentation:       Breech  Amniotic Fluid  AFI FV:      Subjectively within normal limits  AFI Sum(cm)     %Tile       Largest Pocket(cm)  18.9            72          6.78  RUQ(cm)       RLQ(cm)       LUQ(cm)        LLQ(cm)  4.54          6.78          4.25           3.33 ---------------------------------------------------------------------- Biophysical Evaluation  Amniotic F.V:   Pocket => 2 cm two         F. Tone:        Observed                  planes  F. Movement:    Observed                   Score:          8/8  F. Breathing:   Observed ---------------------------------------------------------------------- Gestational Age  Clinical EDD:  30w 2d  EDD:   08/16/17  Best:          30w 2d     Det. By:  Clinical EDD             EDD:   08/16/17 ---------------------------------------------------------------------- Anatomy  Abdomen:               Umbilical vein                         varix 1.1cm ---------------------------------------------------------------------- Comments  Normal fetal echo. ---------------------------------------------------------------------- Impression  Single living intrauterine pregnancy at 30w 2d.  Breech presentation.  Normal amniotic fluid volume.  BPP 8/8.  The umbilical vein measures 1.1cm without filling defect  visualized.  ---------------------------------------------------------------------- Recommendations  Continue twice weekly UVV surveillance and BPPs, serial [place]  for growth. ----------------------------------------------------------------------                   Darlyn Read, MD Electronically Signed Final Report   06/09/2017 08:50 am ----------------------------------------------------------------------  [place] Mfm Fetal Bpp Wo Non Stress  Result Date: 06/05/2017 ----------------------------------------------------------------------  OBSTETRICS REPORT                      (Signed Final 06/05/2017 11:50 am) ---------------------------------------------------------------------- Patient Info  ID #:       [id number]                          D.O.B.:  [birth year] (31 yrs)  Name:       Karen Alexander                Visit Date: 06/05/2017 11:20 am ---------------------------------------------------------------------- Performed By  Performed By:     Hurman Horn          Secondary Phy.:   Jethro Bastos                    RDMS                                                             Macon Large MD  Attending:        Durwin Nora       Address:          [address]                    MD                                                             [address]                                                             [place], [place]  [address]  Referred By:      Mila Merry           Location:         [facility] for                    [employer]  Ref. Address:     [address]                    [address] ---------------------------------------------------------------------- Orders   #  Description                                 Code   1  [place] MFM FETAL BPP WO NON STRESS              [phone]  ----------------------------------------------------------------------   #  Ordered By               Order #        Accession #    Episode #   1   Charlsie Merles              [id number]      [id number]     [id number]  ---------------------------------------------------------------------- Indications   [redacted] weeks gestation of pregnancy                Z3A.29   Pregnancy resulting from assisted              O09.819   reproductive technology (IVF)   Poor obstetric history: Prior fetal            O09.299   macrosomia, antepartum   History of cesarean delivery, currently        O34.219   pregnant   Poor obstetric history: Previous preterm       O09.219   delivery, antepartum @ 36wks   Umbilical vein abnormality complicating        O69.9XX0   pregnancy (UVV)   Pre-existing diabetes, type 1, in pregnancy,   O24.013   third trimester (insulin pump)  ---------------------------------------------------------------------- OB History  Gravidity:    4         Term:   1        Prem:   1  Living:       2 ---------------------------------------------------------------------- Fetal Evaluation  Num Of Fetuses:     1  Fetal Heart         124  Rate(bpm):  Cardiac Activity:   Observed  Presentation:       Cephalic  Placenta:           Anterior, above cervical os  P. Cord Insertion:  Visualized, central  Amniotic Fluid  AFI FV:      Subjectively within normal limits  AFI Sum(cm)     %Tile       Largest Pocket(cm)  17.82           67          6.84  RUQ(cm)       RLQ(cm)       LUQ(cm)        LLQ(cm)  6.84  1.05          6.14           3.79 ---------------------------------------------------------------------- Biophysical Evaluation  Amniotic F.V:   Within normal limits       F. Tone:        Observed  F. Movement:    Observed                   Score:          8/8  F. Breathing:   Observed ---------------------------------------------------------------------- Gestational Age  Clinical EDD:  29w 5d                                        EDD:   08/16/17  Best:          29w 5d     Det. By:  Clinical EDD             EDD:   08/16/17  ---------------------------------------------------------------------- Anatomy  Abdomen:               Umbilical vein                         varix (1.2) ---------------------------------------------------------------------- Impression  Indication: 31 yr old Z6X0960 at [redacted]w[redacted]d with type I diabetes,  previous preterm delivery, and fetus with an umbilical vein  varix for fetal ultrasound.  Findings:  1. Single intrauterine pregnancy with normal cardiac activity.  2. Anterior placenta without evidence of previa.  3. Normal amniotic fluid volume.  4. umbilical vein measures 12 mm and no filling defect is  seen, noting turbulent flow.  5. no evidence of hydrops  6. BPP 8/8 ---------------------------------------------------------------------- Recommendations  Continue twice weekly UVV surveillance and BPP's. ----------------------------------------------------------------------               Durwin Nora, MD Electronically Signed Final Report   06/05/2017 11:50 am ----------------------------------------------------------------------  [place] Mfm Fetal Bpp Wo Non Stress  Result Date: 06/02/2017 ----------------------------------------------------------------------  OBSTETRICS REPORT                       (Signed Final 06/02/2017 11:46 pm) ---------------------------------------------------------------------- Patient Info  ID #:       [id number]                          D.O.B.:  1985/06/11 (31 yrs)  Name:       Karen Alexander                Visit Date: 06/02/2017 02:43 pm ---------------------------------------------------------------------- Performed By  Performed By:     Hurman Horn          Secondary Phy.:    Ernestina Penna MD  Attending:        Particia Nearing MD       Address:  [address]                                                               [place], [place]                                                              [address]  Referred By:      Mila Merry           Location:          [facility] for                    [facility]                    Healthcare  Ref. Address:     [address]                    [address] ---------------------------------------------------------------------- Orders   #  Description                                 Code   1  [place] MFM OB FOLLOW UP                         [phone]   2  [place] MFM FETAL BPP WO NON STRESS              [id number]  ----------------------------------------------------------------------   #  Ordered By               Order #        Accession #    Episode #   1  MARK NEWMAN              [id number]      [id number]     [id number]   2  MARK NEWMAN              [id number]      [id number]     [id number]  ---------------------------------------------------------------------- Indications   [redacted] weeks gestation of pregnancy                Z3A.29   Pre-existing diabetes, type 1, in pregnancy,   O24.012   second trimester (insulin pump)   Pregnancy resulting from assisted              O71.819   reproductive technology (IVF)   Poor obstetric history: Prior fetal            O09.299  macrosomia, antepartum   History of cesarean delivery, currently        O34.219   pregnant   Poor obstetric history: Previous preterm       O09.219   delivery, antepartum @ 36wks   Umbilical vein abnormality complicating        O69.9XX0   pregnancy (UVV)  ---------------------------------------------------------------------- OB History  Gravidity:    4         Term:   1        Prem:   1  Living:       2 ---------------------------------------------------------------------- Fetal Evaluation  Num Of Fetuses:     1  Fetal Heart         131  Rate(bpm):  Cardiac Activity:   Observed  Presentation:       Cephalic  Placenta:           Anterior, above cervical os  P. Cord Insertion:  Visualized, central  Amniotic Fluid  AFI FV:       Subjectively within normal limits  AFI Sum(cm)     %Tile       Largest Pocket(cm)  17.49           65          7.05  RUQ(cm)       RLQ(cm)       LUQ(cm)        LLQ(cm)  7.05          3.38          2.47           4.59 ---------------------------------------------------------------------- Biophysical Evaluation  Amniotic F.V:   Within normal limits       F. Tone:         Observed  F. Movement:    Observed                   Score:           8/8  F. Breathing:   Observed ---------------------------------------------------------------------- Biometry  BPD:      77.9  mm     G. Age:  31w 2d         91  %    CI:         74.49  %    70 - 86                                                          FL/HC:       18.2  %    19.6 - 20.8  HC:      286.5  mm     G. Age:  31w 3d         79  %    HC/AC:       1.02       0.99 - 1.21  AC:      279.9  mm     G. Age:  32w 0d       > 97  %    FL/BPD:      67.0  %    71 - 87  FL:       52.2  mm     G. Age:  27w 6d          7  %  FL/AC:       18.6  %    20 - 24  Est. FW:    1611   gm     3 lb 9 oz     76  % ---------------------------------------------------------------------- Gestational Age  Clinical EDD:  29w 2d                                        EDD:    08/16/17  U/S Today:     30w 5d                                        EDD:    08/06/17  Best:          29w 2d     Det. By:  Clinical EDD             EDD:    08/16/17 ---------------------------------------------------------------------- Anatomy  Cranium:               Appears normal         Aortic Arch:            Previously seen  Cavum:                 Appears normal         Ductal Arch:            Previously seen  Ventricles:            Appears normal         Diaphragm:              Appears normal  Choroid Plexus:        Previously seen        Stomach:                Appears normal, left                                                                        sided  Cerebellum:            Previously seen        Abdomen:                 Umbilical vein                                                                        varix (1.0)  Posterior Fossa:       Previously seen        Abdominal Wall:         Previously seen  Nuchal Fold:           Previously seen        Cord Vessels:           Previously  seen  Face:                  Orbits and profile     Kidneys:                Appear normal                         previously seen  Lips:                  Appears normal         Bladder:                Appears normal  Thoracic:              Appears normal         Spine:                  Previously seen  Heart:                 Appears normal         Upper Extremities:      Previously seen                         (4CH, axis, and situs  RVOT:                  Appears normal         Lower Extremities:      Previously seen  LVOT:                  Appears normal  Other:  Female gender. Heels previously visualized. ---------------------------------------------------------------------- Impression  SIUP at 29+2 weeks with cardiac activity  UVV - no filling defects  Normal interval anatomy; anatomic survey complete  Normal amniotic fluid volume  Appropriate interval growth with EFW at the 76th %tile; AC >  97th %tile  BPP 8/8 ---------------------------------------------------------------------- Recommendations  Continue twice weekly UVV surveillance ----------------------------------------------------------------------                 Particia Nearing, MD Electronically Signed Final Report   06/02/2017 11:46 pm ----------------------------------------------------------------------  [place] Mfm Ob Follow Up  Result Date: 06/30/2017 ----------------------------------------------------------------------  OBSTETRICS REPORT                      (Signed Final 06/30/2017 11:18 am) ---------------------------------------------------------------------- Patient Info  ID #:       [id number]                          D.O.B.:  11/11/1985 (31 yrs)  Name:       Karen Alexander                 Visit Date: 06/30/2017 09:07 am ---------------------------------------------------------------------- Performed By  Performed By:     Hurman Horn          Secondary Phy.:   Sabria Florido A                    RDMS  Macon Large MD  Attending:        Particia Nearing MD       Address:          [address]                                                             [place], [place]                                                             [address]  Referred By:      Mila Merry           Location:         [facility] for                    [facility]                    Healthcare  Ref. Address:     [address]                    [address] ---------------------------------------------------------------------- Orders   #  Description                                 Code   1  [place] MFM FETAL BPP WO NON STRESS              [id number]   2  [place] MFM OB FOLLOW UP                         [id number]  ----------------------------------------------------------------------   #  Ordered By               Order #        Accession #    Episode #   1  MARK NEWMAN              [id number]      [id number]     [id number]   2  MARK NEWMAN              [id number]      [id number]     [id number]  ---------------------------------------------------------------------- Indications   [redacted] weeks gestation of pregnancy                Z3A.33   Pregnancy resulting from assisted              O09.819   reproductive technology (IVF)   Poor obstetric history: Prior fetal  O09.299   macrosomia, antepartum; G2; 13+8   History of cesarean delivery, currently        O34.219   pregnant   Poor obstetric history: Previous preterm       O09.219   delivery, antepartum @ 36wks   Umbilical vein abnormality complicating        O69.9XX0   pregnancy (UVV)   Pre-existing diabetes, type 1, in pregnancy,   O24.013   third  trimester (insulin pump)  ---------------------------------------------------------------------- OB History  Gravidity:    4         Term:   1        Prem:   1  Living:       2 ---------------------------------------------------------------------- Fetal Evaluation  Num Of Fetuses:     1  Fetal Heart         158  Rate(bpm):  Cardiac Activity:   Observed  Presentation:       Cephalic  Placenta:           Anterior, above cervical os  P. Cord Insertion:  Visualized  Amniotic Fluid  AFI FV:      Subjectively within normal limits  AFI Sum(cm)     %Tile       Largest Pocket(cm)  18.59           69          5.68  RUQ(cm)       RLQ(cm)       LUQ(cm)        LLQ(cm)  5.68          5.67          5.59           1.65 ---------------------------------------------------------------------- Biophysical Evaluation  Amniotic F.V:   Within normal limits       F. Tone:        Observed  F. Movement:    Not Observed               N.S.T:          Reactive  F. Breathing:   Observed                   Score:          8/10 ---------------------------------------------------------------------- Biometry  BPD:      89.7  mm     G. Age:  36w 2d         99  %    CI:        78.08   %    70 - 86                                                          FL/HC:      18.3   %    19.9 - 21.5  HC:      321.2  mm     G. Age:  36w 2d         85  %    HC/AC:      0.89        0.96 - 1.11  AC:      361.6  mm     G. Age:  40w 0d       > 97  %  FL/BPD:     65.4   %    71 - 87  FL:       58.7  mm     G. Age:  30w 5d        < 3  %    FL/AC:      16.2   %    20 - 24  Est. FW:    3150  gm    6 lb 15 oz    > 90  % ---------------------------------------------------------------------- Gestational Age  U/S Today:     35w 6d                                        EDD:   07/29/17  Best:          33w 2d     Det. By:  D.O. Conception          EDD:   08/16/17 ---------------------------------------------------------------------- Anatomy  Cranium:               Appears  normal         Aortic Arch:            Previously seen  Cavum:                 Appears normal         Ductal Arch:            Previously seen  Ventricles:            Previously seen        Diaphragm:              Appears normal  Choroid Plexus:        Previously seen        Stomach:                Appears normal, left                                                                        sided  Cerebellum:            Previously seen        Abdomen:                Umbilical vein                                                                        varix (1.5)  Posterior Fossa:       Previously seen        Abdominal Wall:         Previously seen  Nuchal Fold:           Previously seen        Cord Vessels:           Previously seen  Face:  Orbits and profile     Kidneys:                Appear normal                         previously seen  Lips:                  Previously seen        Bladder:                Appears normal  Thoracic:              Appears normal         Spine:                  Previously seen  Heart:                 Appears normal         Upper Extremities:      Previously seen                         (4CH, axis, and situs  RVOT:                  Appears normal         Lower Extremities:      Previously seen  LVOT:                  Appears normal  Other:  Female gender. Heels previously visualized. ---------------------------------------------------------------------- Cervix Uterus Adnexa  Cervix  Not visualized (advanced GA >29wks)  Uterus  No abnormality visualized.  Left Ovary  Not visualized.  Right Ovary  Not visualized. ---------------------------------------------------------------------- Impression  SIUP at 33+2 weeks  Cephalic presentation  UVV without filling defect identified  All other interval fetal anatomy was seen and appeared  normal; anatomic survey complete  Normal amniotic fluid volume  EFW > 90th %tile; AC > 97th %tile; 3150 grams; 6+15; fetus  at risk to be  LGA/macrosomic  BPP 8/10 (-2 for gross body movements) ---------------------------------------------------------------------- Recommendations  Continue twice weekly UVV assessments ----------------------------------------------------------------------                 Particia Nearing, MD Electronically Signed Final Report   06/30/2017 11:18 am ----------------------------------------------------------------------  [place] Mfm Ob Follow Up  Result Date: 06/02/2017 ----------------------------------------------------------------------  OBSTETRICS REPORT                       (Signed Final 06/02/2017 11:46 pm) ---------------------------------------------------------------------- Patient Info  ID #:       [id number]                          D.O.B.:  [birth year] (31 yrs)  Name:       Karen Alexander                Visit Date: 06/02/2017 02:43 pm ---------------------------------------------------------------------- Performed By  Performed By:     Hurman Horn          Secondary Phy.:    Labresha Mellor A                    RDMS  Macon Large MD  Attending:        Particia Nearing MD       Address:           [address]                                                              [place], [place]                                                              [address]  Referred By:      Mila Merry           Location:          [facility] for                    [facility]                    Healthcare  Ref. Address:     [address]                    [address] ---------------------------------------------------------------------- Orders   #  Description                                 Code   1  [place] MFM OB FOLLOW UP                         [id number]   2  [place] MFM FETAL BPP WO NON STRESS              [id number]  ----------------------------------------------------------------------   #  Ordered By                Order #        Accession #    Episode #   1  MARK NEWMAN              [id number]      [id number]     [id number]   2  MARK NEWMAN              [id number]      [id number]     [id number]  ---------------------------------------------------------------------- Indications   [redacted] weeks gestation of pregnancy                Z3A.29   Pre-existing diabetes, type 1, in pregnancy,   O24.012   second trimester (insulin pump)   Pregnancy resulting from assisted  O21.819   reproductive technology (IVF)   Poor obstetric history: Prior fetal            O09.299   macrosomia, antepartum   History of cesarean delivery, currently        O34.219   pregnant   Poor obstetric history: Previous preterm       O09.219   delivery, antepartum @ 36wks   Umbilical vein abnormality complicating        O69.9XX0   pregnancy (UVV)  ---------------------------------------------------------------------- OB History  Gravidity:    4         Term:   1        Prem:   1  Living:       2 ---------------------------------------------------------------------- Fetal Evaluation  Num Of Fetuses:     1  Fetal Heart         131  Rate(bpm):  Cardiac Activity:   Observed  Presentation:       Cephalic  Placenta:           Anterior, above cervical os  P. Cord Insertion:  Visualized, central  Amniotic Fluid  AFI FV:      Subjectively within normal limits  AFI Sum(cm)     %Tile       Largest Pocket(cm)  17.49           65          7.05  RUQ(cm)       RLQ(cm)       LUQ(cm)        LLQ(cm)  7.05          3.38          2.47           4.59 ---------------------------------------------------------------------- Biophysical Evaluation  Amniotic F.V:   Within normal limits       F. Tone:         Observed  F. Movement:    Observed                   Score:           8/8  F. Breathing:   Observed ---------------------------------------------------------------------- Biometry  BPD:      77.9  mm     G. Age:  31w 2d         91  %    CI:         74.49  %    70 - 86                                                           FL/HC:       18.2  %    19.6 - 20.8  HC:      286.5  mm     G. Age:  31w 3d         79  %    HC/AC:       1.02       0.99 - 1.21  AC:      279.9  mm     G. Age:  32w 0d       > 97  %    FL/BPD:      67.0  %    71 - 87  FL:  52.2  mm     G. Age:  27w 6d          7  %    FL/AC:       18.6  %    20 - 24  Est. FW:    1611   gm     3 lb 9 oz     76  % ---------------------------------------------------------------------- Gestational Age  Clinical EDD:  29w 2d                                        EDD:    08/16/17  U/S Today:     30w 5d                                        EDD:    08/06/17  Best:          29w 2d     Det. By:  Clinical EDD             EDD:    08/16/17 ---------------------------------------------------------------------- Anatomy  Cranium:               Appears normal         Aortic Arch:            Previously seen  Cavum:                 Appears normal         Ductal Arch:            Previously seen  Ventricles:            Appears normal         Diaphragm:              Appears normal  Choroid Plexus:        Previously seen        Stomach:                Appears normal, left                                                                        sided  Cerebellum:            Previously seen        Abdomen:                Umbilical vein                                                                        varix (1.0)  Posterior Fossa:       Previously seen        Abdominal Wall:         Previously seen  Nuchal Fold:  Previously seen        Cord Vessels:           Previously seen  Face:                  Orbits and profile     Kidneys:                Appear normal                         previously seen  Lips:                  Appears normal         Bladder:                Appears normal  Thoracic:              Appears normal         Spine:                  Previously seen  Heart:                 Appears normal         Upper Extremities:       Previously seen                         (4CH, axis, and situs  RVOT:                  Appears normal         Lower Extremities:      Previously seen  LVOT:                  Appears normal  Other:  Female gender. Heels previously visualized. ---------------------------------------------------------------------- Impression  SIUP at 29+2 weeks with cardiac activity  UVV - no filling defects  Normal interval anatomy; anatomic survey complete  Normal amniotic fluid volume  Appropriate interval growth with EFW at the 76th %tile; AC >  97th %tile  BPP 8/8 ---------------------------------------------------------------------- Recommendations  Continue twice weekly UVV surveillance ----------------------------------------------------------------------                 Particia Nearing, MD Electronically Signed Final Report   06/02/2017 11:46 pm ----------------------------------------------------------------------   Assessment and Plan:  Pregnancy: [id number] at [redacted]w[redacted]d  1. Pre-existing type 1 diabetes mellitus during pregnancy in third trimester 2. Prior fetal macrosomia, antepartum Last HgA1C was around 6; will see Endocrinologist this week. Continue to follow their recommendations as per her insulin pump dosing.  EFW >90%, AC>90% today.  Leaning towards repeat cesarean section but may reconsider if she goes into active labor. Follow up scan ordered.  - [place] MFM OB FOLLOW UP; Future  3. Umbilical vein abnormality affecting pregnancy, single or unspecified fetus No filling defects on ultrasound today, BPP 8/10 (-breathing). Continue 2x/week BPP as per MFM. Repeat cesarean section scheduling ordered for 07/27/17 ([redacted]w[redacted]d); she preferred 6/10 to 6/9.   - [place] MFM OB FOLLOW UP; Future  4. History of preterm delivery at 36 weeks, currently pregnant 5. Supervision of high risk pregnancy, antepartum Preterm labor symptoms and general obstetric precautions including but not limited to vaginal bleeding, contractions, leaking of  fluid and fetal movement were reviewed in detail with the patient. Please refer to After Visit Summary for other counseling recommendations.  Return in about 2 weeks (around 07/14/2017) for OB Visit.  Future  Appointments  Date Time Provider Department Center  07/03/2017  7:45 AM WH-MFC [place] 2 WH-MFCUS MFC-US  07/07/2017  9:15 AM WH-MFC [place] 4 WH-MFCUS MFC-US  07/10/2017  7:45 AM WH-MFC [place] 2 WH-MFCUS MFC-US  07/14/2017  8:15 AM Starling Jessie, Jethro Bastos, MD [facility]  07/14/2017 10:00 AM WH-MFC [place] 3 WH-MFCUS MFC-US  07/17/2017  9:15 AM WH-MFC [place] 4 WH-MFCUS MFC-US  07/21/2017  8:15 AM WH-MFC [place] 4 WH-MFCUS MFC-US  07/24/2017  7:45 AM WH-MFC [place] 2 WH-MFCUS MFC-US    Jaynie Collins, MD

## 2017-06-30 NOTE — Patient Instructions (Signed)
Return to clinic for any scheduled appointments or obstetric concerns, or go to [facility] for evaluation  

## 2017-07-01 ENCOUNTER — Other Ambulatory Visit: Payer: Self-pay | Admitting: *Deleted

## 2017-07-01 DIAGNOSIS — O24012 Pre-existing diabetes mellitus, type 1, in pregnancy, second trimester: Secondary | ICD-10-CM | POA: Diagnosis not present

## 2017-07-01 NOTE — Patient Outreach (Signed)
[facility]) Care Management  07/01/2017  Karen Alexander [birth year] [id number]   Received the following e-mail from [place] as an update on her self management of Type I diabetes during her pregnancy and as per the [facility] Gestational ( or preexisting diabetes ) Diabetes Program guidelines.  Marylu Lund,  I just wanted to follow up with you. I saw my OB- Dr. Macon Large yesterday. Her note will be in available in EPIC. Right now, we have tentatively planned for a c section on 07-27-17. I will have another growth ultrasound on 07-24-17 so my plans for delivery may change at that time based upon those results.   I had labs drawn for my A1C check through [facility] this morning-results pending. I follow up with my endocrinologist on Friday, 07-03-17. My sugars have been better the last 3 weeks than they were previously.   On the home stretch now! Nicholes Mango, CMA [facility]  [facility] [address] [place], [place] [address] Direct Dial: [phone]  Fax: [phone]   Sent reply e-mail to Marcelino Duster thanking her for the update.  Bary Richard RN,CCM,CDE [facility] Care Management Coordinator Office Phone [phone] Office Fax [phone]

## 2017-07-03 ENCOUNTER — Ambulatory Visit (HOSPITAL_COMMUNITY)
Admission: RE | Admit: 2017-07-03 | Discharge: 2017-07-03 | Disposition: A | Discharge status: Home / Self Care | Payer: 59 | Source: Ambulatory Visit | Attending: Obstetrics & Gynecology | Admitting: Obstetrics & Gynecology

## 2017-07-03 ENCOUNTER — Encounter (HOSPITAL_COMMUNITY): Payer: Self-pay

## 2017-07-03 DIAGNOSIS — O09813 Supervision of pregnancy resulting from assisted reproductive technology, third trimester: Secondary | ICD-10-CM | POA: Insufficient documentation

## 2017-07-03 DIAGNOSIS — O09899 Supervision of other high risk pregnancies, unspecified trimester: Secondary | ICD-10-CM

## 2017-07-03 DIAGNOSIS — O34219 Maternal care for unspecified type scar from previous cesarean delivery: Secondary | ICD-10-CM | POA: Diagnosis not present

## 2017-07-03 DIAGNOSIS — Z3A33 33 weeks gestation of pregnancy: Secondary | ICD-10-CM | POA: Diagnosis not present

## 2017-07-03 DIAGNOSIS — Z9641 Presence of insulin pump (external) (internal): Secondary | ICD-10-CM | POA: Insufficient documentation

## 2017-07-03 DIAGNOSIS — O09213 Supervision of pregnancy with history of pre-term labor, third trimester: Secondary | ICD-10-CM | POA: Insufficient documentation

## 2017-07-03 DIAGNOSIS — O09293 Supervision of pregnancy with other poor reproductive or obstetric history, third trimester: Secondary | ICD-10-CM | POA: Insufficient documentation

## 2017-07-03 DIAGNOSIS — O09219 Supervision of pregnancy with history of pre-term labor, unspecified trimester: Principal | ICD-10-CM

## 2017-07-03 DIAGNOSIS — O358XX0 Maternal care for other (suspected) fetal abnormality and damage, not applicable or unspecified: Secondary | ICD-10-CM | POA: Insufficient documentation

## 2017-07-03 DIAGNOSIS — Z794 Long term (current) use of insulin: Secondary | ICD-10-CM | POA: Diagnosis not present

## 2017-07-03 DIAGNOSIS — O26899 Other specified pregnancy related conditions, unspecified trimester: Secondary | ICD-10-CM | POA: Insufficient documentation

## 2017-07-03 DIAGNOSIS — O24013 Pre-existing diabetes mellitus, type 1, in pregnancy, third trimester: Secondary | ICD-10-CM | POA: Insufficient documentation

## 2017-07-03 DIAGNOSIS — O24019 Pre-existing diabetes mellitus, type 1, in pregnancy, unspecified trimester: Secondary | ICD-10-CM

## 2017-07-03 DIAGNOSIS — O99891 Other specified diseases and conditions complicating pregnancy: Secondary | ICD-10-CM | POA: Insufficient documentation

## 2017-07-03 DIAGNOSIS — IMO0001 Reserved for inherently not codable concepts without codable children: Secondary | ICD-10-CM

## 2017-07-03 NOTE — Procedures (Signed)
Karen Alexander 1985-04-05 [redacted]w[redacted]d  Fetus A Non-Stress Test Interpretation for 07/03/17  Indication: Unsatisfactory BPP  Fetal Heart Rate A Mode: External Baseline Rate (A): 130 bpm Variability: Moderate Accelerations: 15 x 15 Decelerations: None Multiple birth?: No  Uterine Activity Mode: Palpation, Toco Contraction Frequency (min): U/I Contraction Duration (sec): 30-40 Contraction Quality: Mild Resting Tone Palpated: Relaxed Resting Time: Adequate  Interpretation (Fetal Testing) Nonstress Test Interpretation: Reactive Overall Impression: Reassuring for gestational age Comments: Reviewed tracing with Dr. Marjo Bicker

## 2017-07-06 ENCOUNTER — Other Ambulatory Visit: Payer: Self-pay | Admitting: *Deleted

## 2017-07-06 NOTE — Patient Outreach (Signed)
[facility]) Care Management  07/06/2017  Karen Alexander Apr 15, 1985 [id number]   Per Karen Alexander's e-mail of 5/15 she was to see her endocrinologist on 5/17 to assess her Type I DM management during pregnancy.  MD note reviewed in Care Everywhere; Karen Alexander's Hgb A1C was 6.8%.  Her endocrinologist, Dr. Tedd Alexander,  made the following notes: She was congratulated on her excellent adherence to blood glucose monitoring and use of pump. * Adjusted her pump settings today to target hyperglycemia. New settings: Basal rates 12 AM -> 1.15 units/hr 5 AM -> 1.3 units/hr 8 AM ->1.05 units/hr 8 PM -> 0.925 units/hr 24-hour total insulin = 25.95 units   Bolus settings Carbohydrate ratios: 12 AM 1:13, 10 AM 1:5.5, 4 PM 1:5 Sensitivity:12 AM 82, at 4 PM 62 Target 90-90  * Encouraged blood glucose monitoring 8 times daily.  * She will return for a follow up visit about 3-4 weeks post partum. Today she was provided with her pre-pregnancy insulin pump settings and advised to resort back to those settings after delivery.   Karen Alexander is tentatively  scheduled for a C section on 6/10 at [facility].  Will plan to call Karen Alexander within 72 hours of discharge from hospital to complete transition of care assessment.  Karen Richard RN,CCM,CDE [facility] Care Management Coordinator Office Phone [phone] Office Fax [phone]

## 2017-07-07 ENCOUNTER — Encounter (HOSPITAL_COMMUNITY): Payer: Self-pay

## 2017-07-07 ENCOUNTER — Ambulatory Visit (HOSPITAL_COMMUNITY)
Admission: RE | Admit: 2017-07-07 | Discharge: 2017-07-07 | Disposition: A | Discharge status: Home / Self Care | Payer: 59 | Source: Ambulatory Visit | Attending: Obstetrics & Gynecology | Admitting: Obstetrics & Gynecology

## 2017-07-07 DIAGNOSIS — E109 Type 1 diabetes mellitus without complications: Secondary | ICD-10-CM | POA: Insufficient documentation

## 2017-07-07 DIAGNOSIS — O09293 Supervision of pregnancy with other poor reproductive or obstetric history, third trimester: Secondary | ICD-10-CM | POA: Insufficient documentation

## 2017-07-07 DIAGNOSIS — O09213 Supervision of pregnancy with history of pre-term labor, third trimester: Secondary | ICD-10-CM | POA: Insufficient documentation

## 2017-07-07 DIAGNOSIS — O358XX0 Maternal care for other (suspected) fetal abnormality and damage, not applicable or unspecified: Secondary | ICD-10-CM | POA: Diagnosis not present

## 2017-07-07 DIAGNOSIS — IMO0001 Reserved for inherently not codable concepts without codable children: Secondary | ICD-10-CM

## 2017-07-07 DIAGNOSIS — O34219 Maternal care for unspecified type scar from previous cesarean delivery: Secondary | ICD-10-CM | POA: Diagnosis not present

## 2017-07-07 DIAGNOSIS — O09813 Supervision of pregnancy resulting from assisted reproductive technology, third trimester: Secondary | ICD-10-CM | POA: Insufficient documentation

## 2017-07-07 DIAGNOSIS — Z9641 Presence of insulin pump (external) (internal): Secondary | ICD-10-CM | POA: Insufficient documentation

## 2017-07-07 DIAGNOSIS — O24013 Pre-existing diabetes mellitus, type 1, in pregnancy, third trimester: Secondary | ICD-10-CM | POA: Insufficient documentation

## 2017-07-07 DIAGNOSIS — Z794 Long term (current) use of insulin: Secondary | ICD-10-CM | POA: Insufficient documentation

## 2017-07-07 DIAGNOSIS — Z3A34 34 weeks gestation of pregnancy: Secondary | ICD-10-CM | POA: Insufficient documentation

## 2017-07-10 ENCOUNTER — Other Ambulatory Visit (HOSPITAL_COMMUNITY): Payer: Self-pay | Admitting: Maternal and Fetal Medicine

## 2017-07-10 ENCOUNTER — Encounter (HOSPITAL_COMMUNITY): Payer: Self-pay

## 2017-07-10 ENCOUNTER — Ambulatory Visit (HOSPITAL_COMMUNITY)
Admission: RE | Admit: 2017-07-10 | Discharge: 2017-07-10 | Disposition: A | Discharge status: Home / Self Care | Payer: 59 | Source: Ambulatory Visit | Attending: Obstetrics & Gynecology | Admitting: Obstetrics & Gynecology

## 2017-07-10 DIAGNOSIS — Z3A34 34 weeks gestation of pregnancy: Secondary | ICD-10-CM | POA: Insufficient documentation

## 2017-07-10 DIAGNOSIS — Z98891 History of uterine scar from previous surgery: Secondary | ICD-10-CM

## 2017-07-10 DIAGNOSIS — O09293 Supervision of pregnancy with other poor reproductive or obstetric history, third trimester: Secondary | ICD-10-CM | POA: Insufficient documentation

## 2017-07-10 DIAGNOSIS — Z6791 Unspecified blood type, Rh negative: Secondary | ICD-10-CM | POA: Diagnosis not present

## 2017-07-10 DIAGNOSIS — O09213 Supervision of pregnancy with history of pre-term labor, third trimester: Secondary | ICD-10-CM | POA: Insufficient documentation

## 2017-07-10 DIAGNOSIS — O26899 Other specified pregnancy related conditions, unspecified trimester: Secondary | ICD-10-CM

## 2017-07-10 DIAGNOSIS — O09219 Supervision of pregnancy with history of pre-term labor, unspecified trimester: Secondary | ICD-10-CM

## 2017-07-10 DIAGNOSIS — O09813 Supervision of pregnancy resulting from assisted reproductive technology, third trimester: Secondary | ICD-10-CM | POA: Insufficient documentation

## 2017-07-10 DIAGNOSIS — O09299 Supervision of pregnancy with other poor reproductive or obstetric history, unspecified trimester: Secondary | ICD-10-CM

## 2017-07-10 DIAGNOSIS — O26893 Other specified pregnancy related conditions, third trimester: Secondary | ICD-10-CM | POA: Diagnosis not present

## 2017-07-10 DIAGNOSIS — O34219 Maternal care for unspecified type scar from previous cesarean delivery: Secondary | ICD-10-CM | POA: Diagnosis not present

## 2017-07-10 DIAGNOSIS — O24313 Unspecified pre-existing diabetes mellitus in pregnancy, third trimester: Secondary | ICD-10-CM | POA: Insufficient documentation

## 2017-07-10 DIAGNOSIS — O24013 Pre-existing diabetes mellitus, type 1, in pregnancy, third trimester: Secondary | ICD-10-CM | POA: Diagnosis not present

## 2017-07-10 DIAGNOSIS — IMO0001 Reserved for inherently not codable concepts without codable children: Secondary | ICD-10-CM

## 2017-07-10 DIAGNOSIS — O0990 Supervision of high risk pregnancy, unspecified, unspecified trimester: Secondary | ICD-10-CM

## 2017-07-14 ENCOUNTER — Ambulatory Visit (HOSPITAL_COMMUNITY)
Admission: RE | Admit: 2017-07-14 | Discharge: 2017-07-14 | Disposition: A | Discharge status: Home / Self Care | Payer: 59 | Source: Ambulatory Visit | Attending: Family Medicine | Admitting: Family Medicine

## 2017-07-14 ENCOUNTER — Encounter (HOSPITAL_COMMUNITY): Payer: Self-pay

## 2017-07-14 ENCOUNTER — Other Ambulatory Visit (HOSPITAL_COMMUNITY)
Admission: RE | Admit: 2017-07-14 | Discharge: 2017-07-14 | Disposition: A | Discharge status: Home / Self Care | Payer: 59 | Source: Ambulatory Visit | Attending: Obstetrics & Gynecology | Admitting: Obstetrics & Gynecology

## 2017-07-14 ENCOUNTER — Ambulatory Visit (INDEPENDENT_AMBULATORY_CARE_PROVIDER_SITE_OTHER): Payer: 59 | Admitting: Obstetrics & Gynecology

## 2017-07-14 ENCOUNTER — Other Ambulatory Visit (HOSPITAL_COMMUNITY): Payer: Self-pay | Admitting: Maternal and Fetal Medicine

## 2017-07-14 ENCOUNTER — Encounter: Payer: Self-pay | Admitting: Obstetrics & Gynecology

## 2017-07-14 VITALS — BP 129/80 | HR 72 | Wt 191.0 lb

## 2017-07-14 DIAGNOSIS — O09819 Supervision of pregnancy resulting from assisted reproductive technology, unspecified trimester: Secondary | ICD-10-CM | POA: Insufficient documentation

## 2017-07-14 DIAGNOSIS — Z3A35 35 weeks gestation of pregnancy: Secondary | ICD-10-CM

## 2017-07-14 DIAGNOSIS — O09299 Supervision of pregnancy with other poor reproductive or obstetric history, unspecified trimester: Secondary | ICD-10-CM

## 2017-07-14 DIAGNOSIS — O34219 Maternal care for unspecified type scar from previous cesarean delivery: Secondary | ICD-10-CM | POA: Diagnosis not present

## 2017-07-14 DIAGNOSIS — O0990 Supervision of high risk pregnancy, unspecified, unspecified trimester: Secondary | ICD-10-CM

## 2017-07-14 DIAGNOSIS — O358XX0 Maternal care for other (suspected) fetal abnormality and damage, not applicable or unspecified: Secondary | ICD-10-CM

## 2017-07-14 DIAGNOSIS — E139 Other specified diabetes mellitus without complications: Secondary | ICD-10-CM

## 2017-07-14 DIAGNOSIS — O09293 Supervision of pregnancy with other poor reproductive or obstetric history, third trimester: Secondary | ICD-10-CM | POA: Diagnosis not present

## 2017-07-14 DIAGNOSIS — IMO0001 Reserved for inherently not codable concepts without codable children: Secondary | ICD-10-CM

## 2017-07-14 DIAGNOSIS — O09219 Supervision of pregnancy with history of pre-term labor, unspecified trimester: Secondary | ICD-10-CM | POA: Diagnosis not present

## 2017-07-14 DIAGNOSIS — O24013 Pre-existing diabetes mellitus, type 1, in pregnancy, third trimester: Secondary | ICD-10-CM

## 2017-07-14 DIAGNOSIS — O26899 Other specified pregnancy related conditions, unspecified trimester: Secondary | ICD-10-CM

## 2017-07-14 DIAGNOSIS — Z6791 Unspecified blood type, Rh negative: Secondary | ICD-10-CM

## 2017-07-14 NOTE — Progress Notes (Signed)
PRENATAL VISIT NOTE  Subjective:  Karen Alexander is a 32 y.o. [id number] at [redacted]w[redacted]d being seen today for ongoing prenatal care.  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  Patient reports no complaints.   . Vag. Bleeding: None.  Movement: Present. Denies leaking of fluid.   The following portions of the patient's history were reviewed and updated as appropriate: allergies, current medications, past family history, past medical history, past social history, past surgical history and problem list. Problem list updated.  Objective:   Vitals:   07/14/17 0823  BP: 129/80  Pulse: 72  Weight: 191 lb (86.6 kg)    Fetal Status: Fetal Heart Rate (bpm): 140   Movement: Present     General:  Alert, oriented and cooperative. Patient is in no acute distress.  Skin: Skin is warm and dry. No rash noted.   Cardiovascular: Normal heart rate noted  Respiratory: Normal respiratory effort, no problems with respiration noted  Abdomen: Soft, gravid, appropriate for gestational age.  Pain/Pressure: Absent       Pelvic: Cervical exam performed Dilation: 1 Effacement (%): Thick Station: Ballotable  Extremities: Normal range of motion.  Edema: Trace  Mental Status: Normal mood and affect. Normal behavior. Normal judgment and thought content.   Imaging [place] Mfm Fetal Bpp Wo Non Stress  Result Date: 07/10/2017 ----------------------------------------------------------------------  OBSTETRICS REPORT                      (Signed Final 07/10/2017 09:09 am) ---------------------------------------------------------------------- Patient Info  ID #:       [id number]                          D.O.B.:  Aug 03, 1985 (31 yrs)  Name:       Nicholes Mango                Visit Date: 07/10/2017 07:54 am ---------------------------------------------------------------------- Performed By  Performed By:     Tomma Lightning             Secondary Phy.:   Jethro Bastos                    RDMS,RVT                                                             Macon Large MD  Attending:        Durwin Nora       Address:          [address]  MD                                                             [address]                                                             [place], [place]                                                             [address]  Referred By:      Mila Merry           Location:         [facility] for                    [facility]                    Healthcare  Ref. Address:     [address]                    [address] ---------------------------------------------------------------------- Orders   #  Description                                 Code   1  [place] MFM FETAL BPP WO NON STRESS              [phone]  ----------------------------------------------------------------------   #  Ordered By               Order #        Accession #    Episode #   1  Particia Nearing            [id number]      [id number]     [id number]  ----------------------------------------------------------------------  Indications   [redacted] weeks gestation of pregnancy                Z3A.34   Pregnancy resulting from assisted              O09.819   reproductive technology (IVF)   Poor obstetric history: Prior fetal            O09.299   macrosomia, antepartum; G2; 13+8   History of cesarean delivery, currently        O34.219   pregnant   Poor obstetric history: Previous preterm       O09.219   delivery, antepartum @ 36wks   Umbilical vein abnormality complicating        O69.9XX0   pregnancy (UVV)   Pre-existing diabetes, type 1, in pregnancy,   O24.013   third trimester (insulin pump)  ---------------------------------------------------------------------- OB History  Gravidity:    4  Term:   1        Prem:   1  Living:       2 ---------------------------------------------------------------------- Fetal Evaluation  Num Of Fetuses:     1  Fetal Heart         120  Rate(bpm):  Cardiac Activity:   Observed  Presentation:       Cephalic  Placenta:           Anterior, above cervical os  Amniotic Fluid  AFI FV:      Subjectively within normal limits  AFI Sum(cm)     %Tile       Largest Pocket(cm)  18.95           70          7.2  RUQ(cm)       RLQ(cm)       LUQ(cm)        LLQ(cm)  4.56          2.92          7.2            4.27 ---------------------------------------------------------------------- Biophysical Evaluation  Amniotic F.V:   Within normal limits       F. Tone:        Observed  F. Movement:    Observed                   Score:          8/8  F. Breathing:   Observed ---------------------------------------------------------------------- Gestational Age  Best:          34w 5d     Det. By:  D.O. Conception          EDD:   08/16/17 ---------------------------------------------------------------------- Anatomy  Stomach:               Appears normal, left   Bladder:                Appears normal                         sided  Abdomen:               Umbilical vein                         varix = 1.2cm  ---------------------------------------------------------------------- Cervix Uterus Adnexa  Cervix  Not visualized (advanced GA >29wks) ---------------------------------------------------------------------- Impression  Single living intrauterine pregnancy at 34w 5d.  Cephalic presentation.  Normal amniotic fluid volume.  UVV is again seen  no filling defect on color angiography  no hydrops  BPP 8/8. ---------------------------------------------------------------------- Recommendations  Continue twice weekly BPP/hydrops evaluations/UVV color  angiography.  Delivery by 37 weeks if not indicated in the interim. ----------------------------------------------------------------------               Durwin Nora, MD Electronically Signed Final Report   07/10/2017 09:09 am ----------------------------------------------------------------------  [place] Mfm Fetal Bpp Wo Non Stress  Result Date: 07/07/2017 ----------------------------------------------------------------------  OBSTETRICS REPORT                      (Signed Final 07/07/2017 10:13 am) ---------------------------------------------------------------------- Patient Info  ID #:       [id number]                          D.O.B.:  08-28-1985 (31 yrs)  Name:  Nicholes Mango                Visit Date: 07/07/2017 09:33 am ---------------------------------------------------------------------- Performed By  Performed By:     Hurman Horn          Secondary Phy.:   Jethro Bastos                    RDMS                                                             Macon Large MD  Attending:        Charlsie Merles MD         Address:          [address]                                                             [place], [place]                                                             [address]  Referred By:      Mila Merry           Location:         [facility] for                     [facility]                    Healthcare  Ref. Address:     [address]                    [address] ---------------------------------------------------------------------- Orders   #  Description                                 Code   1  [place] MFM FETAL BPP WO NON STRESS              [phone]  ----------------------------------------------------------------------   #  Ordered By               Order #        Accession #    Episode #   1  Particia Nearing            [id number]      [id number]  [id number]  ---------------------------------------------------------------------- Indications   [redacted] weeks gestation of pregnancy                Z3A.34   Pregnancy resulting from assisted              O09.819   reproductive technology (IVF)   Poor obstetric history: Prior fetal            O09.299   macrosomia, antepartum; G2; 13+8   History of cesarean delivery, currently        O34.219   pregnant   Poor obstetric history: Previous preterm       O09.219   delivery, antepartum @ 36wks   Umbilical vein abnormality complicating        O69.9XX0   pregnancy (UVV)   Pre-existing diabetes, type 1, in pregnancy,   O24.013   third trimester (insulin pump)  ---------------------------------------------------------------------- OB History  Gravidity:    4         Term:   1        Prem:   1  Living:       2 ---------------------------------------------------------------------- Fetal Evaluation  Num Of Fetuses:     1  Cardiac Activity:   Observed  Placenta:           Anterior, above cervical os  P. Cord Insertion:  Previously Visualized  Amniotic Fluid  AFI FV:      Subjectively within normal limits  AFI Sum(cm)     %Tile       Largest Pocket(cm)  12.81           40          4.98  RUQ(cm)       RLQ(cm)       LUQ(cm)        LLQ(cm)  4.37          0.84          2.62           4.98 ---------------------------------------------------------------------- Biophysical Evaluation  Amniotic F.V:   Within normal limits       F. Tone:        Observed  F.  Movement:    Observed                   Score:          8/8  F. Breathing:   Observed ---------------------------------------------------------------------- Gestational Age  Best:          34w 2d     Det. By:  D.O. Conception          EDD:   08/16/17 ---------------------------------------------------------------------- Anatomy  Abdomen:               Umbilical vein                         varix 1.2 ---------------------------------------------------------------------- Impression  Intrauterine pregnancy at 34+2 weeks with type 1 DM and a  umbilical vein varix  Normal amniotic fluid  Varix remains stable at 12 mm with no turbulent flow  BPP 8/8 ---------------------------------------------------------------------- Recommendations  Continue twice weekly UVV assessments and BPPs ----------------------------------------------------------------------                 Charlsie Merles, MD Electronically Signed Final Report   07/07/2017 10:13 am ----------------------------------------------------------------------  [place] Mfm Fetal Bpp Wo Non Stress  Result Date: 07/03/2017 ----------------------------------------------------------------------  OBSTETRICS REPORT                      (  Signed Final 07/03/2017 09:22 am) ---------------------------------------------------------------------- Patient Info  ID #:       [id number]                          D.O.B.:  10-04-1985 (31 yrs)  Name:       Nicholes Mango                Visit Date: 07/03/2017 08:36 am ---------------------------------------------------------------------- Performed By  Performed By:     Earley Brooke     Secondary Phy.:   Jethro Bastos                    BS, RDMS                                                             Macon Large MD  Attending:        Durwin Nora       Address:          [address]                    [place]                                                             [address]                                                             [place],  [place]                                                             [address]  Referred By:      Mila Merry           Location:         [facility] for                    [facility]                    Healthcare  Ref. Address:     [address]                    [address] ---------------------------------------------------------------------- Orders   #  Description                                 Code   1  [place] MFM FETAL BPP WO NON STRESS  47829.56  ----------------------------------------------------------------------   #  Ordered By               Order #        Accession #    Episode #   1  MARK Ezzard Standing              [id number]      [id number]     [id number]  ---------------------------------------------------------------------- Indications   [redacted] weeks gestation of pregnancy                Z3A.33   Pregnancy resulting from assisted              O09.819   reproductive technology (IVF)   Poor obstetric history: Prior fetal            O09.299   macrosomia, antepartum; G2; 13+8   History of cesarean delivery, currently        O34.219   pregnant   Poor obstetric history: Previous preterm       O09.219   delivery, antepartum @ 36wks   Umbilical vein abnormality complicating        O69.9XX0   pregnancy (UVV)   Pre-existing diabetes, type 1, in pregnancy,   O24.013   third trimester (insulin pump)  ---------------------------------------------------------------------- OB History  Gravidity:    4         Term:   1        Prem:   1  Living:       2 ---------------------------------------------------------------------- Fetal Evaluation  Num Of Fetuses:     1  Fetal Heart         151  Rate(bpm):  Cardiac Activity:   Observed  Presentation:       Cephalic  Amniotic Fluid  AFI FV:      Subjectively within normal limits  AFI Sum(cm)     %Tile       Largest Pocket(cm)  19.78           74          8.69  RUQ(cm)       RLQ(cm)       LUQ(cm)        LLQ(cm)  5.1           1.94          8.69           4.05  ---------------------------------------------------------------------- Biophysical Evaluation  Amniotic F.V:   Within normal limits       F. Tone:        Observed  F. Movement:    Not Observed               Score:          6/8  F. Breathing:   Observed ---------------------------------------------------------------------- Gestational Age  Best:          33w 5d     Det. By:  D.O. Conception          EDD:   08/16/17 ---------------------------------------------------------------------- Anatomy  Abdomen:               Umbilical vein                         varix (1.4) ---------------------------------------------------------------------- Impression  SIUP at 33+5 weeks  active fetus  UVV is again seen and measures 14 mm; no filling defect  identified  no evidence of hydrops  Normal amniotic fluid volume  BPP 8/10 ---------------------------------------------------------------------- Recommendations  Continue twice weekly BPP/hydrops evaluations/UVV color  angiography.  Delivery by 37 weeks if not indicated in the interim. ----------------------------------------------------------------------               Durwin Nora, MD Electronically Signed Final Report   07/03/2017 09:22 am ----------------------------------------------------------------------  [place] Mfm Fetal Bpp Wo Non Stress  Result Date: 06/30/2017 ----------------------------------------------------------------------  OBSTETRICS REPORT                      (Signed Final 06/30/2017 11:18 am) ---------------------------------------------------------------------- Patient Info  ID #:       [id number]                          D.O.B.:  09-23-1985 (31 yrs)  Name:       Nicholes Mango                Visit Date: 06/30/2017 09:07 am ---------------------------------------------------------------------- Performed By  Performed By:     Hurman Horn          Secondary Phy.:   Jethro Bastos                    RDMS                                                              Macon Large MD  Attending:        Particia Nearing MD       Address:          [address]                                                             [place], [place]                                                             [address]  Referred By:      Mila Merry           Location:         [facility] for                    [facility]                    Healthcare  Ref. Address:     [address]  [address] ---------------------------------------------------------------------- Orders   #  Description                                 Code   1  [place] MFM FETAL BPP WO NON STRESS              76819.01   2  [place] MFM OB FOLLOW UP                         [id number]  ----------------------------------------------------------------------   #  Ordered By               Order #        Accession #    Episode #   1  MARK NEWMAN              [id number]      [id number]     [id number]   2  MARK NEWMAN              [id number]      [id number]     [id number]  ---------------------------------------------------------------------- Indications   [redacted] weeks gestation of pregnancy                Z3A.33   Pregnancy resulting from assisted              O09.819   reproductive technology (IVF)   Poor obstetric history: Prior fetal            O09.299   macrosomia, antepartum; G2; 13+8   History of cesarean delivery, currently        O34.219   pregnant   Poor obstetric history: Previous preterm       O09.219   delivery, antepartum @ 36wks   Umbilical vein abnormality complicating        O69.9XX0   pregnancy (UVV)   Pre-existing diabetes, type 1, in pregnancy,   O24.013   third trimester (insulin pump)  ---------------------------------------------------------------------- OB History  Gravidity:    4         Term:   1        Prem:   1  Living:       2 ---------------------------------------------------------------------- Fetal Evaluation  Num Of Fetuses:      1  Fetal Heart         158  Rate(bpm):  Cardiac Activity:   Observed  Presentation:       Cephalic  Placenta:           Anterior, above cervical os  P. Cord Insertion:  Visualized  Amniotic Fluid  AFI FV:      Subjectively within normal limits  AFI Sum(cm)     %Tile       Largest Pocket(cm)  18.59           69          5.68  RUQ(cm)       RLQ(cm)       LUQ(cm)        LLQ(cm)  5.68          5.67          5.59           1.65 ---------------------------------------------------------------------- Biophysical Evaluation  Amniotic F.V:   Within normal limits       F. Tone:        Observed  F. Movement:    Not  Observed               N.S.T:          Reactive  F. Breathing:   Observed                   Score:          8/10 ---------------------------------------------------------------------- Biometry  BPD:      89.7  mm     G. Age:  36w 2d         99  %    CI:        78.08   %    70 - 86                                                          FL/HC:      18.3   %    19.9 - 21.5  HC:      321.2  mm     G. Age:  36w 2d         85  %    HC/AC:      0.89        0.96 - 1.11  AC:      361.6  mm     G. Age:  40w 0d       > 97  %    FL/BPD:     65.4   %    71 - 87  FL:       58.7  mm     G. Age:  30w 5d        < 3  %    FL/AC:      16.2   %    20 - 24  Est. FW:    3150  gm    6 lb 15 oz    > 90  % ---------------------------------------------------------------------- Gestational Age  U/S Today:     35w 6d                                        EDD:   07/29/17  Best:          33w 2d     Det. By:  D.O. Conception          EDD:   08/16/17 ---------------------------------------------------------------------- Anatomy  Cranium:               Appears normal         Aortic Arch:            Previously seen  Cavum:                 Appears normal         Ductal Arch:            Previously seen  Ventricles:            Previously seen        Diaphragm:              Appears normal  Choroid Plexus:        Previously seen        Stomach:  Appears normal, left                                                                        sided  Cerebellum:            Previously seen        Abdomen:                Umbilical vein                                                                        varix (1.5)  Posterior Fossa:       Previously seen        Abdominal Wall:         Previously seen  Nuchal Fold:           Previously seen        Cord Vessels:           Previously seen  Face:                  Orbits and profile     Kidneys:                Appear normal                         previously seen  Lips:                  Previously seen        Bladder:                Appears normal  Thoracic:              Appears normal         Spine:                  Previously seen  Heart:                 Appears normal         Upper Extremities:      Previously seen                         (4CH, axis, and situs  RVOT:                  Appears normal         Lower Extremities:      Previously seen  LVOT:                  Appears normal  Other:  Female gender. Heels previously visualized. ---------------------------------------------------------------------- Cervix Uterus Adnexa  Cervix  Not visualized (advanced GA >29wks)  Uterus  No abnormality visualized.  Left Ovary  Not visualized.  Right Ovary  Not visualized. ---------------------------------------------------------------------- Impression  SIUP at 33+2 weeks  Cephalic presentation  UVV without filling defect identified  All other interval fetal anatomy was  seen and appeared  normal; anatomic survey complete  Normal amniotic fluid volume  EFW > 90th %tile; AC > 97th %tile; 3150 grams; 6+15; fetus  at risk to be LGA/macrosomic  BPP 8/10 (-2 for gross body movements) ---------------------------------------------------------------------- Recommendations  Continue twice weekly UVV assessments ----------------------------------------------------------------------                 Particia Nearing, MD Electronically  Signed Final Report   06/30/2017 11:18 am ----------------------------------------------------------------------  [place] Mfm Fetal Bpp Wo Non Stress  Result Date: 06/28/2017 ----------------------------------------------------------------------  OBSTETRICS REPORT                       (Signed Final 06/28/2017 10:38 pm) ---------------------------------------------------------------------- Patient Info  ID #:       [id number]                          D.O.B.:  07-28-1985 (31 yrs)  Name:       Nicholes Mango                Visit Date: 06/26/2017 08:02 am ---------------------------------------------------------------------- Performed By  Performed By:     Lenise Arena        Secondary Phy.:    Jethro Bastos                    RDMS                                                              Macon Large MD  Attending:        Particia Nearing MD       Address:           [address]                                                              [place], [place]                                                              [address]  Referred By:      Mila Merry           Location:          [facility] for                    [facility]  Healthcare  Ref. Address:     [address]                    [address] ---------------------------------------------------------------------- Orders   #  Description                                 Code   1  [place] MFM FETAL BPP WO NON STRESS              [phone]  ----------------------------------------------------------------------   #  Ordered By               Order #        Accession #    Episode #   1  Charlsie Merles              [id number]      [id number]     [id number]  ---------------------------------------------------------------------- Indications   [redacted] weeks gestation of pregnancy                Z3A.32   Pregnancy resulting from assisted              O09.819   reproductive technology (IVF)    Poor obstetric history: Prior fetal            O09.299   macrosomia, antepartum   History of cesarean delivery, currently        O34.219   pregnant   Poor obstetric history: Previous preterm       O09.219   delivery, antepartum @ 36wks   Umbilical vein abnormality complicating        O69.9XX0   pregnancy (UVV)   Pre-existing diabetes, type 1, in pregnancy,   O24.013   third trimester (insulin pump)  ---------------------------------------------------------------------- OB History  Gravidity:    4         Term:   1        Prem:   1  Living:       2 ---------------------------------------------------------------------- Fetal Evaluation  Num Of Fetuses:     1  Fetal Heart         138  Rate(bpm):  Cardiac Activity:   Observed  Presentation:       Cephalic  Amniotic Fluid  AFI FV:      Subjectively within normal limits  AFI Sum(cm)     %Tile       Largest Pocket(cm)  19.2            72          6.29  RUQ(cm)       RLQ(cm)       LUQ(cm)        LLQ(cm)  6.29          6.11          3.39           3.41 ---------------------------------------------------------------------- Biophysical Evaluation  Amniotic F.V:   Within normal limits       F. Tone:         Observed  F. Movement:    Observed                   Score:           8/8  F. Breathing:   Observed ---------------------------------------------------------------------- Gestational Age  Best:          32w 5d     Det.  By:  D.O. Conception          EDD:    08/16/17 ---------------------------------------------------------------------- Anatomy  Thoracic:              Appears normal         Kidneys:                Appear normal  Stomach:               Appears normal, left   Bladder:                Appears normal                         sided ---------------------------------------------------------------------- Cervix Uterus Adnexa  Cervix  Not visualized (advanced GA >29wks) ---------------------------------------------------------------------- Impression  SIUP at 32+5  weeks  UVV; no filling defect identified  Normal amniotic fluid volume  BPP 8/8 ---------------------------------------------------------------------- Recommendations  Twice weekly assessment of UVV  Growth [place] next week ----------------------------------------------------------------------                 Particia Nearing, MD Electronically Signed Final Report   06/28/2017 10:38 pm ----------------------------------------------------------------------  [place] Mfm Fetal Bpp Wo Non Stress  Result Date: 06/23/2017 ----------------------------------------------------------------------  OBSTETRICS REPORT                      (Signed Final 06/23/2017 06:59 pm) ---------------------------------------------------------------------- Patient Info  ID #:       [id number]                          D.O.B.:  [birth year] (31 yrs)  Name:       Nicholes Mango                Visit Date: 06/23/2017 11:12 am ---------------------------------------------------------------------- Performed By  Performed By:     Eden Lathe BS      Secondary Phy.:   IONGEX A                    RDMS RVT                                                             Macon Large MD  Attending:        Particia Nearing MD       Address:          [address]                                                             [place], [place]  [address]  Referred By:      Mila Merry           Location:         [facility] for                    [employer]  Ref. Address:     [address]                    [address] ---------------------------------------------------------------------- Orders   #  Description                                 Code   1  [place] MFM FETAL BPP WO NON STRESS              [phone]  ----------------------------------------------------------------------   #  Ordered By                Order #        Accession #    Episode #   1  Charlsie Merles              [id number]      [id number]     [id number]  ---------------------------------------------------------------------- Indications   [redacted] weeks gestation of pregnancy                Z3A.32   Pregnancy resulting from assisted              O09.819   reproductive technology (IVF)   Poor obstetric history: Prior fetal            O09.299   macrosomia, antepartum   History of cesarean delivery, currently        O34.219   pregnant   Poor obstetric history: Previous preterm       O09.219   delivery, antepartum @ 36wks   Umbilical vein abnormality complicating        O69.9XX0   pregnancy (UVV)   Pre-existing diabetes, type 1, in pregnancy,   O24.013   third trimester (insulin pump)  ---------------------------------------------------------------------- OB History  Gravidity:    4         Term:   1        Prem:   1  Living:       2 ---------------------------------------------------------------------- Fetal Evaluation  Num Of Fetuses:     1  Fetal Heart         133  Rate(bpm):  Cardiac Activity:   Observed  Presentation:       Cephalic  Amniotic Fluid  AFI FV:      Subjectively within normal limits  AFI Sum(cm)     %Tile       Largest Pocket(cm)  15.86           57          6.92  RUQ(cm)       RLQ(cm)       LUQ(cm)        LLQ(cm)  3.22          6.92          3  2.72  Comment:    UVV measures 13.3 mm. No filling defect visualized. ---------------------------------------------------------------------- Biophysical Evaluation  Amniotic F.V:   Within normal limits       F. Tone:        Observed  F. Movement:    Observed                   Score:          8/8  F. Breathing:   Observed ---------------------------------------------------------------------- Gestational Age  Best:          32w 2d     Det. By:  D.O. Conception          EDD:   08/16/17 ---------------------------------------------------------------------- Impression  SIUP at 32+2 weeks  UVV without  filling defect identified  Normal amniotic fluid volume  BPP 8/8 ---------------------------------------------------------------------- Recommendations  Continue twice weekly assessments of UVV  Follow-up ultrasound for growth next week ----------------------------------------------------------------------                 Particia Nearing, MD Electronically Signed Final Report   06/23/2017 06:59 pm ----------------------------------------------------------------------  [place] Mfm Fetal Bpp Wo Non Stress  Result Date: 06/19/2017 ----------------------------------------------------------------------  OBSTETRICS REPORT                      (Signed Final 06/19/2017 08:49 am) ---------------------------------------------------------------------- Patient Info  ID #:       [id number]                          D.O.B.:  [birth year] (31 yrs)  Name:       Nicholes Mango                Visit Date: 06/19/2017 08:05 am ---------------------------------------------------------------------- Performed By  Performed By:     Tomma Lightning             Secondary Phy.:   Jethro Bastos                    RDMS,RVT                                                             Macon Large MD  Attending:        Charlsie Merles MD         Address:          [address]                                                             [place], [place]  [address]  Referred By:      Mila Merry           Location:         [facility] for                    [employer]  Ref. Address:     [address]                    [address] ---------------------------------------------------------------------- Orders   #  Description                                 Code   1  [place] MFM FETAL BPP WO NON STRESS              [phone]  ----------------------------------------------------------------------   #  Ordered By                Order #        Accession #    Episode #   1  MARK Ezzard Standing              [id number]      [id number]     [id number]  ---------------------------------------------------------------------- Indications   [redacted] weeks gestation of pregnancy                Z3A.31   Pregnancy resulting from assisted              O09.819   reproductive technology (IVF)   Poor obstetric history: Prior fetal            O09.299   macrosomia, antepartum   History of cesarean delivery, currently        O34.219   pregnant   Poor obstetric history: Previous preterm       O09.219   delivery, antepartum @ 36wks   Umbilical vein abnormality complicating        O69.9XX0   pregnancy (UVV)   Pre-existing diabetes, type 1, in pregnancy,   O24.013   third trimester (insulin pump)  ---------------------------------------------------------------------- OB History  Gravidity:    4         Term:   1        Prem:   1  Living:       2 ---------------------------------------------------------------------- Fetal Evaluation  Num Of Fetuses:     1  Fetal Heart         153  Rate(bpm):  Cardiac Activity:   Observed  Presentation:       Breech  Placenta:           Anterior, above cervical os  Amniotic Fluid  AFI FV:      Subjectively within normal limits  AFI Sum(cm)     %Tile       Largest Pocket(cm)  13.41           42          6.87  RUQ(cm)       RLQ(cm)       LUQ(cm)        LLQ(cm)  3.32          1.85  6.87           1.37 ---------------------------------------------------------------------- Biophysical Evaluation  Amniotic F.V:   Within normal limits       F. Tone:        Observed  F. Movement:    Observed                   Score:          8/8  F. Breathing:   Observed ---------------------------------------------------------------------- Gestational Age  Best:          31w 5d     Det. By:  D.O. Conception          EDD:   08/16/17 ---------------------------------------------------------------------- Anatomy  Stomach:               Appears normal,  left   Kidneys:                Appear normal                         sided  Abdomen:               Umbilical vein         Bladder:                Appears normal                         varix 1.1cm ---------------------------------------------------------------------- Cervix Uterus Adnexa  Cervix  Not visualized (advanced GA >29wks) ---------------------------------------------------------------------- Impression  Intrauterine pregnancy at 31+5 weeks with type 1 DM and a  umbilical vein varix  Normal amniotic fluid  Varix remains at 11mm with no turbulent flow  BPP 8/8 ---------------------------------------------------------------------- Recommendations  Continue twice weekly BPP and varix checks ----------------------------------------------------------------------                 Charlsie Merles, MD Electronically Signed Final Report   06/19/2017 08:49 am ----------------------------------------------------------------------  [place] Mfm Fetal Bpp Wo Non Stress  Result Date: 06/16/2017 ----------------------------------------------------------------------  OBSTETRICS REPORT                      (Signed Final 06/16/2017 08:30 am) ---------------------------------------------------------------------- Patient Info  ID #:       [id number]                          D.O.B.:  [birth year] (31 yrs)  Name:       Nicholes Mango                Visit Date: 06/16/2017 07:54 am ---------------------------------------------------------------------- Performed By  Performed By:     Gardenia Phlegm Phy.:   Ulysses Alper A                    Manley Mason MD  Attending:        Charlsie Merles MD         Address:  [address]                                                             [place], [place]                                                             [address]  Referred By:      Mila Merry           Location:         [facility] for                    [facility]                    Healthcare  Ref. Address:     [address]                    [address] ---------------------------------------------------------------------- Orders   #  Description                                 Code   1  [place] MFM FETAL BPP WO NON STRESS              [phone]  ----------------------------------------------------------------------   #  Ordered By               Order #        Accession #    Episode #   1  MARK Ezzard Standing              [id number]      [id number]     [id number]  ---------------------------------------------------------------------- Indications   [redacted] weeks gestation of pregnancy                Z3A.31   Pregnancy resulting from assisted              O09.819   reproductive technology (IVF)   Poor obstetric history: Prior fetal            O09.299   macrosomia, antepartum   History of cesarean delivery, currently        O34.219   pregnant   Poor obstetric history: Previous preterm       O09.219   delivery, antepartum @ 36wks   Umbilical vein abnormality complicating        O69.9XX0   pregnancy (UVV)   Pre-existing diabetes, type 1, in pregnancy,   O24.013   third trimester (insulin pump)  ---------------------------------------------------------------------- OB History  Gravidity:  4         Term:   1        Prem:   1  Living:       2 ---------------------------------------------------------------------- Fetal Evaluation  Num Of Fetuses:     1  Fetal Heart         128  Rate(bpm):  Cardiac Activity:   Observed  Presentation:       Cephalic  Amniotic Fluid  AFI FV:      Subjectively within normal limits  AFI Sum(cm)     %Tile       Largest Pocket(cm)  14.33           49          4.39  RUQ(cm)       RLQ(cm)       LUQ(cm)        LLQ(cm)  2.66          4.39          3.05           4.23 ---------------------------------------------------------------------- Biophysical Evaluation  Amniotic F.V:    Within normal limits       F. Tone:        Observed  F. Movement:    Observed                   Score:          8/8  F. Breathing:   Observed ---------------------------------------------------------------------- Gestational Age  Best:          31w 2d     Det. By:  D.O. Conception          EDD:   08/16/17 ---------------------------------------------------------------------- Anatomy  Abdomen:               Umbilical vein                         varix (1.0) ---------------------------------------------------------------------- Impression  Intrauterine pregnancy at 31+2 weeks with an umbilical vein  varix  Normal amniotic fluid  BPP 8/8  Varix stable at 10mm with no turbulent flow ---------------------------------------------------------------------- Recommendations  Continue twice-weekly varix evaluations and serial growth  scans ----------------------------------------------------------------------                 Charlsie Merles, MD Electronically Signed Final Report   06/16/2017 08:30 am ----------------------------------------------------------------------  [place] Mfm Ob Follow Up  Result Date: 06/30/2017 ----------------------------------------------------------------------  OBSTETRICS REPORT                      (Signed Final 06/30/2017 11:18 am) ---------------------------------------------------------------------- Patient Info  ID #:       [id number]                          D.O.B.:  Jun 25, 1985 (31 yrs)  Name:       Nicholes Mango                Visit Date: 06/30/2017 09:07 am ---------------------------------------------------------------------- Performed By  Performed By:     Hurman Horn          Secondary Phy.:   Catrina Fellenz A                    RDMS  Macon Large MD  Attending:        Particia Nearing MD       Address:          [address]                                                              [place], [place]                                                             [address]  Referred By:      Mila Merry           Location:         [facility] for                    [facility]                    Healthcare  Ref. Address:     [address]                    [address] ---------------------------------------------------------------------- Orders   #  Description                                 Code   1  [place] MFM FETAL BPP WO NON STRESS              [id number]   2  [place] MFM OB FOLLOW UP                         [id number]  ----------------------------------------------------------------------   #  Ordered By               Order #        Accession #    Episode #   1  MARK NEWMAN              [id number]      [id number]     [id number]   2  MARK NEWMAN              [id number]      [id number]     [id number]  ---------------------------------------------------------------------- Indications   [redacted] weeks gestation of pregnancy                Z3A.33   Pregnancy resulting from assisted              O09.819   reproductive technology (IVF)   Poor obstetric history: Prior fetal  O09.299   macrosomia, antepartum; G2; 13+8   History of cesarean delivery, currently        O34.219   pregnant   Poor obstetric history: Previous preterm       O09.219   delivery, antepartum @ 36wks   Umbilical vein abnormality complicating        O69.9XX0   pregnancy (UVV)   Pre-existing diabetes, type 1, in pregnancy,   O24.013   third trimester (insulin pump)  ---------------------------------------------------------------------- OB History  Gravidity:    4         Term:   1        Prem:   1  Living:       2 ---------------------------------------------------------------------- Fetal Evaluation  Num Of Fetuses:     1  Fetal Heart         158  Rate(bpm):  Cardiac Activity:   Observed  Presentation:       Cephalic  Placenta:           Anterior, above cervical os  P. Cord Insertion:  Visualized  Amniotic Fluid  AFI FV:       Subjectively within normal limits  AFI Sum(cm)     %Tile       Largest Pocket(cm)  18.59           69          5.68  RUQ(cm)       RLQ(cm)       LUQ(cm)        LLQ(cm)  5.68          5.67          5.59           1.65 ---------------------------------------------------------------------- Biophysical Evaluation  Amniotic F.V:   Within normal limits       F. Tone:        Observed  F. Movement:    Not Observed               N.S.T:          Reactive  F. Breathing:   Observed                   Score:          8/10 ---------------------------------------------------------------------- Biometry  BPD:      89.7  mm     G. Age:  36w 2d         99  %    CI:        78.08   %    70 - 86                                                          FL/HC:      18.3   %    19.9 - 21.5  HC:      321.2  mm     G. Age:  36w 2d         85  %    HC/AC:      0.89        0.96 - 1.11  AC:      361.6  mm     G. Age:  40w 0d       > 97  %  FL/BPD:     65.4   %    71 - 87  FL:       58.7  mm     G. Age:  30w 5d        < 3  %    FL/AC:      16.2   %    20 - 24  Est. FW:    3150  gm    6 lb 15 oz    > 90  % ---------------------------------------------------------------------- Gestational Age  U/S Today:     35w 6d                                        EDD:   07/29/17  Best:          33w 2d     Det. By:  D.O. Conception          EDD:   08/16/17 ---------------------------------------------------------------------- Anatomy  Cranium:               Appears normal         Aortic Arch:            Previously seen  Cavum:                 Appears normal         Ductal Arch:            Previously seen  Ventricles:            Previously seen        Diaphragm:              Appears normal  Choroid Plexus:        Previously seen        Stomach:                Appears normal, left                                                                        sided  Cerebellum:            Previously seen        Abdomen:                Umbilical vein                                                                         varix (1.5)  Posterior Fossa:       Previously seen        Abdominal Wall:         Previously seen  Nuchal Fold:           Previously seen        Cord Vessels:           Previously seen  Face:  Orbits and profile     Kidneys:                Appear normal                         previously seen  Lips:                  Previously seen        Bladder:                Appears normal  Thoracic:              Appears normal         Spine:                  Previously seen  Heart:                 Appears normal         Upper Extremities:      Previously seen                         (4CH, axis, and situs  RVOT:                  Appears normal         Lower Extremities:      Previously seen  LVOT:                  Appears normal  Other:  Female gender. Heels previously visualized. ---------------------------------------------------------------------- Cervix Uterus Adnexa  Cervix  Not visualized (advanced GA >29wks)  Uterus  No abnormality visualized.  Left Ovary  Not visualized.  Right Ovary  Not visualized. ---------------------------------------------------------------------- Impression  SIUP at 33+2 weeks  Cephalic presentation  UVV without filling defect identified  All other interval fetal anatomy was seen and appeared  normal; anatomic survey complete  Normal amniotic fluid volume  EFW > 90th %tile; AC > 97th %tile; 3150 grams; 6+15; fetus  at risk to be LGA/macrosomic  BPP 8/10 (-2 for gross body movements) ---------------------------------------------------------------------- Recommendations  Continue twice weekly UVV assessments ----------------------------------------------------------------------                 Particia Nearing, MD Electronically Signed Final Report   06/30/2017 11:18 am ----------------------------------------------------------------------   Assessment and Plan:  Pregnancy: [id number] at [redacted]w[redacted]d  1. Pre-existing type 1 diabetes  mellitus during pregnancy in third trimester Recent HgA1C was around 6; Endocrinologistmade some adjustments last week. Continue to follow their recommendations as per her insulin pump dosing. Follow up growth scan scheduled soon.   2. Umbilical vein abnormality affecting pregnancy, single or unspecified fetus 3. Previous cesarean delivery, antepartum condition or complication Continue 2x/week BPP as per MFM. Repeat cesarean section scheduled on 07/27/17.   4. Supervision of high risk pregnancy, antepartum Pelvic cultures done today - Strep Gp B NAA - Cervicovaginal ancillary only Preterm labor symptoms and general obstetric precautions including but not limited to vaginal bleeding, contractions, leaking of fluid and fetal movement were reviewed in detail with the patient. Please refer to After Visit Summary for other counseling recommendations.  Return in about 1 week (around 07/21/2017) for OB Visit.  Future Appointments  Date Time Provider Department Center  07/14/2017 10:00 AM WH-MFC [place] 3 WH-MFCUS MFC-US  07/17/2017  9:15 AM WH-MFC [place] 4 WH-MFCUS MFC-US  07/21/2017  8:15 AM WH-MFC [place] 4 WH-MFCUS MFC-US  07/24/2017  7:45  AM WH-MFC [place] 2 WH-MFCUS MFC-US    Jaynie Collins, MD

## 2017-07-15 ENCOUNTER — Telehealth (HOSPITAL_COMMUNITY): Payer: Self-pay | Admitting: *Deleted

## 2017-07-15 LAB — CERVICOVAGINAL ANCILLARY ONLY
CHLAMYDIA, DNA PROBE: NEGATIVE
Neisseria Gonorrhea: NEGATIVE

## 2017-07-15 LAB — POCT URINE QUALITATIVE DIPSTICK BLOOD: RBC UA: NEGATIVE

## 2017-07-15 NOTE — Telephone Encounter (Signed)
Preadmission screen  

## 2017-07-15 NOTE — Addendum Note (Signed)
Addended by: Cheree Ditto, Rejeana Fadness A on: 07/15/2017 10:52 AM   Modules accepted: Orders

## 2017-07-16 ENCOUNTER — Telehealth (HOSPITAL_COMMUNITY): Payer: Self-pay | Admitting: *Deleted

## 2017-07-16 LAB — STREP GP B NAA: STREP GROUP B AG: NEGATIVE

## 2017-07-16 NOTE — Telephone Encounter (Signed)
Preadmission screen  

## 2017-07-17 ENCOUNTER — Other Ambulatory Visit (HOSPITAL_COMMUNITY): Payer: Self-pay | Admitting: Maternal and Fetal Medicine

## 2017-07-17 ENCOUNTER — Ambulatory Visit (HOSPITAL_COMMUNITY)
Admission: RE | Admit: 2017-07-17 | Discharge: 2017-07-17 | Disposition: A | Discharge status: Home / Self Care | Payer: 59 | Source: Ambulatory Visit | Attending: Obstetrics & Gynecology | Admitting: Obstetrics & Gynecology

## 2017-07-17 ENCOUNTER — Encounter (HOSPITAL_COMMUNITY): Payer: Self-pay

## 2017-07-17 DIAGNOSIS — Z3A35 35 weeks gestation of pregnancy: Secondary | ICD-10-CM

## 2017-07-17 DIAGNOSIS — O24013 Pre-existing diabetes mellitus, type 1, in pregnancy, third trimester: Secondary | ICD-10-CM | POA: Diagnosis not present

## 2017-07-17 DIAGNOSIS — IMO0001 Reserved for inherently not codable concepts without codable children: Secondary | ICD-10-CM

## 2017-07-17 DIAGNOSIS — O34219 Maternal care for unspecified type scar from previous cesarean delivery: Secondary | ICD-10-CM | POA: Diagnosis not present

## 2017-07-17 DIAGNOSIS — O09293 Supervision of pregnancy with other poor reproductive or obstetric history, third trimester: Secondary | ICD-10-CM | POA: Diagnosis not present

## 2017-07-17 DIAGNOSIS — O09813 Supervision of pregnancy resulting from assisted reproductive technology, third trimester: Secondary | ICD-10-CM | POA: Diagnosis not present

## 2017-07-20 ENCOUNTER — Encounter (HOSPITAL_COMMUNITY): Payer: Self-pay

## 2017-07-20 DIAGNOSIS — E109 Type 1 diabetes mellitus without complications: Secondary | ICD-10-CM | POA: Diagnosis not present

## 2017-07-21 ENCOUNTER — Encounter (HOSPITAL_COMMUNITY): Payer: Self-pay

## 2017-07-21 ENCOUNTER — Ambulatory Visit (HOSPITAL_COMMUNITY)
Admission: RE | Admit: 2017-07-21 | Discharge: 2017-07-21 | Disposition: A | Discharge status: Home / Self Care | Payer: 59 | Source: Ambulatory Visit | Attending: Obstetrics & Gynecology | Admitting: Obstetrics & Gynecology

## 2017-07-21 ENCOUNTER — Ambulatory Visit (INDEPENDENT_AMBULATORY_CARE_PROVIDER_SITE_OTHER): Payer: 59 | Admitting: Family Medicine

## 2017-07-21 VITALS — BP 144/91 | HR 86

## 2017-07-21 DIAGNOSIS — O24013 Pre-existing diabetes mellitus, type 1, in pregnancy, third trimester: Secondary | ICD-10-CM

## 2017-07-21 DIAGNOSIS — IMO0001 Reserved for inherently not codable concepts without codable children: Secondary | ICD-10-CM

## 2017-07-21 DIAGNOSIS — O358XX0 Maternal care for other (suspected) fetal abnormality and damage, not applicable or unspecified: Secondary | ICD-10-CM

## 2017-07-21 DIAGNOSIS — Z3A36 36 weeks gestation of pregnancy: Secondary | ICD-10-CM | POA: Diagnosis not present

## 2017-07-21 DIAGNOSIS — O99891 Other specified diseases and conditions complicating pregnancy: Secondary | ICD-10-CM | POA: Insufficient documentation

## 2017-07-21 DIAGNOSIS — O34219 Maternal care for unspecified type scar from previous cesarean delivery: Secondary | ICD-10-CM

## 2017-07-21 DIAGNOSIS — O133 Gestational [pregnancy-induced] hypertension without significant proteinuria, third trimester: Secondary | ICD-10-CM

## 2017-07-21 DIAGNOSIS — O0990 Supervision of high risk pregnancy, unspecified, unspecified trimester: Secondary | ICD-10-CM

## 2017-07-21 NOTE — Patient Instructions (Signed)

## 2017-07-21 NOTE — Progress Notes (Signed)
   PRENATAL VISIT NOTE  Subjective:  Karen Alexander is a 32 y.o. [id number] at 340w2d being seen today for ongoing prenatal care.  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  Patient reports no complaints.  Contractions: Not present. Vag. Bleeding: None.  Movement: Present. Denies leaking of fluid.   The following portions of the patient's history were reviewed and updated as appropriate: allergies, current medications, past family history, past medical history, past social history, past surgical history and problem list. Problem list updated.  Objective:   Vitals:   07/21/17 0957 07/21/17 1011  BP: (!) 136/91 (!) 144/91  Pulse: 81 86    Fetal Status: Fetal Heart Rate (bpm): 146   Movement: Present     General:  Alert, oriented and cooperative. Patient is in no acute distress.  Skin: Skin is warm and dry. No rash noted.   Cardiovascular: Normal heart rate noted  Respiratory: Normal respiratory effort, no problems with respiration noted  Abdomen: Soft, gravid, appropriate for gestational age.  Pain/Pressure: Absent     Pelvic: Cervical exam deferred        Extremities: Normal range of motion.  Edema: Trace  Mental Status: Normal mood and affect. Normal behavior. Normal judgment and thought content.  BPP 8/8 today Assessment and Plan:  Pregnancy: [id number] at  340w2d  1. H/O cesarean section complicating pregnancy For Repeat  2. 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 Supervision of high risk pregnancy, antepartum   4. Pre-existing type 1 diabetes mellitus during pregnancy in third trimester On pump managed by Endocrinology Has u/s for growth friday  5. Gestational hypertension w/o significant proteinuria in 3rd trimester Symptoms reviewed negative today, BP is not in severe range. Check labs for delivery at 37 wks. - CBC - Protein / creatinine ratio, urine - Comprehensive metabolic panel  Preterm labor symptoms and general obstetric precautions including but not limited to vaginal bleeding, contractions, leaking of fluid and fetal movement were reviewed in detail with the patient. Please refer to After Visit Summary for other counseling recommendations.  Return in 1 week (on 07/28/2017).  Future Appointments  Date Time Provider Department Center  07/24/2017  7:45 AM [facility] US 2 [facility]  07/24/2017  9:45 AM [facility] PAT 5 [facility] None    Reva Boresanya S Pratt, MD

## 2017-07-22 ENCOUNTER — Encounter: Payer: Self-pay | Admitting: Family Medicine

## 2017-07-22 DIAGNOSIS — O1490 Unspecified pre-eclampsia, unspecified trimester: Secondary | ICD-10-CM | POA: Insufficient documentation

## 2017-07-22 LAB — COMPREHENSIVE METABOLIC PANEL
ALT: 15 IU/L (ref 0–32)
AST: 25 IU/L (ref 0–40)
Albumin/Globulin Ratio: 1.2 (ref 1.2–2.2)
Albumin: 2.9 g/dL — ABNORMAL LOW (ref 3.5–5.5)
Alkaline Phosphatase: 114 IU/L (ref 39–117)
BUN/Creatinine Ratio: 10 (ref 9–23)
BUN: 7 mg/dL (ref 6–20)
Bilirubin Total: 0.3 mg/dL (ref 0.0–1.2)
CALCIUM: 8.6 mg/dL — AB (ref 8.7–10.2)
CO2: 20 mmol/L (ref 20–29)
Chloride: 105 mmol/L (ref 96–106)
Creatinine, Ser: 0.72 mg/dL (ref 0.57–1.00)
GFR calc Af Amer: 127 mL/min/{1.73_m2} (ref >59)
GFR, EST NON AFRICAN AMERICAN: 110 mL/min/{1.73_m2} (ref >59)
Globulin, Total: 2.5 g/dL (ref 1.5–4.5)
Glucose: 126 mg/dL — ABNORMAL HIGH (ref 65–99)
Potassium: 4.3 mmol/L (ref 3.5–5.2)
Sodium: 138 mmol/L (ref 134–144)
Total Protein: 5.4 g/dL — ABNORMAL LOW (ref 6.0–8.5)

## 2017-07-22 LAB — CBC
HEMATOCRIT: 38.9 % (ref 34.0–46.6)
HEMOGLOBIN: 12.7 g/dL (ref 11.1–15.9)
MCH: 30.8 pg (ref 26.6–33.0)
MCHC: 32.6 g/dL (ref 31.5–35.7)
MCV: 94 fL (ref 79–97)
Platelets: 147 10*3/uL — ABNORMAL LOW (ref 150–450)
RBC: 4.12 x10E6/uL (ref 3.77–5.28)
RDW: 14 % (ref 12.3–15.4)
WBC: 6.2 10*3/uL (ref 3.4–10.8)

## 2017-07-22 LAB — PROTEIN / CREATININE RATIO, URINE
Creatinine, Urine: 53.8 mg/dL
PROTEIN UR: 18.8 mg/dL
Protein/Creat Ratio: 349 mg/g creat — ABNORMAL HIGH (ref 0–200)

## 2017-07-23 NOTE — Patient Instructions (Signed)
Nicholes MangoMichele J Cazarez  07/23/2017   Your procedure is scheduled on:  07/27/2017  [facility] at 0730 AM.  Pick up the phone at the desk and dial [phone]  Call this number if you have problems the morning of surgery:[phone]  Remember:   Do not eat food:(After Midnight) Desps de medianoche.  Do not drink clear liquids: (After Midnight) Desps de medianoche.  Take these medicines the morning of surgery with A SIP OF WATER: insulin pump per Endocrinologist instructions   Do not wear jewelry, make-up or nail polish.  Do not wear lotions, powders, or perfumes. Do not wear deodorant.  Do not shave 48 hours prior to surgery.  Do not bring valuables to the hospital.  [facility] is not   responsible for any belongings or valuables brought to the hospital.  Contacts, dentures or bridgework may not be worn into surgery.  Leave suitcase in the car. After surgery it may be brought to your room.  For patients admitted to the hospital, checkout time is 11:00 AM the day of              discharge.    N/A   Please read over the following fact sheets that you were given:   Surgical Site Infection Prevention

## 2017-07-24 ENCOUNTER — Other Ambulatory Visit: Payer: Self-pay | Admitting: Obstetrics & Gynecology

## 2017-07-24 ENCOUNTER — Ambulatory Visit (HOSPITAL_COMMUNITY)
Admission: RE | Admit: 2017-07-24 | Discharge: 2017-07-24 | Disposition: A | Discharge status: Home / Self Care | Payer: 59 | Source: Ambulatory Visit | Attending: Obstetrics & Gynecology | Admitting: Obstetrics & Gynecology

## 2017-07-24 ENCOUNTER — Encounter (HOSPITAL_COMMUNITY)
Admission: RE | Admit: 2017-07-24 | Discharge: 2017-07-24 | Disposition: A | Discharge status: Home / Self Care | Payer: 59 | Source: Ambulatory Visit | Attending: Obstetrics and Gynecology | Admitting: Obstetrics and Gynecology

## 2017-07-24 ENCOUNTER — Encounter (HOSPITAL_COMMUNITY): Payer: Self-pay

## 2017-07-24 DIAGNOSIS — O358XX0 Maternal care for other (suspected) fetal abnormality and damage, not applicable or unspecified: Secondary | ICD-10-CM

## 2017-07-24 DIAGNOSIS — O1404 Mild to moderate pre-eclampsia, complicating childbirth: Secondary | ICD-10-CM | POA: Diagnosis not present

## 2017-07-24 DIAGNOSIS — Z362 Encounter for other antenatal screening follow-up: Secondary | ICD-10-CM | POA: Diagnosis not present

## 2017-07-24 DIAGNOSIS — O09813 Supervision of pregnancy resulting from assisted reproductive technology, third trimester: Secondary | ICD-10-CM

## 2017-07-24 DIAGNOSIS — O24013 Pre-existing diabetes mellitus, type 1, in pregnancy, third trimester: Secondary | ICD-10-CM

## 2017-07-24 DIAGNOSIS — IMO0001 Reserved for inherently not codable concepts without codable children: Secondary | ICD-10-CM

## 2017-07-24 DIAGNOSIS — Z3A36 36 weeks gestation of pregnancy: Secondary | ICD-10-CM

## 2017-07-24 DIAGNOSIS — O09299 Supervision of pregnancy with other poor reproductive or obstetric history, unspecified trimester: Secondary | ICD-10-CM

## 2017-07-24 DIAGNOSIS — D6959 Other secondary thrombocytopenia: Secondary | ICD-10-CM | POA: Diagnosis not present

## 2017-07-24 DIAGNOSIS — O09293 Supervision of pregnancy with other poor reproductive or obstetric history, third trimester: Secondary | ICD-10-CM | POA: Insufficient documentation

## 2017-07-24 DIAGNOSIS — O09819 Supervision of pregnancy resulting from assisted reproductive technology, unspecified trimester: Secondary | ICD-10-CM

## 2017-07-24 DIAGNOSIS — O9912 Other diseases of the blood and blood-forming organs and certain disorders involving the immune mechanism complicating childbirth: Secondary | ICD-10-CM | POA: Diagnosis not present

## 2017-07-24 DIAGNOSIS — Z6791 Unspecified blood type, Rh negative: Secondary | ICD-10-CM | POA: Diagnosis not present

## 2017-07-24 DIAGNOSIS — O26893 Other specified pregnancy related conditions, third trimester: Secondary | ICD-10-CM | POA: Diagnosis not present

## 2017-07-24 DIAGNOSIS — D62 Acute posthemorrhagic anemia: Secondary | ICD-10-CM | POA: Diagnosis not present

## 2017-07-24 DIAGNOSIS — O9081 Anemia of the puerperium: Secondary | ICD-10-CM | POA: Diagnosis not present

## 2017-07-24 DIAGNOSIS — O34211 Maternal care for low transverse scar from previous cesarean delivery: Secondary | ICD-10-CM | POA: Diagnosis not present

## 2017-07-24 LAB — CBC
HEMATOCRIT: 39.7 % (ref 36.0–46.0)
HEMOGLOBIN: 13.2 g/dL (ref 12.0–15.0)
MCH: 31.8 pg (ref 26.0–34.0)
MCHC: 33.2 g/dL (ref 30.0–36.0)
MCV: 95.7 fL (ref 78.0–100.0)
PLATELETS: 140 10*3/uL — AB (ref 150–400)
RBC: 4.15 MIL/uL (ref 3.87–5.11)
RDW: 14.5 % (ref 11.5–15.5)
WBC: 7 10*3/uL (ref 4.0–10.5)

## 2017-07-24 LAB — BASIC METABOLIC PANEL
ANION GAP: 7 (ref 5–15)
BUN: 9 mg/dL (ref 6–20)
CHLORIDE: 103 mmol/L (ref 101–111)
CO2: 23 mmol/L (ref 22–32)
Calcium: 8.4 mg/dL — ABNORMAL LOW (ref 8.9–10.3)
Creatinine, Ser: 0.71 mg/dL (ref 0.44–1.00)
GFR calc non Af Amer: >60 mL/min (ref >60)
Glucose, Bld: 112 mg/dL — ABNORMAL HIGH (ref 65–99)
POTASSIUM: 3.9 mmol/L (ref 3.5–5.1)
SODIUM: 133 mmol/L — AB (ref 135–145)

## 2017-07-25 LAB — RPR: RPR: NONREACTIVE

## 2017-07-26 NOTE — Anesthesia Preprocedure Evaluation (Addendum)
Anesthesia Evaluation  Patient identified by MRN, date of birth, ID band Patient awake    Reviewed: Allergy & Precautions, NPO status , Patient's Chart, lab work & pertinent test results  Airway Mallampati: II  TM Distance: >3 FB Neck ROM: Full    Dental no notable dental hx. (+) Teeth Intact, Dental Advisory Given   Pulmonary neg pulmonary ROS,    Pulmonary exam normal breath sounds clear to auscultation       Cardiovascular Exercise Tolerance: Good hypertension, Normal cardiovascular exam Rhythm:Regular Rate:Normal  Gestational HTN   Neuro/Psych negative neurological ROS  negative psych ROS   GI/Hepatic negative GI ROS, Neg liver ROS,   Endo/Other  diabetes, Well Controlled, Type 1, Insulin Dependent  Renal/GU negative Renal ROS  negative genitourinary   Musculoskeletal negative musculoskeletal ROS (+)   Abdominal   Peds  Hematology negative hematology ROS (+)   Anesthesia Other Findings   Reproductive/Obstetrics (+) Pregnancy                            Lab Results  Component Value Date   WBC 7.0 07/24/2017   HGB 13.2 07/24/2017   HCT 39.7 07/24/2017   MCV 95.7 07/24/2017   PLT 140 (L) 07/24/2017    Anesthesia Physical Anesthesia Plan  ASA: III  Anesthesia Plan: Spinal   Post-op Pain Management:    Induction:   PONV Risk Score and Plan:   Airway Management Planned: Mask, Natural Airway and Nasal Cannula  Additional Equipment:   Intra-op Plan:   Post-operative Plan:   Informed Consent: I have reviewed the patients History and Physical, chart, labs and discussed the procedure including the risks, benefits and alternatives for the proposed anesthesia with the patient or authorized representative who has indicated his/her understanding and acceptance.     Plan Discussed with: CRNA  Anesthesia Plan Comments: (PT with Insulin Pump . Read consult note form patients  endocrinologist.Will continue pump therapy during CS at 70% basal rate . Patient will adjust pump in Pacu)      Anesthesia Quick Evaluation

## 2017-07-27 ENCOUNTER — Inpatient Hospital Stay (HOSPITAL_COMMUNITY): Payer: 59 | Admitting: Anesthesiology

## 2017-07-27 ENCOUNTER — Encounter (HOSPITAL_COMMUNITY)
Admission: AD | Disposition: A | Discharge status: Home / Self Care | Payer: Self-pay | Source: Home / Self Care | Attending: Obstetrics and Gynecology

## 2017-07-27 ENCOUNTER — Encounter: Payer: Self-pay | Admitting: Obstetrics and Gynecology

## 2017-07-27 ENCOUNTER — Inpatient Hospital Stay (HOSPITAL_COMMUNITY)
Admission: AD | Admit: 2017-07-27 | Discharge: 2017-07-30 | DRG: 787 | Disposition: A | Discharge status: Home / Self Care | Payer: 59 | Attending: Obstetrics and Gynecology | Admitting: Obstetrics and Gynecology

## 2017-07-27 ENCOUNTER — Encounter (HOSPITAL_COMMUNITY): Payer: Self-pay | Admitting: General Practice

## 2017-07-27 DIAGNOSIS — Z9641 Presence of insulin pump (external) (internal): Secondary | ICD-10-CM | POA: Diagnosis present

## 2017-07-27 DIAGNOSIS — Z3A37 37 weeks gestation of pregnancy: Secondary | ICD-10-CM | POA: Diagnosis not present

## 2017-07-27 DIAGNOSIS — O34219 Maternal care for unspecified type scar from previous cesarean delivery: Secondary | ICD-10-CM

## 2017-07-27 DIAGNOSIS — Z98891 History of uterine scar from previous surgery: Secondary | ICD-10-CM

## 2017-07-27 DIAGNOSIS — Z6791 Unspecified blood type, Rh negative: Secondary | ICD-10-CM

## 2017-07-27 DIAGNOSIS — O99119 Other diseases of the blood and blood-forming organs and certain disorders involving the immune mechanism complicating pregnancy, unspecified trimester: Secondary | ICD-10-CM

## 2017-07-27 DIAGNOSIS — O9912 Other diseases of the blood and blood-forming organs and certain disorders involving the immune mechanism complicating childbirth: Secondary | ICD-10-CM | POA: Diagnosis present

## 2017-07-27 DIAGNOSIS — O3663X0 Maternal care for excessive fetal growth, third trimester, not applicable or unspecified: Secondary | ICD-10-CM | POA: Diagnosis not present

## 2017-07-27 DIAGNOSIS — D62 Acute posthemorrhagic anemia: Secondary | ICD-10-CM | POA: Diagnosis not present

## 2017-07-27 DIAGNOSIS — O26899 Other specified pregnancy related conditions, unspecified trimester: Secondary | ICD-10-CM

## 2017-07-27 DIAGNOSIS — E109 Type 1 diabetes mellitus without complications: Secondary | ICD-10-CM | POA: Diagnosis present

## 2017-07-27 DIAGNOSIS — O1490 Unspecified pre-eclampsia, unspecified trimester: Secondary | ICD-10-CM | POA: Diagnosis present

## 2017-07-27 DIAGNOSIS — O34211 Maternal care for low transverse scar from previous cesarean delivery: Secondary | ICD-10-CM | POA: Diagnosis present

## 2017-07-27 DIAGNOSIS — O9081 Anemia of the puerperium: Secondary | ICD-10-CM | POA: Diagnosis not present

## 2017-07-27 DIAGNOSIS — D6959 Other secondary thrombocytopenia: Secondary | ICD-10-CM | POA: Diagnosis present

## 2017-07-27 DIAGNOSIS — Z794 Long term (current) use of insulin: Secondary | ICD-10-CM

## 2017-07-27 DIAGNOSIS — O358XX0 Maternal care for other (suspected) fetal abnormality and damage, not applicable or unspecified: Secondary | ICD-10-CM | POA: Diagnosis present

## 2017-07-27 DIAGNOSIS — O2402 Pre-existing diabetes mellitus, type 1, in childbirth: Secondary | ICD-10-CM | POA: Diagnosis present

## 2017-07-27 DIAGNOSIS — O0990 Supervision of high risk pregnancy, unspecified, unspecified trimester: Secondary | ICD-10-CM

## 2017-07-27 DIAGNOSIS — O26893 Other specified pregnancy related conditions, third trimester: Secondary | ICD-10-CM | POA: Diagnosis present

## 2017-07-27 DIAGNOSIS — O24419 Gestational diabetes mellitus in pregnancy, unspecified control: Secondary | ICD-10-CM | POA: Diagnosis not present

## 2017-07-27 DIAGNOSIS — O09299 Supervision of pregnancy with other poor reproductive or obstetric history, unspecified trimester: Secondary | ICD-10-CM

## 2017-07-27 DIAGNOSIS — O1404 Mild to moderate pre-eclampsia, complicating childbirth: Secondary | ICD-10-CM | POA: Diagnosis present

## 2017-07-27 DIAGNOSIS — D696 Thrombocytopenia, unspecified: Secondary | ICD-10-CM | POA: Diagnosis present

## 2017-07-27 DIAGNOSIS — O24013 Pre-existing diabetes mellitus, type 1, in pregnancy, third trimester: Secondary | ICD-10-CM

## 2017-07-27 LAB — TYPE AND SCREEN
ABO/RH(D): A NEG
Antibody Screen: POSITIVE
UNIT DIVISION: 0
UNIT DIVISION: 0

## 2017-07-27 LAB — CBC
HCT: 39.4 % (ref 36.0–46.0)
Hemoglobin: 13.3 g/dL (ref 12.0–15.0)
MCH: 31.9 pg (ref 26.0–34.0)
MCHC: 33.8 g/dL (ref 30.0–36.0)
MCV: 94.5 fL (ref 78.0–100.0)
PLATELETS: 135 10*3/uL — AB (ref 150–400)
RBC: 4.17 MIL/uL (ref 3.87–5.11)
RDW: 14 % (ref 11.5–15.5)
WBC: 6.6 10*3/uL (ref 4.0–10.5)

## 2017-07-27 LAB — COMPREHENSIVE METABOLIC PANEL
ALT: 19 U/L (ref 14–54)
AST: 29 U/L (ref 15–41)
Albumin: 2.7 g/dL — ABNORMAL LOW (ref 3.5–5.0)
Alkaline Phosphatase: 117 U/L (ref 38–126)
Anion gap: 10 (ref 5–15)
BILIRUBIN TOTAL: 0.2 mg/dL — AB (ref 0.3–1.2)
BUN: 11 mg/dL (ref 6–20)
CO2: 20 mmol/L — ABNORMAL LOW (ref 22–32)
CREATININE: 0.83 mg/dL (ref 0.44–1.00)
Calcium: 8.7 mg/dL — ABNORMAL LOW (ref 8.9–10.3)
Chloride: 105 mmol/L (ref 101–111)
GFR calc Af Amer: >60 mL/min (ref >60)
Glucose, Bld: 180 mg/dL — ABNORMAL HIGH (ref 65–99)
Potassium: 4.3 mmol/L (ref 3.5–5.1)
Sodium: 135 mmol/L (ref 135–145)
Total Protein: 6 g/dL — ABNORMAL LOW (ref 6.5–8.1)

## 2017-07-27 LAB — BPAM RBC
BLOOD PRODUCT EXPIRATION DATE: 201906302359
Blood Product Expiration Date: 201906302359
UNIT TYPE AND RH: 600
Unit Type and Rh: 600

## 2017-07-27 LAB — PROTEIN / CREATININE RATIO, URINE
Creatinine, Urine: 167 mg/dL
Protein Creatinine Ratio: 0.38 mg/mg{Cre} — ABNORMAL HIGH (ref 0.00–0.15)
Total Protein, Urine: 64 mg/dL

## 2017-07-27 LAB — GLUCOSE, CAPILLARY
GLUCOSE-CAPILLARY: 104 mg/dL — AB (ref 65–99)
GLUCOSE-CAPILLARY: 99 mg/dL (ref 65–99)
GLUCOSE-CAPILLARY: 179 mg/dL — AB (ref 65–99)
Glucose-Capillary: 177 mg/dL — ABNORMAL HIGH (ref 65–99)
Glucose-Capillary: 117 mg/dL — ABNORMAL HIGH (ref 65–99)
Glucose-Capillary: 102 mg/dL — ABNORMAL HIGH (ref 65–99)

## 2017-07-27 SURGERY — Surgical Case
Anesthesia: Spinal

## 2017-07-27 MED ORDER — OXYTOCIN 10 UNIT/ML IJ SOLN
INTRAMUSCULAR | Status: AC
  Filled 2017-07-27: qty 4

## 2017-07-27 MED ORDER — ONDANSETRON HCL 4 MG/2ML IJ SOLN
INTRAMUSCULAR | Status: AC
  Filled 2017-07-27: qty 2

## 2017-07-27 MED ORDER — SIMETHICONE 80 MG PO CHEW
80.0000 mg | CHEWABLE_TABLET | Freq: Three times a day (TID) | ORAL | Status: DC
  Administered 2017-07-27 – 2017-07-29 (×6): 80 mg via ORAL
  Filled 2017-07-27 (×6): qty 1

## 2017-07-27 MED ORDER — TRANEXAMIC ACID 1000 MG/10ML IV SOLN
1000.0000 mg | Freq: Once | INTRAVENOUS | Status: AC
  Administered 2017-07-27: 1000 mg via INTRAVENOUS
  Filled 2017-07-27: qty 1100

## 2017-07-27 MED ORDER — SIMETHICONE 80 MG PO CHEW: 80.0000 mg | CHEWABLE_TABLET | ORAL | Status: DC | PRN

## 2017-07-27 MED ORDER — PHENYLEPHRINE 8 MG IN D5W 100 ML (0.08MG/ML) PREMIX OPTIME
INJECTION | INTRAVENOUS | Status: AC
  Filled 2017-07-27: qty 100

## 2017-07-27 MED ORDER — HYDROMORPHONE HCL 1 MG/ML IJ SOLN: 0.2500 mg | INTRAMUSCULAR | Status: DC | PRN

## 2017-07-27 MED ORDER — NALBUPHINE HCL 10 MG/ML IJ SOLN
5.0000 mg | Freq: Four times a day (QID) | INTRAMUSCULAR | Status: DC | PRN
  Administered 2017-07-27: 5 mg via INTRAVENOUS
  Filled 2017-07-27: qty 1

## 2017-07-27 MED ORDER — SODIUM CHLORIDE 0.9 % IR SOLN
Status: DC | PRN
  Administered 2017-07-27: 1

## 2017-07-27 MED ORDER — ZOLPIDEM TARTRATE 5 MG PO TABS: 5.0000 mg | ORAL_TABLET | Freq: Every evening | ORAL | Status: DC | PRN

## 2017-07-27 MED ORDER — PHENYLEPHRINE 8 MG IN D5W 100 ML (0.08MG/ML) PREMIX OPTIME
INJECTION | INTRAVENOUS | Status: DC | PRN
  Administered 2017-07-27: 30 ug/min via INTRAVENOUS

## 2017-07-27 MED ORDER — MISOPROSTOL 200 MCG PO TABS
ORAL_TABLET | ORAL | Status: AC
  Filled 2017-07-27: qty 4

## 2017-07-27 MED ORDER — LACTATED RINGERS IV SOLN
INTRAVENOUS | Status: DC
Start: 2017-07-27 — End: 2017-07-30

## 2017-07-27 MED ORDER — OXYCODONE HCL 5 MG PO TABS
5.0000 mg | ORAL_TABLET | ORAL | Status: DC | PRN
  Administered 2017-07-28 – 2017-07-29 (×3): 5 mg via ORAL
  Filled 2017-07-27 (×6): qty 1

## 2017-07-27 MED ORDER — PRENATAL MULTIVITAMIN CH
1.0000 | ORAL_TABLET | Freq: Every day | ORAL | Status: DC
  Administered 2017-07-27 – 2017-07-29 (×3): 1 via ORAL
  Filled 2017-07-27 (×4): qty 1

## 2017-07-27 MED ORDER — ACETAMINOPHEN 10 MG/ML IV SOLN
INTRAVENOUS | Status: AC
  Filled 2017-07-27: qty 100

## 2017-07-27 MED ORDER — OXYTOCIN 40 UNITS IN LACTATED RINGERS INFUSION - SIMPLE MED: 2.5000 [IU]/h | INTRAVENOUS | Status: AC

## 2017-07-27 MED ORDER — ACETAMINOPHEN 325 MG PO TABS: 650.0000 mg | ORAL_TABLET | ORAL | Status: DC | PRN

## 2017-07-27 MED ORDER — MORPHINE SULFATE (PF) 0.5 MG/ML IJ SOLN
INTRAMUSCULAR | Status: DC | PRN
  Administered 2017-07-27: .2 mg via EPIDURAL

## 2017-07-27 MED ORDER — CEFAZOLIN SODIUM-DEXTROSE 2-4 GM/100ML-% IV SOLN
2.0000 g | INTRAVENOUS | Status: AC
  Administered 2017-07-27: 2 g via INTRAVENOUS
  Filled 2017-07-27: qty 100

## 2017-07-27 MED ORDER — MORPHINE SULFATE (PF) 0.5 MG/ML IJ SOLN
INTRAMUSCULAR | Status: AC
  Filled 2017-07-27: qty 10

## 2017-07-27 MED ORDER — IBUPROFEN 600 MG PO TABS
600.0000 mg | ORAL_TABLET | Freq: Four times a day (QID) | ORAL | Status: DC
  Administered 2017-07-27 – 2017-07-30 (×11): 600 mg via ORAL
  Filled 2017-07-27 (×11): qty 1

## 2017-07-27 MED ORDER — SIMETHICONE 80 MG PO CHEW
80.0000 mg | CHEWABLE_TABLET | ORAL | Status: DC
  Administered 2017-07-28 – 2017-07-30 (×3): 80 mg via ORAL
  Filled 2017-07-27 (×3): qty 1

## 2017-07-27 MED ORDER — DIPHENHYDRAMINE HCL 25 MG PO CAPS
25.0000 mg | ORAL_CAPSULE | Freq: Four times a day (QID) | ORAL | Status: DC | PRN
  Administered 2017-07-27 – 2017-07-28 (×2): 25 mg via ORAL
  Filled 2017-07-27 (×3): qty 1

## 2017-07-27 MED ORDER — TETANUS-DIPHTH-ACELL PERTUSSIS 5-2.5-18.5 LF-MCG/0.5 IM SUSP: 0.5000 mL | Freq: Once | INTRAMUSCULAR | Status: DC

## 2017-07-27 MED ORDER — WITCH HAZEL-GLYCERIN EX PADS: 1.0000 "application " | MEDICATED_PAD | CUTANEOUS | Status: DC | PRN

## 2017-07-27 MED ORDER — ENOXAPARIN SODIUM 40 MG/0.4ML ~~LOC~~ SOLN
40.0000 mg | SUBCUTANEOUS | Status: DC
  Administered 2017-07-28 – 2017-07-29 (×2): 40 mg via SUBCUTANEOUS
  Filled 2017-07-27 (×2): qty 0.4

## 2017-07-27 MED ORDER — OXYCODONE HCL 5 MG PO TABS
10.0000 mg | ORAL_TABLET | ORAL | Status: DC | PRN
  Administered 2017-07-29 – 2017-07-30 (×4): 10 mg via ORAL
  Filled 2017-07-27 (×3): qty 2

## 2017-07-27 MED ORDER — MENTHOL 3 MG MT LOZG: 1.0000 | LOZENGE | OROMUCOSAL | Status: DC | PRN

## 2017-07-27 MED ORDER — SENNOSIDES-DOCUSATE SODIUM 8.6-50 MG PO TABS
2.0000 | ORAL_TABLET | ORAL | Status: DC
  Administered 2017-07-28 – 2017-07-30 (×3): 2 via ORAL
  Filled 2017-07-27 (×5): qty 2

## 2017-07-27 MED ORDER — OXYTOCIN 10 UNIT/ML IJ SOLN
INTRAVENOUS | Status: DC | PRN
  Administered 2017-07-27: 40 [IU] via INTRAVENOUS

## 2017-07-27 MED ORDER — BUPIVACAINE IN DEXTROSE 0.75-8.25 % IT SOLN
INTRATHECAL | Status: DC | PRN
  Administered 2017-07-27: 1.6 mg via INTRATHECAL

## 2017-07-27 MED ORDER — DIBUCAINE 1 % RE OINT: 1.0000 "application " | TOPICAL_OINTMENT | RECTAL | Status: DC | PRN

## 2017-07-27 MED ORDER — ONDANSETRON HCL 4 MG/2ML IJ SOLN
INTRAMUSCULAR | Status: DC | PRN
  Administered 2017-07-27: 4 mg via INTRAVENOUS

## 2017-07-27 MED ORDER — ACETAMINOPHEN 10 MG/ML IV SOLN
1000.0000 mg | Freq: Once | INTRAVENOUS | Status: DC | PRN
  Administered 2017-07-27: 1000 mg via INTRAVENOUS

## 2017-07-27 MED ORDER — COCONUT OIL OIL: 1.0000 "application " | TOPICAL_OIL | Status: DC | PRN

## 2017-07-27 MED ORDER — DEXAMETHASONE SODIUM PHOSPHATE 10 MG/ML IJ SOLN
INTRAMUSCULAR | Status: AC
  Filled 2017-07-27: qty 1

## 2017-07-27 MED ORDER — MISOPROSTOL 200 MCG PO TABS
800.0000 ug | ORAL_TABLET | Freq: Once | ORAL | Status: AC
  Administered 2017-07-27: 800 ug via RECTAL

## 2017-07-27 MED ORDER — LACTATED RINGERS IV SOLN
INTRAVENOUS | Status: DC
  Administered 2017-07-27 (×2): via INTRAVENOUS

## 2017-07-27 MED ORDER — LACTATED RINGERS IV SOLN
INTRAVENOUS | Status: DC | PRN
  Administered 2017-07-27: 10:00:00 via INTRAVENOUS

## 2017-07-27 MED ORDER — PROMETHAZINE HCL 25 MG/ML IJ SOLN: 6.2500 mg | INTRAMUSCULAR | Status: DC | PRN

## 2017-07-27 MED ORDER — HYDROCODONE-ACETAMINOPHEN 7.5-325 MG PO TABS: 1.0000 | ORAL_TABLET | Freq: Once | ORAL | Status: DC | PRN

## 2017-07-27 MED ORDER — LORATADINE 10 MG PO TABS
10.0000 mg | ORAL_TABLET | Freq: Every day | ORAL | Status: DC
  Administered 2017-07-27 – 2017-07-29 (×3): 10 mg via ORAL
  Filled 2017-07-27 (×4): qty 1

## 2017-07-27 MED ORDER — SOD CITRATE-CITRIC ACID 500-334 MG/5ML PO SOLN
30.0000 mL | ORAL | Status: AC
  Administered 2017-07-27: 30 mL via ORAL
  Filled 2017-07-27: qty 15

## 2017-07-27 MED ORDER — MEPERIDINE HCL 25 MG/ML IJ SOLN: 6.2500 mg | INTRAMUSCULAR | Status: DC | PRN

## 2017-07-27 SURGICAL SUPPLY — 39 items
BENZOIN TINCTURE PRP APPL 2/3 (GAUZE/BANDAGES/DRESSINGS) ×3 IMPLANT
CANISTER SUCT 3000ML PPV (MISCELLANEOUS) ×3 IMPLANT
CHLORAPREP W/TINT 26ML (MISCELLANEOUS) ×3 IMPLANT
CLOSURE STERI STRIP 1/2 X4 (GAUZE/BANDAGES/DRESSINGS) ×3 IMPLANT
DRSG OPSITE POSTOP 4X10 (GAUZE/BANDAGES/DRESSINGS) ×3 IMPLANT
ELECT REM PT RETURN 9FT ADLT (ELECTROSURGICAL) ×3
ELECTRODE REM PT RTRN 9FT ADLT (ELECTROSURGICAL) ×1 IMPLANT
EXTRACTOR VACUUM KIWI (MISCELLANEOUS) ×3 IMPLANT
GLOVE BIOGEL PI IND STRL 7.0 (GLOVE) ×2 IMPLANT
GLOVE BIOGEL PI IND STRL 7.5 (GLOVE) ×1 IMPLANT
GLOVE BIOGEL PI INDICATOR 7.0 (GLOVE) ×4
GLOVE BIOGEL PI INDICATOR 7.5 (GLOVE) ×2
GLOVE SKINSENSE NS SZ7.0 (GLOVE) ×2
GLOVE SKINSENSE STRL SZ7.0 (GLOVE) ×1 IMPLANT
GOWN STRL REUS W/ TWL LRG LVL3 (GOWN DISPOSABLE) ×2 IMPLANT
GOWN STRL REUS W/ TWL XL LVL3 (GOWN DISPOSABLE) ×1 IMPLANT
GOWN STRL REUS W/TWL LRG LVL3 (GOWN DISPOSABLE) ×4
GOWN STRL REUS W/TWL XL LVL3 (GOWN DISPOSABLE) ×2
NS IRRIG 1000ML POUR BTL (IV SOLUTION) ×3 IMPLANT
PACK C SECTION WH (CUSTOM PROCEDURE TRAY) ×3 IMPLANT
PAD ABD 7.5X8 STRL (GAUZE/BANDAGES/DRESSINGS) ×3 IMPLANT
PAD OB MATERNITY 4.3X12.25 (PERSONAL CARE ITEMS) ×3 IMPLANT
PAD PREP 24X48 CUFFED NSTRL (MISCELLANEOUS) ×3 IMPLANT
PENCIL SMOKE EVAC W/HOLSTER (ELECTROSURGICAL) ×3 IMPLANT
RTRCTR WOUND ALEXIS 18CM SML (INSTRUMENTS) ×3
SAVER CELL AAL HAEMONETICS (INSTRUMENTS) ×1 IMPLANT
SPONGE DRAIN TRACH 4X4 STRL 2S (GAUZE/BANDAGES/DRESSINGS) ×6 IMPLANT
STRIP CLOSURE SKIN 1/2X4 (GAUZE/BANDAGES/DRESSINGS) ×2 IMPLANT
SUT CHROMIC 0 CT 1 (SUTURE) ×3 IMPLANT
SUT MNCRL 0 VIOLET CTX 36 (SUTURE) ×2 IMPLANT
SUT MON AB 4-0 PS1 27 (SUTURE) ×3 IMPLANT
SUT MONOCRYL 0 CTX 36 (SUTURE) ×4
SUT PLAIN 2 0 (SUTURE) ×2
SUT PLAIN 2 0 XLH (SUTURE) ×3 IMPLANT
SUT PLAIN ABS 2-0 CT1 27XMFL (SUTURE) ×1 IMPLANT
SUT VIC AB 0 CT1 36 (SUTURE) ×6 IMPLANT
SUT VIC AB 3-0 CT1 27 (SUTURE) ×2
SUT VIC AB 3-0 CT1 TAPERPNT 27 (SUTURE) ×1 IMPLANT
TOWEL OR 17X24 6PK STRL BLUE (TOWEL DISPOSABLE) ×6 IMPLANT

## 2017-07-27 NOTE — Progress Notes (Signed)
PACU Note  Patient had about 300mL of bleeding and boggy uterus and seemed elevated, VS normal and stable except for mildly elevated BPs.  I requested cytotec and to start Lysteda. Patient seen by Dr. Nira Retortegele in the interim.   Firm fundus approx -1 to -2 below the umbilicus and minimal lochia on massage that I observed. VS normal and stable except for mild range BPs.  cytotec already given and Lysteda infusing now. Will continue to follow and won't repeat CBC before tomorrow morning.   Cornelia Copaharlie Lennis Rader, Jr MD Attending Center [facility] 07/27/2017 Time: 1206pm

## 2017-07-27 NOTE — Transfer of Care (Signed)
Immediate Anesthesia Transfer of Care Note  Patient: Karen Alexander  Procedure(s) Performed: REPEAT CESAREAN SECTION (N/A )  Patient Location: PACU  Anesthesia Type:Spinal  Level of Consciousness: awake  Airway & Oxygen Therapy: Patient Spontanous Breathing  Post-op Assessment: Report given to RN and Post -op Vital signs reviewed and stable  Post vital signs: Reviewed and stable  Last Vitals:  Vitals Value Taken Time  BP    Temp    Pulse 65 07/27/2017 11:19 AM  Resp 9 07/27/2017 11:19 AM  SpO2 100 % 07/27/2017 11:19 AM  Vitals shown include unvalidated device data.  Last Pain:  Vitals:   07/27/17 0822  TempSrc:   PainSc: 0-No pain         Complications: No apparent anesthesia complications

## 2017-07-27 NOTE — H&P (Addendum)
Obstetrics Admission History & Physical  07/27/2017 - 9:07 AM Primary OBGYN: [facility]  Chief Complaint: scheduled c/s  History of Present Illness  32 y.o. [id number] @ 10734w1d (Dating: IVF), with the above CC. Pregnancy complicated by: DM1, h/o c/s x 1, IVF pregnancy, mild pre-eclampsia, Rh negative, umbilical vein varix, gestational thrombocytopenia  Ms. Karen Alexander states that she has no s/s of pre-eclampsia  Review of Systems:  as noted in the History of Present Illness.   PMHx:  Past Medical History:  Diagnosis Date  . Diabetes mellitus    diagnosed at age 32  Type 1 per Patient  . Missed ab 07/31/2014   11 wks D & E  . SVD (spontaneous vaginal delivery) 05/27/13   x 1   PSHx:  Past Surgical History:  Procedure Laterality Date  . CESAREAN SECTION N/A 09/05/2015   Procedure: CESAREAN SECTION;  Surgeon: Adam PhenixJames G Arnold, MD;  [facility];  Service: Obstetrics;  Laterality: N/A;  . DILATION AND EVACUATION N/A 07/31/2014   Procedure: DILATATION AND EVACUATION;  Surgeon: Catalina AntiguaPeggy Constant, MD;  Location: [facility];  Service: Gynecology;  Laterality: N/A;  . egg retrieval  August  . WISDOM TOOTH EXTRACTION  2012   x 4   Medications:  Medications Prior to Admission  Medication Sig Dispense Refill Last Dose  . B-D ULTRA-FINE 33 LANCETS MISC    07/27/2017 at Unknown time  . glucose blood (BAYER CONTOUR NEXT TEST) test strip Reported on 07/31/2015   07/27/2017 at Unknown time  . insulin lispro (HUMALOG) 100 UNIT/ML injection USE AS DIRECTED IN INSULIN PUMP   07/27/2017 at Unknown time  . loratadine (CLARITIN) 10 MG tablet Take 10 mg by mouth daily.   07/26/2017 at Unknown time  . Prenatal Vit-Fe Fumarate-FA (MULTIVITAMIN-PRENATAL) 27-0.8 MG TABS tablet Take 1 tablet by mouth daily.    07/26/2017 at Unknown time     Allergies: has No Known Allergies. OBHx:  OB History  Gravida Para Term Preterm AB Living  4 2 1 1 1 2   SAB TAB Ectopic Multiple Live Births  1 0 0  0 2    # Outcome Date GA Lbr Len/2nd Weight Sex Delivery Anes PTL Lv  4 Current           3 Term 09/05/15 2745w1d  13 lb 8.8 oz (6.145 kg) M CS-LTranv Spinal  LIV  2 Preterm 05/27/13 7231w6d 02:39 / 00:50 9 lb 1.3 oz (4.12 kg) M Vag-Spont EPI  LIV  1 SAB                       FHx:  Family History  Problem Relation Age of Onset  . Gout Father   . Hypertension Father   . Diabetes Maternal Grandmother        type2  . Hypothyroidism Paternal Grandmother   . Thyroid disease Paternal Grandmother   . Diabetes Paternal Grandfather        type 2  . Hypertension Paternal Grandfather    Soc Hx:  Social History   Socioeconomic History  . Marital status: Married    Spouse name: Not on file  . Number of children: Not on file  . Years of education: Not on file  . Highest education level: Not on file  Occupational History  . Not on file  Social Needs  . Financial resource strain: Not on file  . Food insecurity:    Worry: Not on file  Inability: Not on file  . Transportation needs:    Medical: Not on file    Non-medical: Not on file  Tobacco Use  . Smoking status: Never Smoker  . Smokeless tobacco: Never Used  Substance and Sexual Activity  . Alcohol use: No  . Drug use: No  . Sexual activity: Yes    Partners: Male    Birth control/protection: None    Comment: pregnant  Lifestyle  . Physical activity:    Days per week: Not on file    Minutes per session: Not on file  . Stress: Not on file  Relationships  . Social connections:    Talks on phone: Not on file    Gets together: Not on file    Attends religious service: Not on file    Active member of club or organization: Not on file    Attends meetings of clubs or organizations: Not on file    Relationship status: Not on file  . Intimate partner violence:    Fear of current or ex partner: Not on file    Emotionally abused: Not on file    Physically abused: Not on file    Forced sexual activity: Not on file  Other Topics  Concern  . Not on file  Social History Narrative  . Not on file    Objective   Vitals:   07/27/17 0852 07/27/17 0902  BP: (!) 133/91 (!) 146/95  Pulse: 85 96  Resp:    Temp:      Current Vital Signs 24h Vital Sign Ranges  T 98.9 F (37.2 C) Temp  Avg: 98.9 F (37.2 C)  Min: 98.9 F (37.2 C)  Max: 98.9 F (37.2 C)  BP (!) 146/95 BP  Min: 133/91  Max: 154/100  HR 96 Pulse  Avg: 90.9  Min: 85  Max: 101  RR 18 Resp  Avg: 18.7  Min: 18  Max: 20  SaO2     No data recorded       24 Hour I/O Current Shift I/O  Time Ins Outs No intake/output data recorded. No intake/output data recorded.   EFM: 155 baseline, +accels, no decel, mod variability  Toco: +irritability  General: Well nourished, well developed female in no acute distress.  Skin:  Warm and dry.  Cardiovascular: S1, S2 normal, no murmur, rub or gallop, regular rate and rhythm Respiratory:  Clear to auscultation bilateral. Normal respiratory effort Abdomen: gravid, soft, nttp. Well healed low transverse skin incision Neuro/Psych:  Normal mood and affect.    Labs  A NEG and no antibodies  Recent Labs  Lab 07/21/17 1033 07/24/17 0923 07/27/17 0803  WBC 6.2 7.0 6.6  HGB 12.7 13.2 13.3  HCT 38.9 39.7 39.4  PLT 147* 140* 135*    Recent Labs  Lab 07/21/17 1033 07/24/17 0923 07/27/17 0803  NA 138 133* 135  K 4.3 3.9 4.3  CL 105 103 105  CO2 20 23 20*  BUN 7 9 11   CREATININE 0.72 0.71 0.83  CALCIUM 8.6* 8.4* 8.7*  PROT 5.4*  --  6.0*  BILITOT 0.3  --  0.2*  ALKPHOS 114  --  117  ALT 15  --  19  AST 25  --  29  GLUCOSE 126* 112* 180*   PC ratio in process  Radiology 6/7: cephalic, afi 27.5, bpp 8/8, efw >90% @ 5168gm, AC >97%, no filling defects seen, anterior placenta  Assessment & Plan   32 y.o. Z6X0960 @ [redacted]w[redacted]d with  mild pre-eclampsia at term, pt stable *Pregnancy: no BTL *DM1: patient followed by Endocrine (see care everywhere) and has post delivery pump settings. BS elevated int eh 170s-180s  here and recommend by anesthesia to turn down basal so she doesn't bottom out. Will d/w peds whether they are okay with delayed cord clamping. Placenta to path *h/o c/s: pt consented. D/w her rationale for pp lovenox and pt to consider and let us know if she's okay with it. SCDs post op.  *Mild pre-x: doing well on no meds and asymptomatic; will continue to follow *Rh neg: rhogam pp prn *Analgesia: should be fine for regional  Cornelia Copa MD Attending Center for [facility])

## 2017-07-27 NOTE — Anesthesia Postprocedure Evaluation (Signed)
Anesthesia Post Note  Patient: Karen Alexander  Procedure(s) Performed: REPEAT CESAREAN SECTION (N/A )     Patient location during evaluation: [facility] Anesthesia Type: Spinal Level of consciousness: awake and alert Pain management: pain level controlled Vital Signs Assessment: post-procedure vital signs reviewed and stable Respiratory status: spontaneous breathing, nonlabored ventilation and respiratory function stable Cardiovascular status: stable Postop Assessment: no headache, no backache, no apparent nausea or vomiting, patient able to bend at knees, able to ambulate, spinal receding and adequate PO intake Anesthetic complications: no    Last Vitals:  Vitals:   07/27/17 1807 07/27/17 1812  BP: (!) 146/92 140/76  Pulse: (!) 113 (!) 128  Resp:    Temp:    SpO2: 98% 98%    Last Pain:  Vitals:   07/27/17 1632  TempSrc: Oral  PainSc:    Pain Goal:                 Laban EmperorMalinova,Karen Alexander

## 2017-07-27 NOTE — Anesthesia Procedure Notes (Signed)
Spinal  Patient location during procedure: OB Start time: 07/27/2017 9:35 AM End time: 07/27/2017 9:41 AM Staffing Anesthesiologist: Trevor IhaHouser, Stephen A, MD Performed: anesthesiologist  Preanesthetic Checklist Completed: patient identified, surgical consent, pre-op evaluation, timeout performed, IV checked, risks and benefits discussed and monitors and equipment checked Spinal Block Patient position: sitting Prep: site prepped and draped and DuraPrep Patient monitoring: heart rate, cardiac monitor, continuous pulse ox and blood pressure Approach: midline Location: L3-4 Injection technique: single-shot Needle Needle type: Pencan  Needle gauge: 24 G Needle length: 10 cm Needle insertion depth: 6 cm Assessment Sensory level: T4

## 2017-07-27 NOTE — Anesthesia Postprocedure Evaluation (Signed)
Anesthesia Post Note  Patient: Karen MangoMichele [name]  Procedure(s) Performed: REPEAT CESAREAN SECTION (N/A )     Patient location during evaluation: PACU Anesthesia Type: Spinal Level of consciousness: awake and alert Pain management: pain level controlled Vital Signs Assessment: post-procedure vital signs reviewed and stable Respiratory status: spontaneous breathing and respiratory function stable Cardiovascular status: blood pressure returned to baseline and stable Postop Assessment: spinal receding Anesthetic complications: no    Last Vitals:  Vitals:   07/27/17 1145 07/27/17 1200  BP: (!) 149/86 (!) 151/91  Pulse: 73 77  Resp: 15 16  Temp:  (!) 36.4 C  SpO2: 100% 99%    Last Pain:  Vitals:   07/27/17 1200  TempSrc: Oral  PainSc:    Pain Goal:                 Trevor IhaStephen A Swayzie Choate

## 2017-07-27 NOTE — Op Note (Addendum)
Cesarean Section Operative Report  Karen Alexander  PROCEDURE DATE: 07/27/2017  PREOPERATIVE DIAGNOSES: Intrauterine pregnancy at 6361w1d weeks gestation; History of prior Cesarean Section. DM type 1. LGA fetus. Desire for repeat cesarean section  POSTOPERATIVE DIAGNOSES: The same. Delivered  PROCEDURE: Repeat Low Transverse Cesarean Section  SURGEON:   Surgeon(s) and Role:    * [place] BingPickens, Chemeka Filice, MD - Primary  ASSISTANT:  Nolene EbbsJulie Degele, MD - OB Fellow   INDICATIONS: Karen MangoMichele J Kain is a 32 y.o. [id number] at 5861w1d here for cesarean section secondary to the indications listed under preoperative diagnoses; please see preoperative note for further details.  The risks of cesarean section were discussed with the patient including but were not limited to: bleeding which may require transfusion or reoperation; infection which may require antibiotics; injury to bowel, bladder, ureters or other surrounding organs; injury to the fetus; need for additional procedures including hysterectomy in the event of a life-threatening hemorrhage; placental abnormalities wth subsequent pregnancies, incisional problems, thromboembolic phenomenon and other postoperative/anesthesia complications.   The patient concurred with the proposed plan, giving informed written consent for the procedure.    FINDINGS:  Viable female infant in cephalic presentation, 4790 g (10 lb 9 oz).  Apgars 8 and 8.  Clear amniotic fluid (2 liters).  Intact placenta, three vessel cord.  Normal uterus, fallopian tubes and ovaries bilaterally.  ANESTHESIA: Spinal INTRAVENOUS FLUIDS: 2100 mL ESTIMATED BLOOD LOSS: 791 mL URINE OUTPUT:  200 ml SPECIMENS: Placenta sent to pathology COMPLICATIONS: None  PROCEDURE IN DETAIL:  The patient was taken to the operating room where anesthesia was administered and normal fetal heart tones were confirmed. She was then prepped and draped in the normal fashion in the dorsal supine position with a leftward tilt.   After a time out was performed, a pfannensteil  skin incision was made with the scalpel and carried through to the underlying layer of fascia. The fascia was then incised at the midline and this incision was extended laterally with the mayo scissors. Attention was turned to the superior aspect of the fascial incision which was grasped with the kocher clamps x 2, tented up and the rectus muscles were dissected off with the scalpel. In a similar fashion the inferior aspect of the fascial incision was grasped with the kocher clamps, tented up and the rectus muscles dissected off with the mayo scissors. The rectus muscles were then separated in the midline and the peritoneum was entered bluntly. The Alexis retractor was inserted and the vesicouterine peritoneum was identified, tented up and entered with the metzenbaum scissors. This incision was extended laterally and the bladder flap was created digitally, and a bladder blade was inserted.  A low transverse hysterotomy was made with the scalpel until the endometrial cavity was breached and the amniotic sac ruptured, yielding clear amniotic fluid. This incision was extended bluntly and the infant's head, shoulders and body were delivered atraumatically.The cord was clamped x 2 and cut, and the infant was handed to the awaiting pediatricians, after delayed cord clamping was done.  The placenta was then gradually expressed from the uterus and then the uterus was exteriorized and cleared of all clots and debris. The hysterotomy was repaired with a running suture of 1-0 Monocryl. A second imbricating layer of 1-0 Monocryl suture was then placed. A Chromic suture was used to help with hemostasis due to slight defect/weakness at the midline aspect of hysterotomy. #2 chromic used to help imbricate this area.   The uterus and adnexa were then returned  to the abdomen, and the hysterotomy and all operative sites were reinspected and excellent hemostasis was noted after  irrigation and suction of the abdomen with warm saline. The peritoneum was closed with a running stitch of 3-0 Vicryl. The fascia was reapproximated with 0 Vicryl in a simple running fashion bilaterally. The subcutaneous layer was then reapproximated with interrupted sutures of 2-0 plain gut, and the skin was then closed with 4-0 monocryl, in a subcuticular fashion.  The patient tolerated the procedure well. Sponge, lap, instrument and needle counts were correct x 3.  She was taken to the recovery room in stable condition.    Disposition: PACU - hemodynamically stable.   Maternal Condition: stable    Signed: Frederik Pear, MD OB Fellow 07/27/2017 11:10 AM     Agree with above. I was present and scrubbed for the entire procedure.   Cornelia Copa MD Attending Center for [facility])

## 2017-07-27 NOTE — Plan of Care (Signed)
  Problem: Nutrition: Goal: Adequate nutrition will be maintained Outcome: Progressing   Problem: Pain Managment: Goal: General experience of comfort will improve Outcome: Progressing   Problem: Safety: Goal: Ability to remain free from injury will improve Outcome: Progressing   Problem: Education: Goal: Knowledge of condition will improve Outcome: Progressing

## 2017-07-27 NOTE — Addendum Note (Signed)
Addendum  created 07/27/17 1829 by Kaysie Michelini H, CRNA   Sign clinical note    

## 2017-07-28 ENCOUNTER — Encounter (HOSPITAL_COMMUNITY): Payer: Self-pay | Admitting: *Deleted

## 2017-07-28 ENCOUNTER — Other Ambulatory Visit: Payer: Self-pay

## 2017-07-28 LAB — CBC
HCT: 31.8 % — ABNORMAL LOW (ref 36.0–46.0)
HEMOGLOBIN: 10.8 g/dL — AB (ref 12.0–15.0)
MCH: 31.9 pg (ref 26.0–34.0)
MCHC: 34 g/dL (ref 30.0–36.0)
MCV: 93.8 fL (ref 78.0–100.0)
Platelets: 131 10*3/uL — ABNORMAL LOW (ref 150–400)
RBC: 3.39 MIL/uL — AB (ref 3.87–5.11)
RDW: 14 % (ref 11.5–15.5)
WBC: 10.4 10*3/uL (ref 4.0–10.5)

## 2017-07-28 LAB — CREATININE, SERUM
CREATININE: 0.68 mg/dL (ref 0.44–1.00)
GFR calc Af Amer: >60 mL/min (ref >60)
GFR calc non Af Amer: >60 mL/min (ref >60)

## 2017-07-28 MED ORDER — RHO D IMMUNE GLOBULIN 1500 UNIT/2ML IJ SOSY
300.0000 ug | PREFILLED_SYRINGE | Freq: Once | INTRAMUSCULAR | Status: AC
  Administered 2017-07-28: 300 ug via INTRAVENOUS
  Filled 2017-07-28: qty 2

## 2017-07-28 MED ORDER — FUROSEMIDE 10 MG/ML IJ SOLN
20.0000 mg | Freq: Once | INTRAMUSCULAR | Status: AC
  Administered 2017-07-28: 20 mg via INTRAVENOUS
  Filled 2017-07-28: qty 2

## 2017-07-28 NOTE — Progress Notes (Signed)
Patient screened out for psychosocial assessment since none of the following apply: °Psychosocial stressors documented in mother or baby's chart °Gestation less than 32 weeks °Code at delivery  °Infant with anomalies °Please contact the Clinical Social Worker if specific needs arise, by MOB's request, or if MOB scores greater than 9/yes to question 10 on Edinburgh Postpartum Depression Screen. ° °Dnya Hickle Boyd-Gilyard, MSW, LCSW °Clinical Social Work °[phone] °  °

## 2017-07-28 NOTE — Progress Notes (Signed)
Subjective: Postpartum Day 1: Cesarean Delivery/ DM 1 with insulin pump, GHTN  Pt without complaints today. Ambulating and voiding without problems. Tolerating diet. Pain controlled.    Objective: Vital signs in last 24 hours: Temp:  [97.4 F (36.3 C)-99 F (37.2 C)] 98.2 F (36.8 C) (06/11 1200) Pulse Rate:  [60-128] 75 (06/11 1200) Resp:  [16-18] 18 (06/11 1200) BP: (128-157)/(75-100) 135/92 (06/11 1200) SpO2:  [98 %-100 %] 100 % (06/11 1200)  Physical Exam:  General: alert Lochia: appropriate Uterine Fundus: firm Incision: healing well DVT Evaluation: No evidence of DVT seen on physical exam.  Recent Labs    07/27/17 0803 07/28/17 0545  HGB 13.3 10.8*  HCT 39.4 31.8*    Assessment/Plan: Status post Cesarean section. Doing well postoperatively.  Continue current care.  Hermina StaggersMichael L Lizanne Erker 07/28/2017, 1:09 PM

## 2017-07-29 ENCOUNTER — Encounter: Payer: Self-pay | Admitting: Obstetrics and Gynecology

## 2017-07-29 LAB — RH IG WORKUP (INCLUDES ABO/RH)
ABO/RH(D): A NEG
Fetal Screen: NEGATIVE
GESTATIONAL AGE(WKS): 37.2
UNIT DIVISION: 0

## 2017-07-29 NOTE — Progress Notes (Signed)
Subjective: Postpartum Day 2: Cesarean Delivery/ DM 1 with insulin pump, GHTN  Pt without complaints today. Ambulating and voiding without problems. Tolerating diet. Pain controlled.  +Flatus.  Objective: Vital signs in last 24 hours: Temp:  [97.7 F (36.5 C)-98.9 F (37.2 C)] 97.7 F (36.5 C) (06/12 0418) Pulse Rate:  [59-86] 59 (06/12 0418) Resp:  [16-18] 16 (06/12 0418) BP: (127-135)/(78-96) 127/78 (06/12 0418) SpO2:  [99 %-100 %] 99 % (06/12 0418)  Physical Exam:  General: alert Lochia: appropriate Uterine Fundus: firm Incision: healing well DVT Evaluation: No evidence of DVT seen on physical exam.  Recent Labs    07/27/17 0803 07/28/17 0545  HGB 13.3 10.8*  HCT 39.4 31.8*    Assessment/Plan: Status post Cesarean section. Doing well postoperatively.  CBGs within control, on insulin pump. Continue current care. Will go home tomorrow  Jaynie CollinsUgonna Athziry Millican, MD 07/29/2017, 7:52 AM

## 2017-07-30 MED ORDER — OXYCODONE HCL 5 MG PO TABS: 5.0000 mg | ORAL_TABLET | ORAL | 0 refills | Status: DC | PRN

## 2017-07-30 MED ORDER — IBUPROFEN 600 MG PO TABS: 600.0000 mg | ORAL_TABLET | Freq: Four times a day (QID) | ORAL | 1 refills | Status: DC

## 2017-07-30 MED FILL — IBUPROFEN 600 MG TABLET: 600 | 8 days supply | Qty: 30 | Fill #0

## 2017-07-30 MED FILL — oxyCODONE HCL 5 MG TABS: 5 | 4 days supply | Qty: 24 | Fill #0

## 2017-07-30 NOTE — Discharge Summary (Signed)
Obstetric Discharge Summary Reason for Admission: Repeat c section, T1DM, and UVV Prenatal Procedures: NST and ultrasound Intrapartum Procedures: cesarean: low cervical, transverse Postpartum Procedures: none Complications-Operative and Postpartum: none Hemoglobin  Date Value Ref Range Status  07/28/2017 10.8 (L) 12.0 - 15.0 g/dL Final  82/95/621306/05/2017 08.612.7 11.1 - 15.9 g/dL Final   HCT  Date Value Ref Range Status  07/28/2017 31.8 (L) 36.0 - 46.0 % Final   Hematocrit  Date Value Ref Range Status  07/21/2017 38.9 34.0 - 46.6 % Final   Hospital Course: Pt was admitted for RLTCS at 37 weeks d/t T1DM and UVV. Underwent RLTCS without problems. See Op note for additional information. Post op course was unremarkable. Pt was maintained on her insulin pump and CBG's were monitored as per pt and her endocrinologist. BP remained stable without S/Sx of PEC.  Pt progressed to ambulating and voiding without problems. Tolerating diet. + Flatus. Good oral pain control.  Felt amendable for discharge home on POD # 3. Discharge instructions, medications and follow up reviewed with pt. Pt verbalized understanding.   Physical Exam:  General: alert Lochia: appropriate Uterine Fundus: firm Incision: healing well DVT Evaluation: No evidence of DVT seen on physical exam.  Discharge Diagnoses: Term Pregnancy-delivered  Discharge Information: Date: 07/30/2017 Activity: pelvic rest Diet: Carb modified Medications: PNV, Ibuprofen and Percocet Condition: stable Instructions: refer to practice specific booklet Discharge to: home Follow-up Information    [facility]. Schedule an appointment as soon as possible for a visit in 1 week(s).   Specialty:  Obstetrics and Gynecology Why:  1 week for BP check Contact information: [address] [place] [id number] [phone]          Newborn Data: Live born female  Birth Weight: 10 lb 9 oz (4790  g) APGAR: 8, 8  Newborn Delivery   Birth date/time:  07/27/2017 10:10:00 Delivery type:  C-Section, Low Transverse Trial of labor:  No C-section categorization:  Repeat     Infant stable in NICU  Hermina StaggersMichael L Lareen Mullings 07/30/2017, 10:27 AM

## 2017-07-30 NOTE — Progress Notes (Signed)
Pt discharged with printed instructions. Pt verbalized an understanding. No concerns noted. Deo Mehringer L Marcina Kinnison, RN 

## 2017-07-30 NOTE — Discharge Instructions (Signed)

## 2017-07-31 LAB — BPAM RBC
Blood Product Expiration Date: 201906302359
Blood Product Expiration Date: 201906302359
Unit Type and Rh: 600
Unit Type and Rh: 600

## 2017-07-31 LAB — TYPE AND SCREEN
ABO/RH(D): A NEG
Antibody Screen: POSITIVE
UNIT DIVISION: 0
Unit division: 0

## 2017-08-03 ENCOUNTER — Ambulatory Visit: Payer: 59 | Admitting: *Deleted

## 2017-08-03 ENCOUNTER — Other Ambulatory Visit: Payer: Self-pay | Admitting: *Deleted

## 2017-08-03 ENCOUNTER — Encounter: Payer: Self-pay | Admitting: *Deleted

## 2017-08-03 VITALS — BP 128/90 | HR 82

## 2017-08-03 DIAGNOSIS — O165 Unspecified maternal hypertension, complicating the puerperium: Secondary | ICD-10-CM

## 2017-08-03 NOTE — Progress Notes (Signed)
Subjective:  Karen Alexander is a 32 y.o. female here for BP check.   Hypertension ROS: patient does not perform home BP monitoring, no TIA's, no chest pain on exertion, no dyspnea on exertion and no swelling of ankles.    Objective:  BP 128/90   Pulse 82   Appearance alert, well appearing, and in no distress. General exam BP noted to be well controlled today in office.    Assessment:   Blood Pressure stable, improved and asymptomatic.   Plan:  Return in 1 weeks for repeat BP check and incision check.

## 2017-08-03 NOTE — Progress Notes (Signed)
I have reviewed this chart and agree with the RN/CMA assessment and management.    K. Meryl Davis, M.D. Attending Obstetrician & Gynecologist, [facility], [facility]  

## 2017-08-03 NOTE — Patient Outreach (Signed)
[facility]) Care Management  08/03/2017  Karen Alexander 10-Apr-1985 [id number]   Karen Alexander returned call and transition of care assessment completed. See transition of care template for details. Karen Alexander was hospitalized from 6/10-6/13 for planned repeat C section. She was discharged home on 6./13 but states her [facility] due to initial  need for oxygen and then for insertion of umbilical line to stabilize his blood sugar. Now she says they are working on getting him to bottle feed sufficiently for discharge.  Karen Alexander says she is doing well, her blood sugars are the best they have been after previous deliveries, her incision pain is well managed with oral analgesic( oxycodone) and she had a blood pressure check at her providers's office today. She reports her blood pressure reading was 128/90. She says her C section incision has the honeycomb dressing on it and she is able to shower and she sees her provider again on 6/24 for blood pressure and incision check. Offered home blood pressure monitor at no charge if her provider recommends home monitoring.  Will close case to [facility] Care Management services and advised Karen Alexander to contact her previous [employer] RN condition manager for ongoing Type I diabetes self management assistance to prevent interruption in the condition management program pharmacy benefits.   Bary RichardJanet S. Hauser RN,CCM,CDE [facility] Care Management Coordinator Office Phone [phone] Office Fax [phone]

## 2017-08-03 NOTE — Patient Outreach (Signed)
[facility]) Care Management  08/03/2017  Nicholes MangoMichele J Rosete [birth year] [id number]   First unsuccessful outreach to complete transition of care assessment for this [facility] member with Type I diabetes who delivered a baby boy via C section on 07/27/17. She was discharged to home on 07/30/17. She was participating in the [facility] Diabetes during pregnancy program. Left message for Elon JesterMichele on her mobile number requesting return call. Await response from [place].   Bary RichardJanet S. Hauser RN,CCM,CDE [facility] Care Management Coordinator Office Phone [phone] Office Fax [phone]

## 2017-08-04 ENCOUNTER — Encounter: Payer: Self-pay | Admitting: *Deleted

## 2017-08-06 MED FILL — IBUPROFEN 600 MG TABLET: 600 | 8 days supply | Qty: 30 | Fill #1

## 2017-08-10 ENCOUNTER — Ambulatory Visit: Payer: 59

## 2017-08-10 VITALS — BP 129/89

## 2017-08-10 DIAGNOSIS — Z5189 Encounter for other specified aftercare: Secondary | ICD-10-CM

## 2017-08-10 NOTE — Progress Notes (Signed)
Subjective:     Karen Alexander is a 32 y.o. female who presents to the clinic one week status post c-section delivery on 07/30/2017. Eating a regular diet without difficulty. Bowel movements are normal. Small amount of pain at her incision site. She is taking motrin to help with pain.   Review of Systems  Objective:    Blood pressure 129/89 General:   normal  Abdomen: soft","bowel sounds active","non-tender  Incision:   incision::"no dehiscence","incision well approximated","healing well","no drainage","no erythema","no hernia","no seroma","no swelling"}     Assessment:    Doing well at this time   Plan:    1. Continue any current medications. 2. Wound care discussed. 3. Activity restrictions: Discuss at discharge 4. Anticipated return to work: after postpartum visit schedule . 5. Follow up: 08/31/2017 for postpartum appointment   Demetrice Emeline DarlingA Graham, CMA

## 2017-08-10 NOTE — Progress Notes (Signed)
I have reviewed the chart and agree with nursing staff's documentation of this patient's encounter.  Jaynie CollinsUgonna Rosanne Wohlfarth, MD 08/10/2017 3:39 PM

## 2017-08-24 DIAGNOSIS — E109 Type 1 diabetes mellitus without complications: Secondary | ICD-10-CM | POA: Diagnosis not present

## 2017-08-28 NOTE — Progress Notes (Signed)
Post Partum Exam  Karen Alexander is a 32 y.o. [phone] female who presents for a postpartum visit. She is 5 weeks postpartum following a Cesarean Section. I have fully reviewed the prenatal and intrapartum course; patient has Type 1 DM, also had UVV resulting in delivery at 37 gestational weeks.  Anesthesia: spinal. Postpartum course has been unremarkable. Baby's course has been unremarkable. Baby is feeding by bottle - Similac Pro Advance. Bleeding thin lochia. Bowel function is normal. Bladder function is normal. Patient is not sexually active. Contraception method is nothing at this time but, desires Mirena IUD. Postpartum depression screening: negative  The following portions of the patient's history were reviewed and updated as appropriate: allergies, current medications, past family history, past medical history, past social history, past surgical history and problem list. Last pap smear done 07/14/2014 and was normal with negative HRHPV  Review of Systems Pertinent items noted in HPI and remainder of comprehensive ROS otherwise negative.    Objective:  Blood pressure 128/82, pulse (!) 101, resp. rate 16, height 5\' 8"  (1.727 m), weight 163 lb 9.6 oz (74.2 kg), not currently breastfeeding.  General:  alert and no distress   Breasts:  deferred  Lungs: normal breath sounds and respiratory effort  Heart:  regular rate and rhythm  Abdomen: soft, non-tender; bowel sounds normal; no masses,  no organomegaly. Incision C/D/I.   Vulva:  normal  Vagina: normal vagina, no discharge, exudate, lesion, or erythema  Cervix:  no bleeding following Pap, no cervical motion tenderness and no lesions  Corpus: normal size, contour, position, consistency, mobility, non-tender  Adnexa:  normal adnexa  Rectal Exam: Not performed.         IUD Insertion Procedure Note Patient identified, informed consent performed, consent signed.   Discussed risks of irregular bleeding, cramping, infection, malpositioning or  misplacement of the IUD outside the uterus which may require further procedure such as laparoscopy. Time out was performed.  Urine pregnancy test negative.  Speculum placed in the vagina.  Cervix visualized.  Cleaned with Betadine x 2.  Grasped anteriorly with a single tooth tenaculum.  Uterus sounded to 8 cm.  Mirena IUD placed per manufacturer's recommendations.  Strings trimmed to 3 cm. Tenaculum was removed, good hemostasis noted.  Patient tolerated procedure well.   Patient was given post-procedure instructions.  She was advised to have backup contraception for one week.  Patient was also asked to check IUD strings periodically and follow up in 4 weeks for IUD check.   Assessment:   Normal postpartum exam. Pap smear done at today's visit. Mirena placed  Plan:   1. Contraception: Mirena IUD placed today, return for check in 4 weeks. 2. Will follow up pap smear results and manage accordingly   Jaynie CollinsUGONNA  [name], MD, FACOG Obstetrician & Gynecologist, [facility], [facility]

## 2017-08-31 ENCOUNTER — Encounter: Payer: Self-pay | Admitting: Obstetrics & Gynecology

## 2017-08-31 ENCOUNTER — Other Ambulatory Visit (HOSPITAL_COMMUNITY)
Admission: RE | Admit: 2017-08-31 | Discharge: 2017-08-31 | Disposition: A | Discharge status: Home / Self Care | Payer: 59 | Source: Ambulatory Visit | Attending: Obstetrics & Gynecology | Admitting: Obstetrics & Gynecology

## 2017-08-31 ENCOUNTER — Ambulatory Visit (INDEPENDENT_AMBULATORY_CARE_PROVIDER_SITE_OTHER): Payer: 59 | Admitting: Obstetrics & Gynecology

## 2017-08-31 DIAGNOSIS — Z3202 Encounter for pregnancy test, result negative: Secondary | ICD-10-CM | POA: Diagnosis not present

## 2017-08-31 DIAGNOSIS — Z1389 Encounter for screening for other disorder: Secondary | ICD-10-CM | POA: Diagnosis not present

## 2017-08-31 DIAGNOSIS — Z30430 Encounter for insertion of intrauterine contraceptive device: Secondary | ICD-10-CM | POA: Diagnosis not present

## 2017-08-31 DIAGNOSIS — Z01419 Encounter for gynecological examination (general) (routine) without abnormal findings: Secondary | ICD-10-CM | POA: Diagnosis not present

## 2017-08-31 LAB — POCT URINE PREGNANCY: PREG TEST UR: NEGATIVE

## 2017-08-31 MED ORDER — LEVONORGESTREL 20 MCG/24HR IU IUD
INTRAUTERINE_SYSTEM | Freq: Once | INTRAUTERINE | Status: AC
  Administered 2017-08-31: 15:00:00 via INTRAUTERINE

## 2017-08-31 NOTE — Patient Instructions (Signed)
Return to clinic for any scheduled appointments or obstetric concerns, or go to MAU for evaluation   Intrauterine Device Insertion, Care After This sheet gives you information about how to care for yourself after your procedure. Your health care provider may also give you more specific instructions. If you have problems or questions, contact your health care provider. What can I expect after the procedure? After the procedure, it is common to have:  Cramps and pain in the abdomen.  Light bleeding (spotting) or heavier bleeding that is like your menstrual period. This may last for up to a few days.  Lower back pain.  Dizziness.  Headaches.  Nausea.  Follow these instructions at home:  Before resuming sexual activity, check to make sure that you can feel the IUD string(s). You should be able to feel the end of the string(s) below the opening of your cervix. If your IUD string is in place, you may resume sexual activity. ? If you had a hormonal IUD inserted more than 7 days after your most recent period started, you will need to use a backup method of birth control for 7 days after IUD insertion. Ask your health care provider whether this applies to you.  Continue to check that the IUD is still in place by feeling for the string(s) after every menstrual period, or once a month.  Take over-the-counter and prescription medicines only as told by your health care provider.  Do not drive or use heavy machinery while taking prescription pain medicine.  Keep all follow-up visits as told by your health care provider. This is important. Contact a health care provider if:  You have bleeding that is heavier or lasts longer than a normal menstrual cycle.  You have a fever.  You have cramps or abdominal pain that get worse or do not get better with medicine.  You develop abdominal pain that is new or is not in the same area of earlier cramping and pain.  You feel lightheaded or weak.  You  have abnormal or bad-smelling discharge from your vagina.  You have pain during sexual activity.  You have any of the following problems with your IUD string(s): ? The string bothers or hurts you or your sexual partner. ? You cannot feel the string. ? The string has gotten longer.  You can feel the IUD in your vagina.  You think you may be pregnant, or you miss your menstrual period.  You think you may have an STI (sexually transmitted infection). Get help right away if:  You have flu-like symptoms.  You have a fever and chills.  You can feel that your IUD has slipped out of place. Summary  After the procedure, it is common to have cramps and pain in the abdomen. It is also common to have light bleeding (spotting) or heavier bleeding that is like your menstrual period.  Continue to check that the IUD is still in place by feeling for the string(s) after every menstrual period, or once a month.  Keep all follow-up visits as told by your health care provider. This is important.  Contact your health care provider if you have problems with your IUD string(s), such as the string getting longer or bothering you or your sexual partner. This information is not intended to replace advice given to you by your health care provider. Make sure you discuss any questions you have with your health care provider. Document Released: 10/02/2010 Document Revised: 12/26/2015 Document Reviewed: 12/26/2015 Elsevier Interactive Patient  Education  2017 Elsevier Inc.  

## 2017-09-02 LAB — CYTOLOGY - PAP
Diagnosis: NEGATIVE
HPV: NOT DETECTED

## 2017-09-25 ENCOUNTER — Encounter: Payer: Self-pay | Admitting: Obstetrics & Gynecology

## 2017-09-25 ENCOUNTER — Ambulatory Visit (INDEPENDENT_AMBULATORY_CARE_PROVIDER_SITE_OTHER): Payer: 59 | Admitting: Obstetrics & Gynecology

## 2017-09-25 VITALS — BP 123/85 | HR 102 | Ht 68.0 in | Wt 161.4 lb

## 2017-09-25 DIAGNOSIS — Z30431 Encounter for routine checking of intrauterine contraceptive device: Secondary | ICD-10-CM

## 2017-09-25 NOTE — Progress Notes (Signed)
Pt presents for IUD sting check. Pt reports no problems.

## 2017-09-25 NOTE — Patient Instructions (Signed)
Return to clinic for any scheduled appointments or for any gynecologic concerns as needed.   

## 2017-09-25 NOTE — Progress Notes (Signed)
   GYNECOLOGY OFFICE PROGRESS NOTE  History:  32 y.o. [id number] here today for today for IUD string check; Mirena IUD was placed 08/31/2017. No complaints about the IUD, no concerning side effects.  The following portions of the patient's history were reviewed and updated as appropriate: allergies, current medications, past family history, past medical history, past social history, past surgical history and problem list. Last pap smear on 08/31/2017 was normal, negative HRHPV.  Review of Systems:  Pertinent items are noted in HPI.   Objective:  Physical Exam Blood pressure 123/85, pulse (!) 102, height 5\' 8"  (1.727 m), weight 161 lb 6.4 oz (73.2 kg), not currently breastfeeding. CONSTITUTIONAL: Well-developed, well-nourished female in no acute distress.  HENT:  Normocephalic, atraumatic. External right and left ear normal. Oropharynx is clear and moist CARDIOVASCULAR: Normal heart rate noted RESPIRATORY: Effort and breath sounds normal, no problems with respiration noted ABDOMEN: Soft, no distention noted.   PELVIC: Normal appearing external genitalia; normal appearing vaginal mucosa and cervix.  IUD strings visualized, about 3 cm in length outside cervix.   Assessment & Plan:  Normal IUD check. Patient to keep IUD in place for seven years; can come in for removal if she desires pregnancy within the next seven years. Routine preventative health maintenance measures emphasized.   Jaynie CollinsUGONNA  Amahia Madonia, MD, FACOG Obstetrician & Gynecologist, [facility], [facility]

## 2017-11-02 DIAGNOSIS — E109 Type 1 diabetes mellitus without complications: Secondary | ICD-10-CM | POA: Diagnosis not present

## 2017-11-06 DIAGNOSIS — E109 Type 1 diabetes mellitus without complications: Secondary | ICD-10-CM | POA: Diagnosis not present

## 2017-11-06 DIAGNOSIS — Z9641 Presence of insulin pump (external) (internal): Secondary | ICD-10-CM | POA: Diagnosis not present

## 2018-01-25 DIAGNOSIS — E109 Type 1 diabetes mellitus without complications: Secondary | ICD-10-CM | POA: Diagnosis not present

## 2018-03-08 DIAGNOSIS — E109 Type 1 diabetes mellitus without complications: Secondary | ICD-10-CM | POA: Diagnosis not present

## 2018-03-12 DIAGNOSIS — E109 Type 1 diabetes mellitus without complications: Secondary | ICD-10-CM | POA: Diagnosis not present

## 2018-03-12 DIAGNOSIS — Z9641 Presence of insulin pump (external) (internal): Secondary | ICD-10-CM | POA: Diagnosis not present

## 2018-05-28 DIAGNOSIS — E109 Type 1 diabetes mellitus without complications: Secondary | ICD-10-CM | POA: Diagnosis not present

## 2018-05-31 MED FILL — CONTOUR NEXT STRIPS: 25 days supply | Qty: 200 | Fill #0

## 2018-06-16 MED FILL — HumaLOG 100 UNIT/ML SOLN: 100 | 42 days supply | Qty: 30 | Fill #0

## 2018-07-06 MED FILL — CONTOUR NEXT STRIPS: 84 days supply | Qty: 500 | Fill #0

## 2018-07-08 DIAGNOSIS — E109 Type 1 diabetes mellitus without complications: Secondary | ICD-10-CM | POA: Diagnosis not present

## 2018-07-15 DIAGNOSIS — Z9641 Presence of insulin pump (external) (internal): Secondary | ICD-10-CM | POA: Diagnosis not present

## 2018-07-15 DIAGNOSIS — E109 Type 1 diabetes mellitus without complications: Secondary | ICD-10-CM | POA: Diagnosis not present

## 2018-08-05 DIAGNOSIS — E109 Type 1 diabetes mellitus without complications: Secondary | ICD-10-CM | POA: Diagnosis not present

## 2018-09-16 MED FILL — HumaLOG 100 UNIT/ML SOLN: 100 | 42 days supply | Qty: 30 | Fill #1

## 2018-09-21 MED FILL — CONTOUR NEXT STRIPS: 84 days supply | Qty: 500 | Fill #0

## 2018-10-11 DIAGNOSIS — H5213 Myopia, bilateral: Secondary | ICD-10-CM | POA: Diagnosis not present

## 2018-10-11 LAB — HM DIABETES EYE EXAM

## 2018-10-12 ENCOUNTER — Encounter: Payer: Self-pay | Admitting: Family Medicine

## 2018-11-08 MED FILL — HumaLOG 100 UNIT/ML SOLN: 100 | 42 days supply | Qty: 30 | Fill #2

## 2018-11-11 DIAGNOSIS — E109 Type 1 diabetes mellitus without complications: Secondary | ICD-10-CM | POA: Diagnosis not present

## 2018-11-17 DIAGNOSIS — Z9641 Presence of insulin pump (external) (internal): Secondary | ICD-10-CM | POA: Diagnosis not present

## 2018-11-17 DIAGNOSIS — E109 Type 1 diabetes mellitus without complications: Secondary | ICD-10-CM | POA: Diagnosis not present

## 2019-03-22 IMAGING — US US MFM FETAL BPP W/O NON-STRESS
1 series · 14 of 28 positions shown · non-contrast
Comparison: none

[Series 1: us mfm fetal bpp w/o non-stress · 38 acquisitions, 14 frames shown]
[im 2/38]
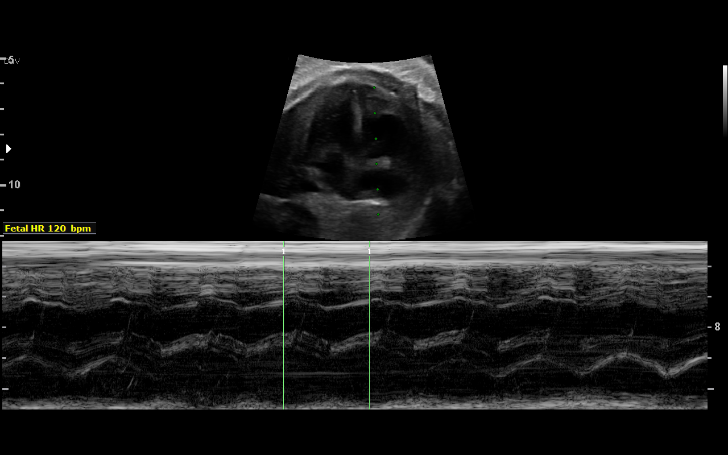
[im 5/38]
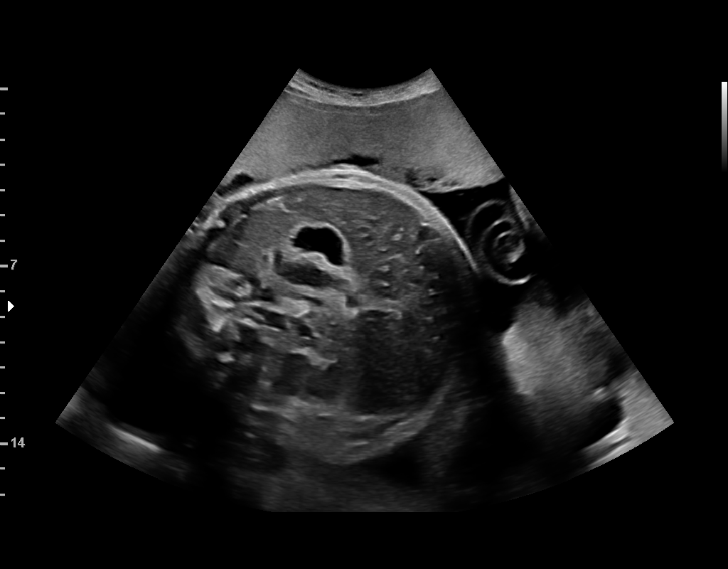
[im 7/38]
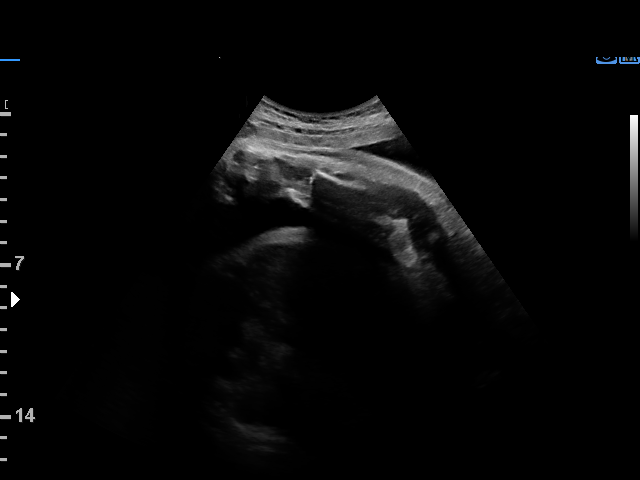
[im 10/38]
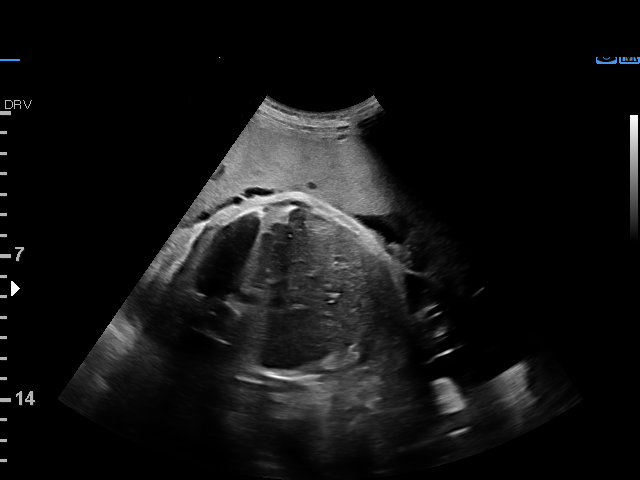
[im 13/38]
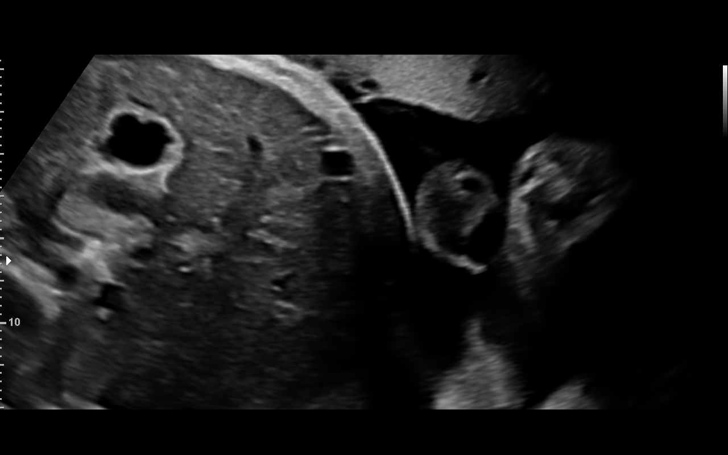
[im 16/38]
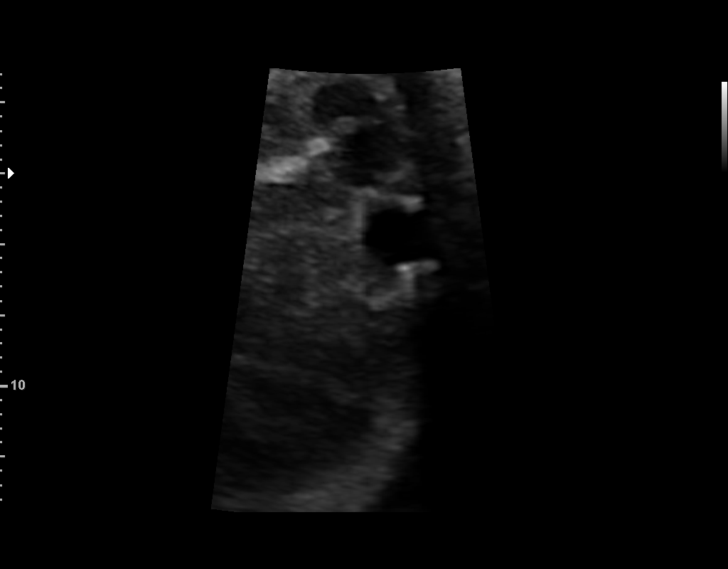
[im 18/38]
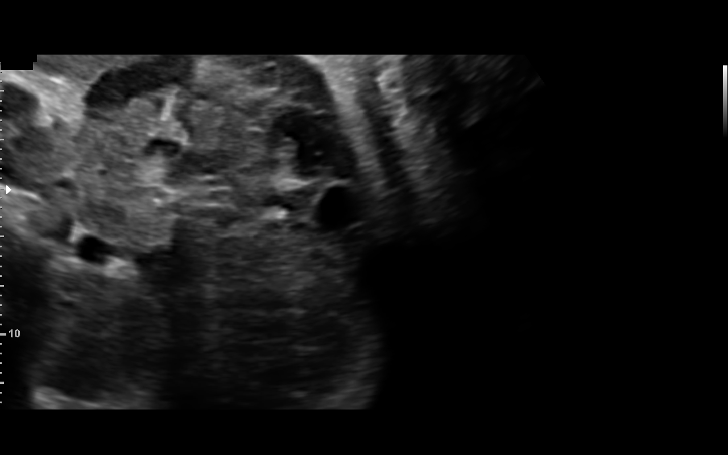
[im 21/38]
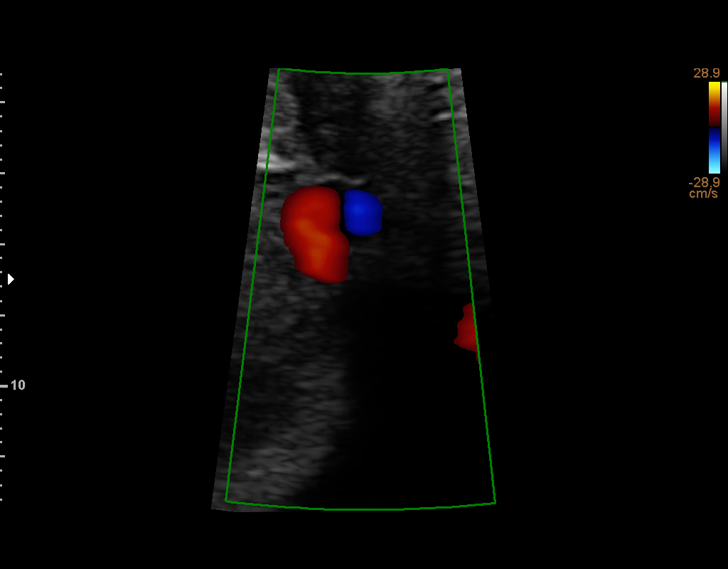
[im 24/38]
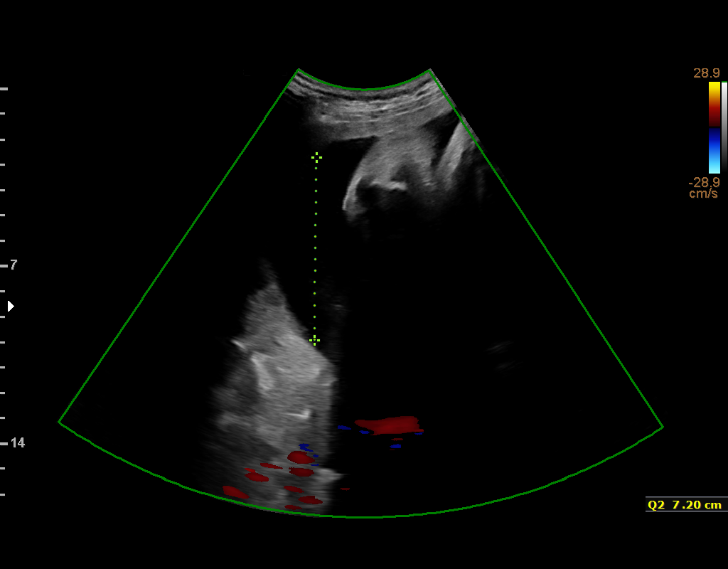
[im 27/38]
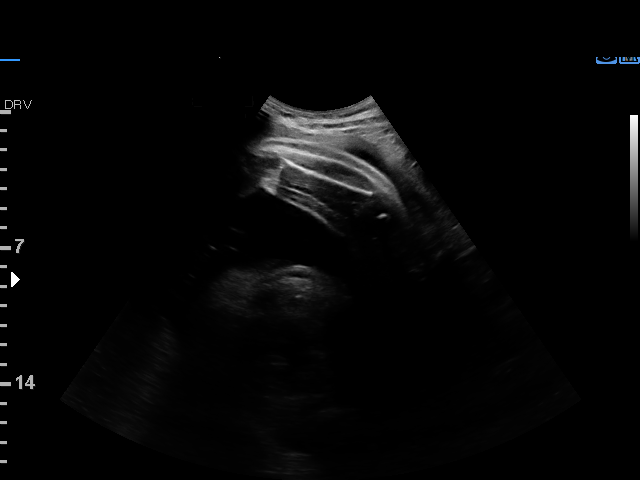
[im 29/38]
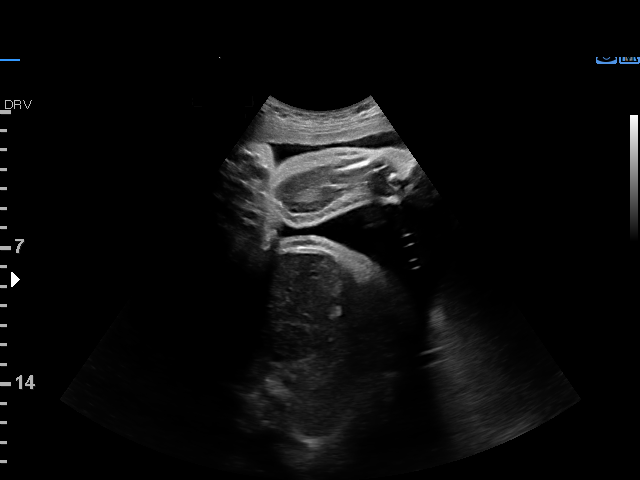
[im 32/38]
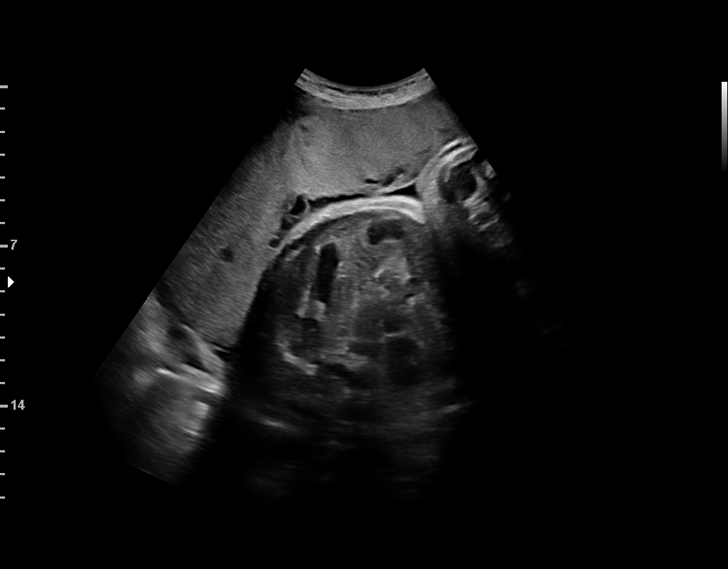
[im 35/38]
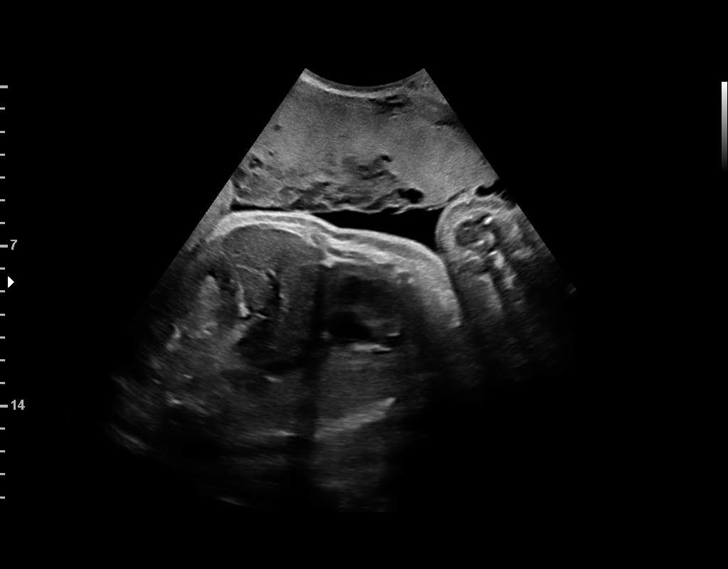
[im 38/38]
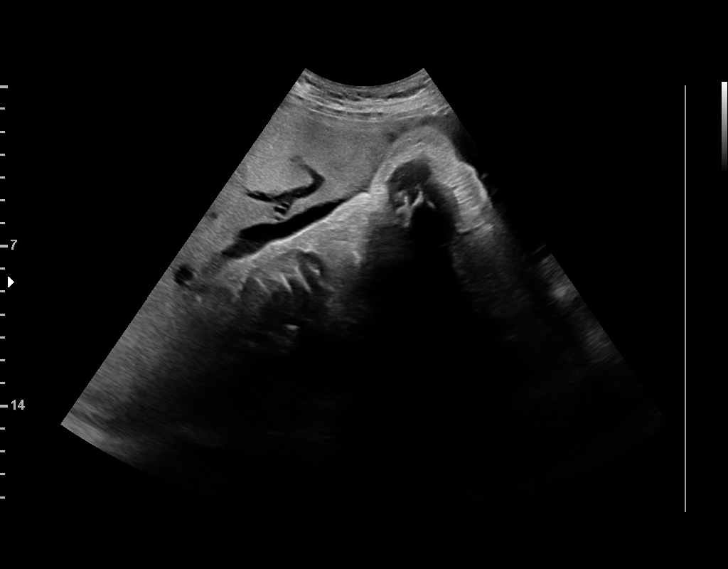

[14 of 28 positions shown; findings below may reference images not displayed]

Performed By:     Nizthar Ex             Secondary Phy.:   [[id number]]
Ref. Address:     [REDACTED]

1  HANO DAW            [id number]      [id number]     [id number]
Indications

34 weeks gestation of pregnancy
Pregnancy resulting from assisted
reproductive technology (IVF)
Poor obstetric history: Prior fetal
macrosomia, antepartum; G2; 13+8
History of cesarean delivery, currently
pregnant
Poor obstetric history: Previous preterm
delivery, antepartum @ 73wks
Umbilical vein abnormality complicating
pregnancy (UVV)
Pre-existing diabetes, type 1, in pregnancy,
third trimester (insulin pump)
OB History

Gravidity:    4         Term:   1        Prem:   1
Living:       2
Fetal Evaluation

Num Of Fetuses:     1
Fetal Heart         120
Rate(bpm):
Cardiac Activity:   Observed
Presentation:       Cephalic
Placenta:           Anterior, above cervical os
Amniotic Fluid
AFI FV:      Subjectively within normal limits

AFI Sum(cm)     %Tile       Largest Pocket(cm)
18.95           70

RUQ(cm)       RLQ(cm)       LUQ(cm)        LLQ(cm)
4.56
Biophysical Evaluation

Amniotic F.V:   Within normal limits       F. Tone:        Observed
F. Movement:    Observed                   Score:          [DATE]
F. Breathing:   Observed
Gestational Age

Best:          34w 5d     Det. By:  D.O. Conception          EDD:   08/16/17
Anatomy

Stomach:               Appears normal, left   Bladder:                Appears normal
sided
Abdomen:               Umbilical vein
varix = 1.2cm
Cervix Uterus Adnexa

Cervix
Not visualized (advanced GA >52wks)
Impression

Single living intrauterine pregnancy at 34w 5d.
Cephalic presentation.
Normal amniotic fluid volume.
UVV is again seen
no filling defect on color angiography
no hydrops
BPP [DATE].
Recommendations

Continue twice weekly BPP/hydrops evaluations/UVV color
angiography.
Delivery by 37 weeks if not indicated in the interim.

## 2019-03-29 IMAGING — US US MFM FETAL BPP W/O NON-STRESS
1 series · 15 of 28 positions shown · non-contrast
Comparison: none

[Series 1: us mfm fetal bpp w/o non-stress · 30 acquisitions, 15 frames shown]
[im 1/30]
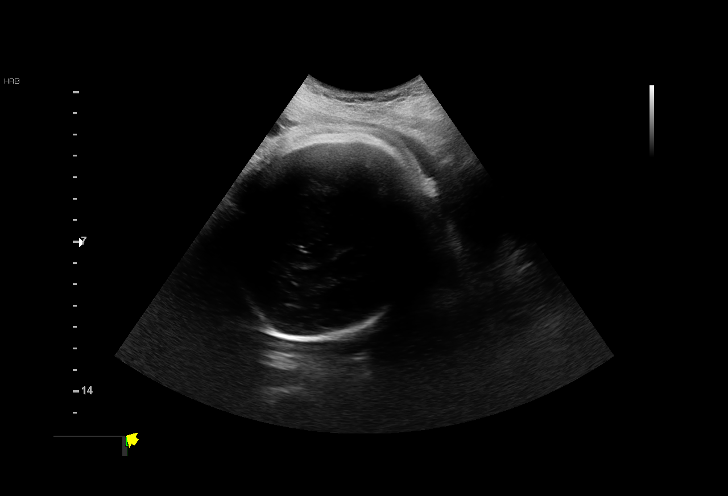
[im 3/30]
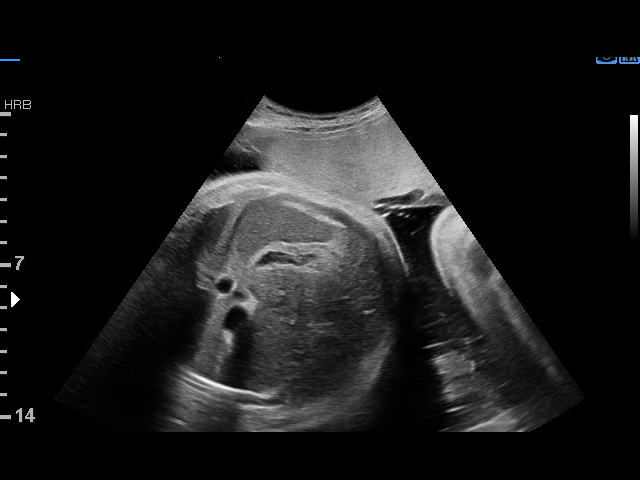
[im 5/30]
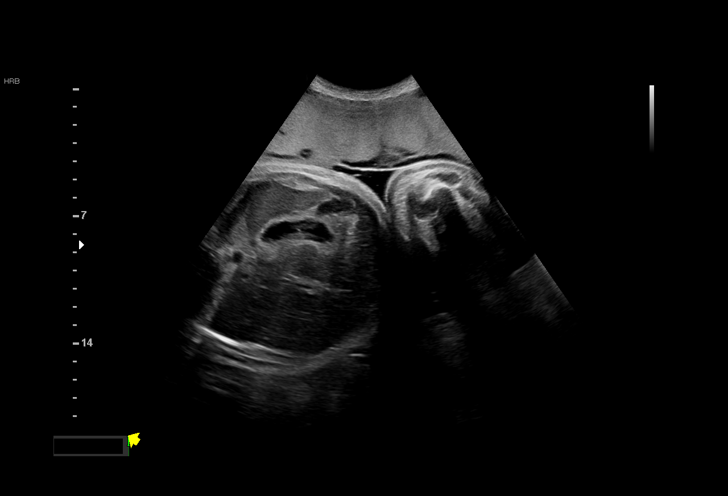
[im 7/30]
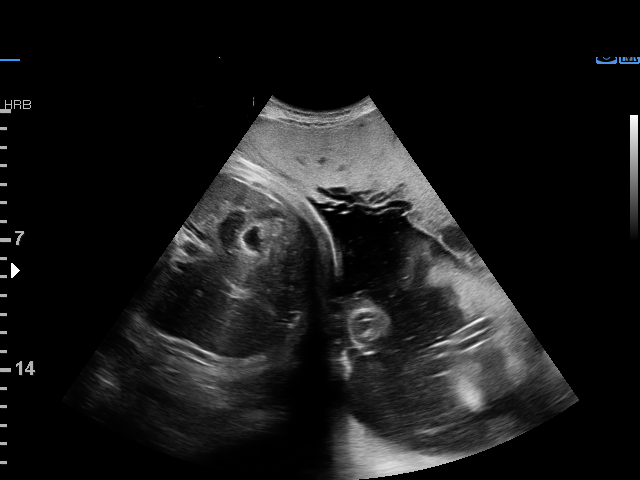
[im 9/30]
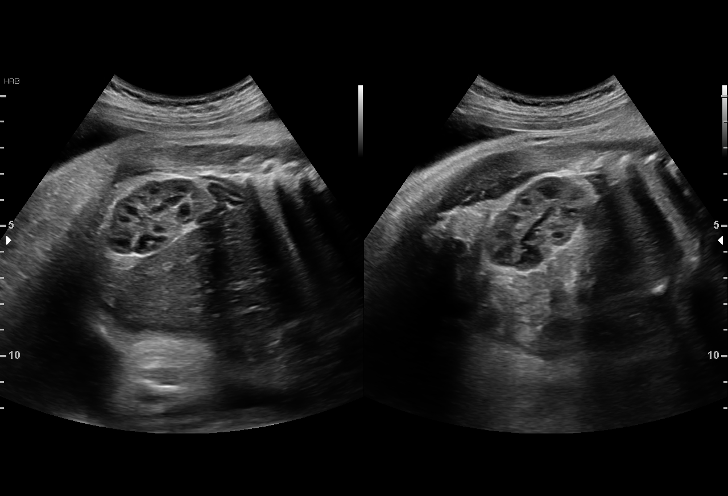
[im 11/30]
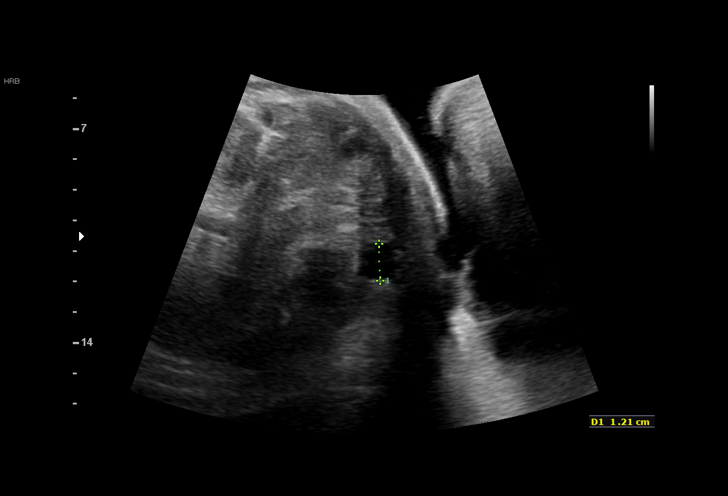
[im 13/30]
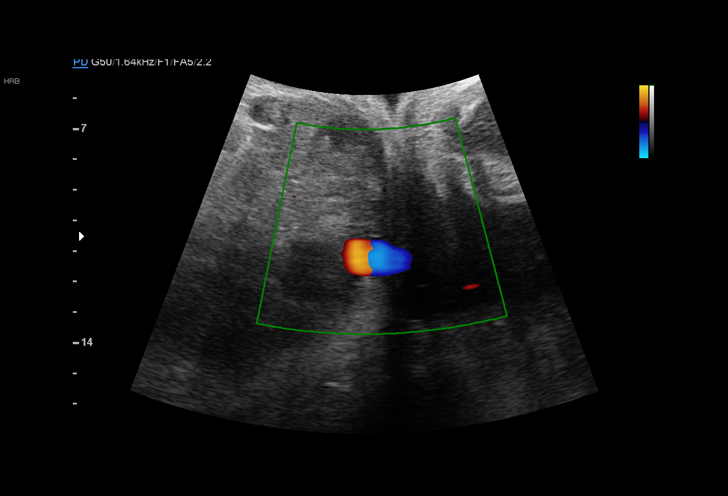
[im 16/30]
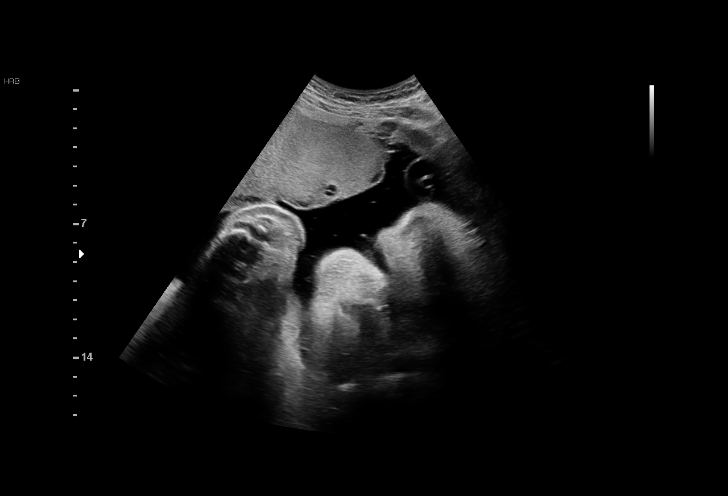
[im 17/30]
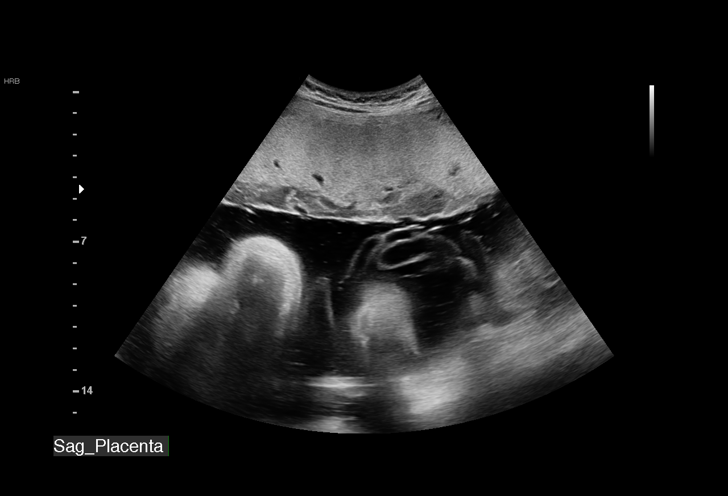
[im 19/30]
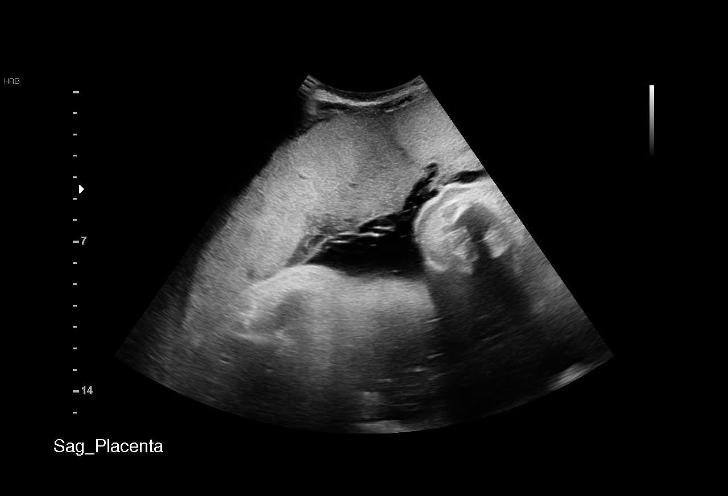
[im 21/30]
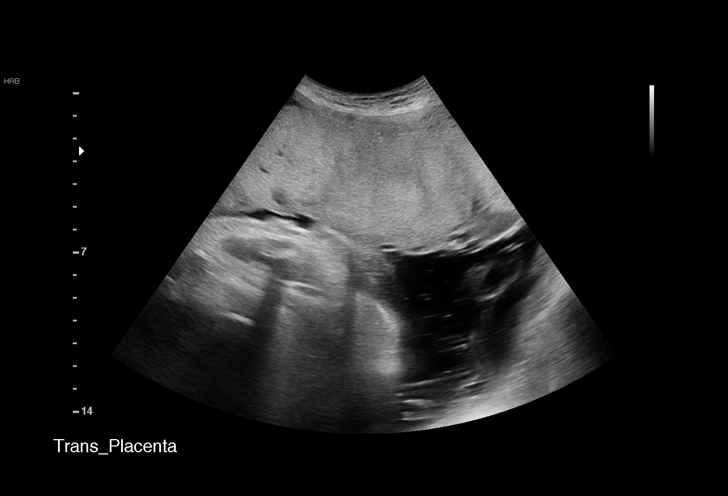
[im 23/30]
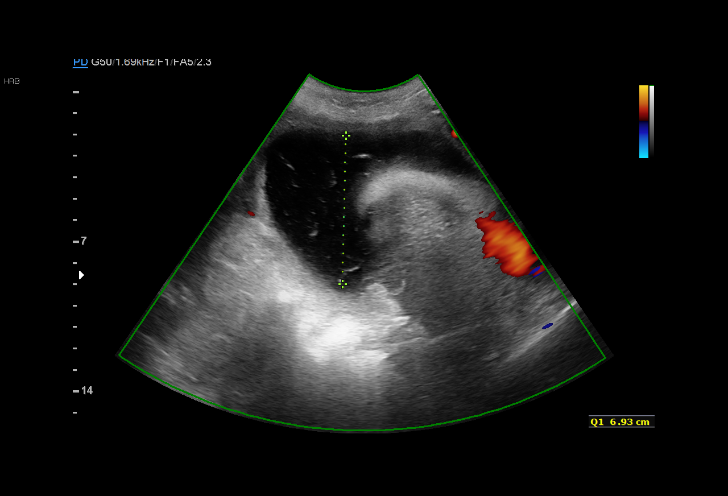
[im 25/30]
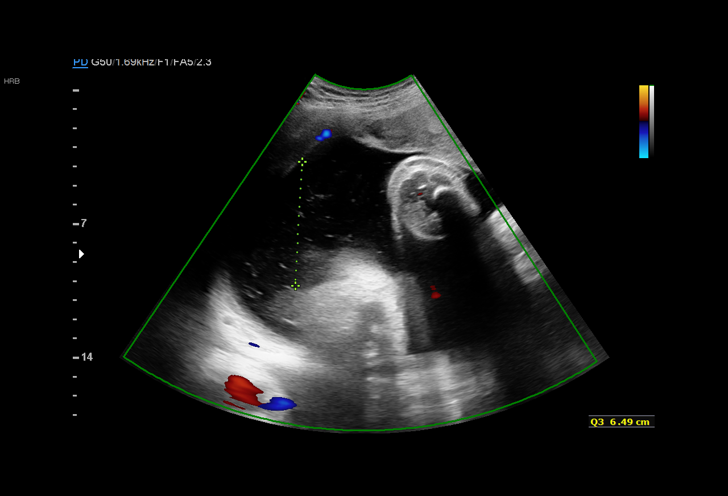
[im 27/30]
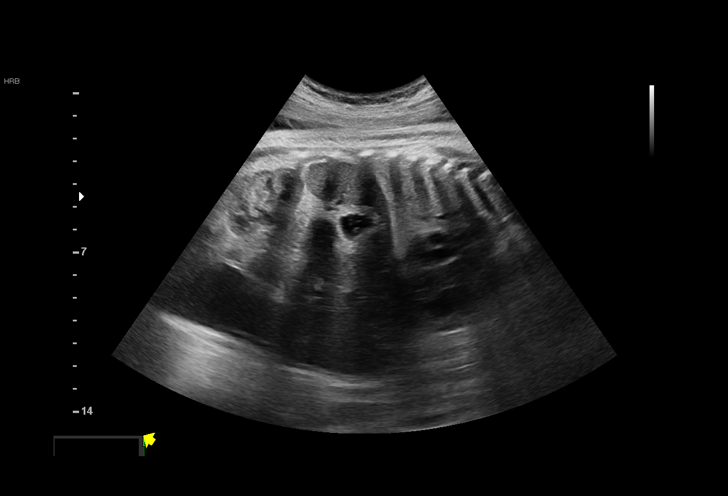
[im 30/30]
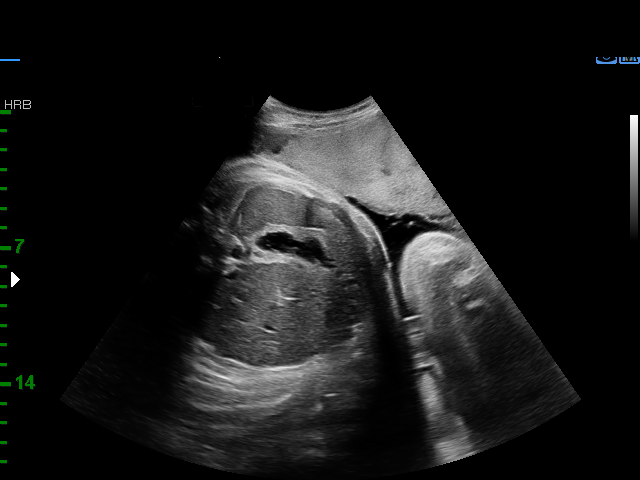

[15 of 28 positions shown; findings below may reference images not displayed]

Performed By:     Huljusija Pezejac        Secondary Phy.:   [REDACTED]
Ref. Address:     [REDACTED]

1  LALIT JACOBUS            [id number]      [id number]     [id number]
Indications

35 weeks gestation of pregnancy
Pregnancy resulting from assisted
reproductive technology (IVF)
Poor obstetric history: Prior fetal
macrosomia, antepartum; G2; 13+8
History of cesarean delivery, currently
pregnant
Poor obstetric history: Previous preterm
delivery, antepartum @ 23wks
Umbilical vein abnormality complicating
pregnancy (UVV)
Pre-existing diabetes, type 1, in pregnancy,
third trimester (insulin pump)
OB History

Gravidity:    4         Term:   1        Prem:   1
Living:       2
Fetal Evaluation

Num Of Fetuses:     1
Fetal Heart         134
Rate(bpm):
Cardiac Activity:   Observed
Presentation:       Cephalic
Placenta:           Anterior, above cervical os
P. Cord Insertion:  Previously Visualized
Amniotic Fluid
AFI FV:      Subjectively upper-normal

AFI Sum(cm)     %Tile       Largest Pocket(cm)
20.08           75

RUQ(cm)       RLQ(cm)       LUQ(cm)        LLQ(cm)
6.93
Biophysical Evaluation

Amniotic F.V:   Within normal limits       F. Tone:        Observed
F. Movement:    Observed                   Score:          [DATE]
F. Breathing:   Observed
Gestational Age

Best:          35w 5d     Det. By:  D.O. Conception          EDD:   08/16/17
Anatomy

Thoracic:              Appears normal         Abdomen:                Appears normal
Diaphragm:             Appears normal         Cord Vessels:           UVV 1.21cm
Stomach:               Appears normal, left
sided
Cervix Uterus Adnexa

Cervix
Not visualized (advanced GA >30wks)
Impression

SIUP at 35+5 weeks
Cephalic presentation
UVV- 1.2cm
No filling defect seen
Normal amniotic fluid volume
BPP [DATE]
Recommendations

Continue twice weekly surveillance
Growth in 2 weeks (last growth [DATE])

## 2019-04-05 IMAGING — US US MFM OB FOLLOW-UP
1 series · 13 of 28 positions shown · non-contrast
Comparison: none

[Series 1: us mfm ob follow-up · 48 acquisitions, 13 frames shown]
[im 2/48]
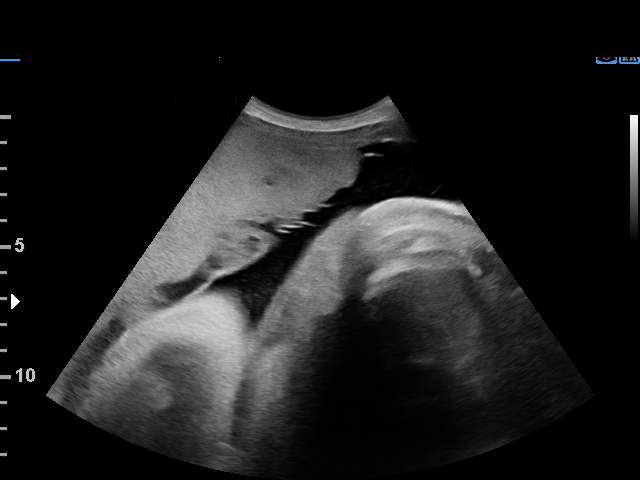
[im 6/48]
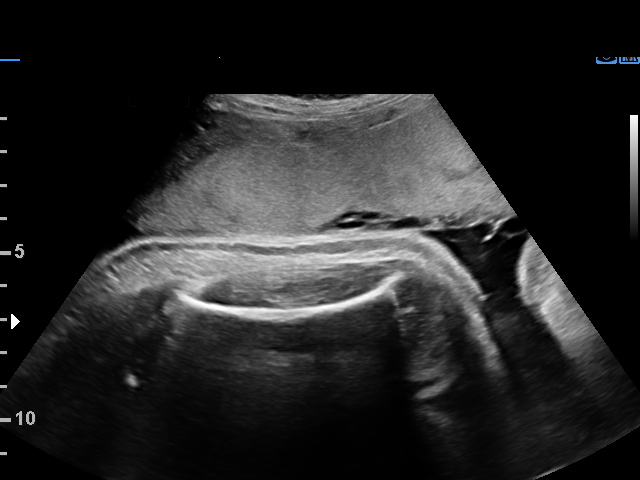
[im 9/48]
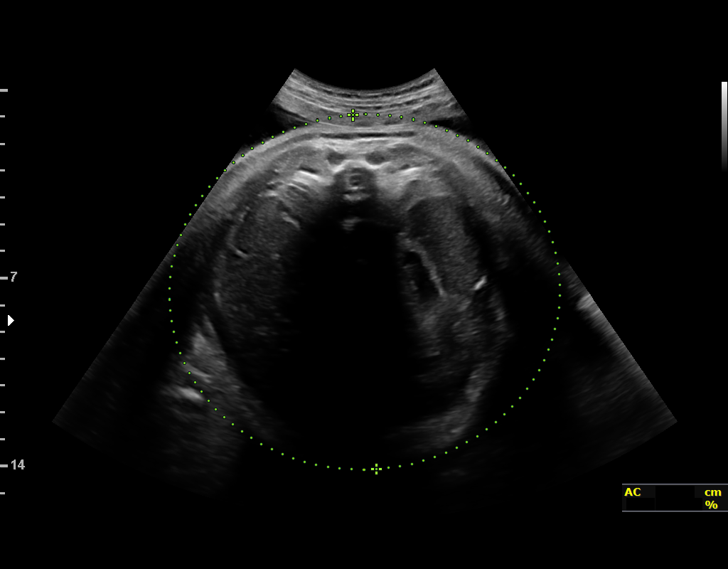
[im 13/48]
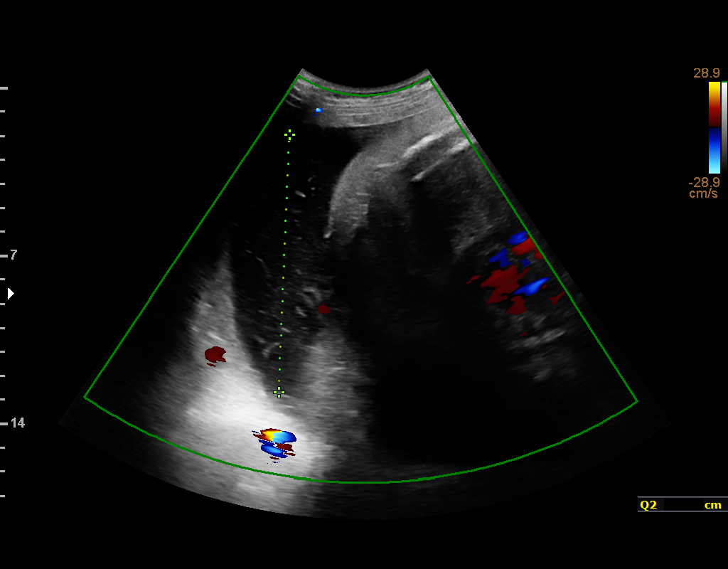
[im 16/48]
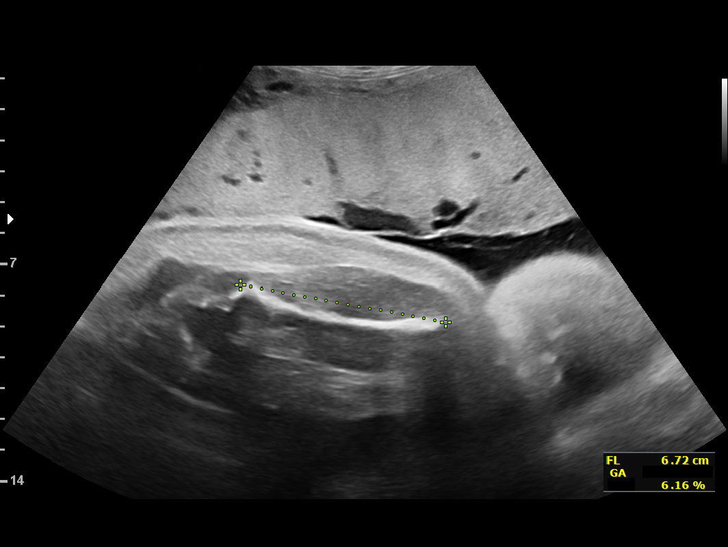
[im 20/48]
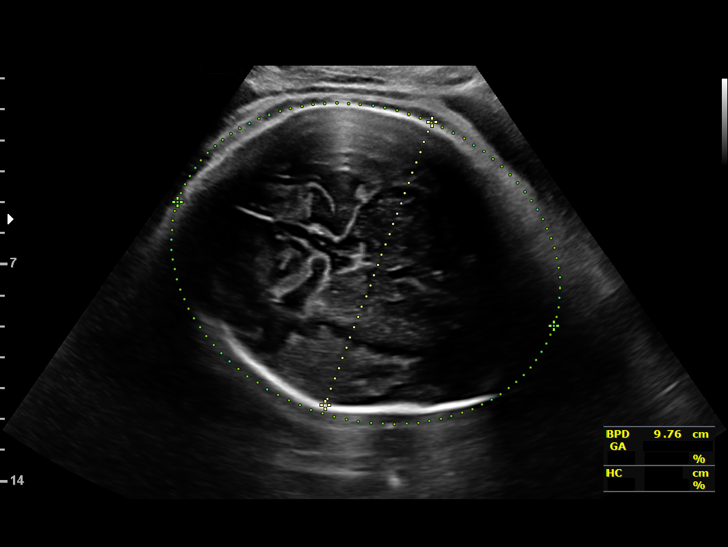
[im 25/48]
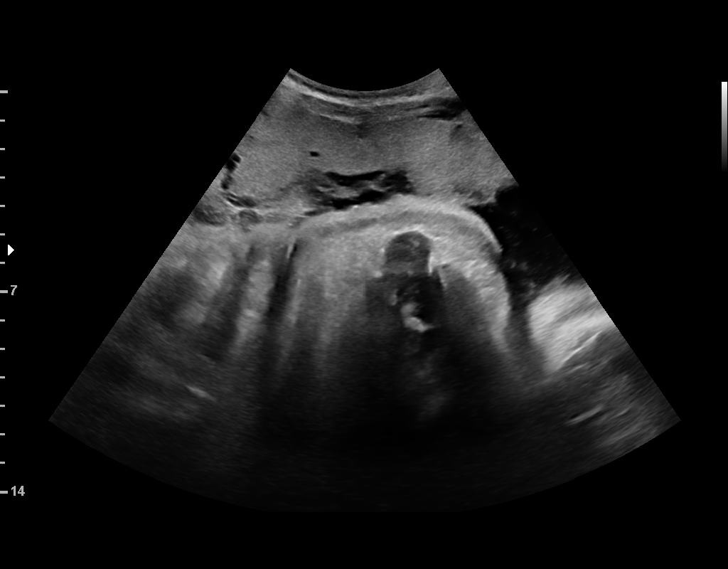
[im 28/48]
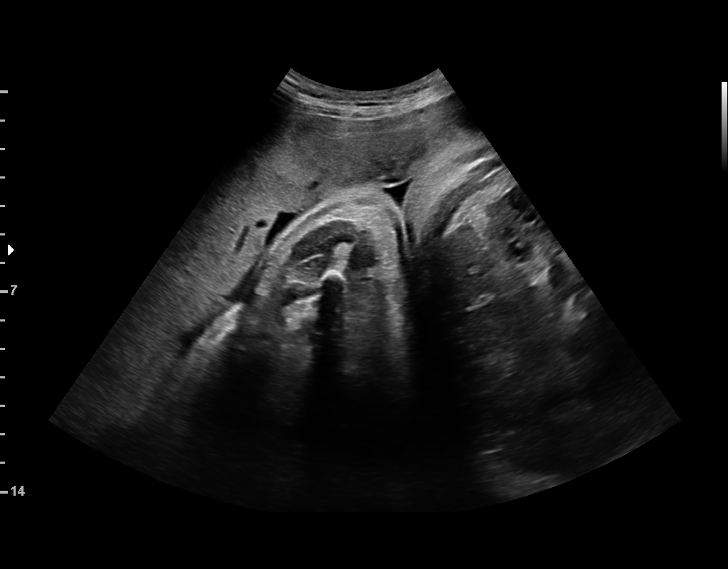
[im 32/48]
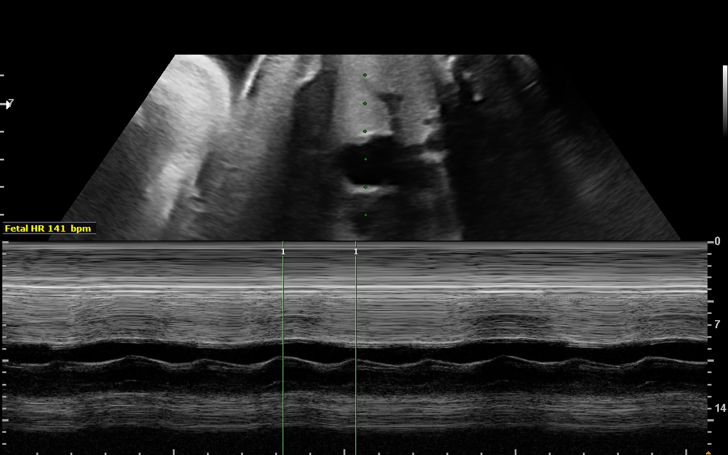
[im 35/48]
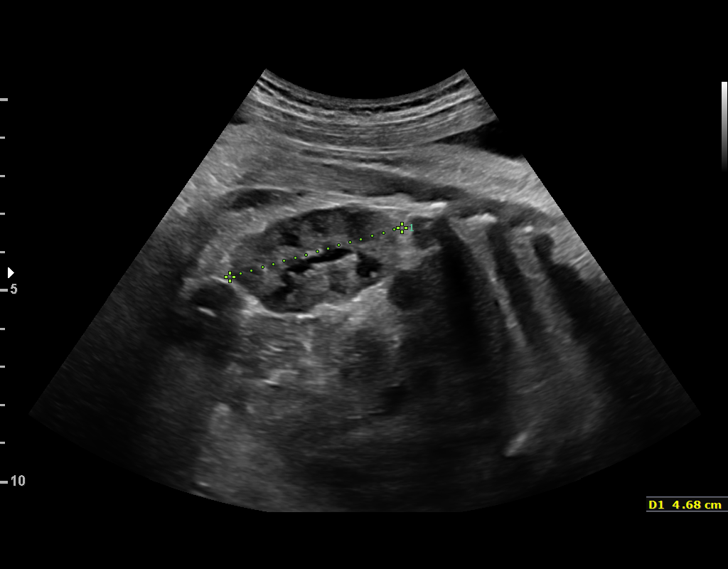
[im 39/48]
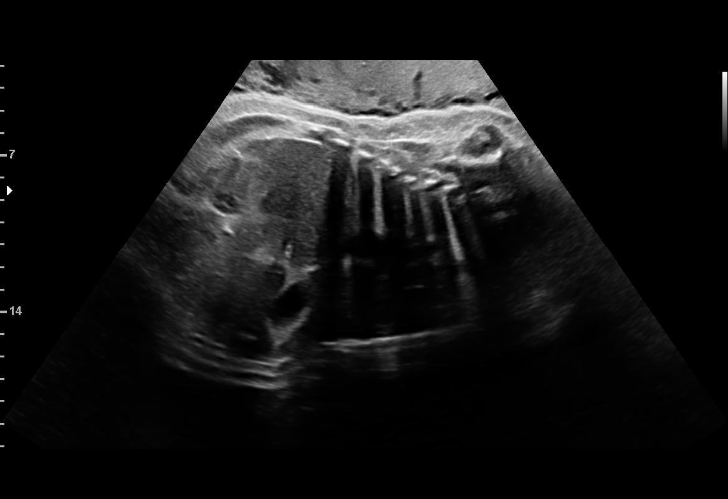
[im 42/48]
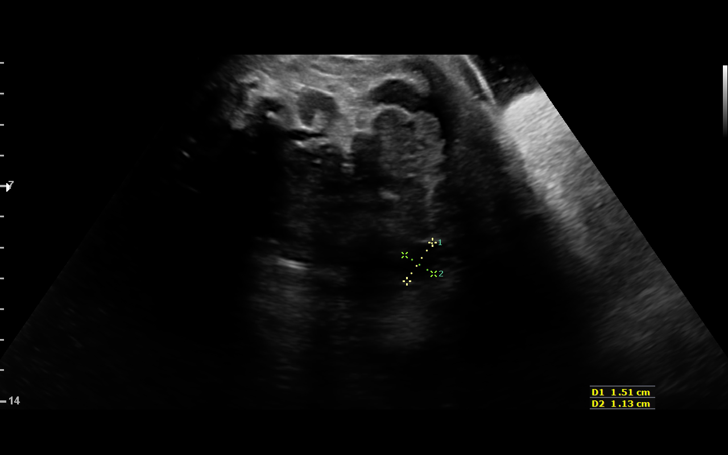
[im 46/48]
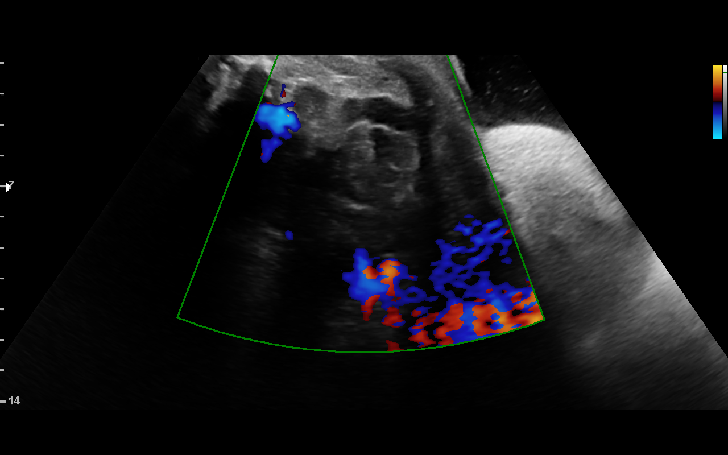

[13 of 28 positions shown; findings below may reference images not displayed]

Performed By:     Ernest Majano          Secondary Phy.:   [REDACTED]
Ref. Address:     [REDACTED]

1  BLAIN JUMPER            [id number]      [id number]     [id number]
2  Edwin William Ga           [id number]      [id number]     [id number]
Indications

36 weeks gestation of pregnancy
Pregnancy resulting from assisted
reproductive technology (IVF)
Poor obstetric history: Prior fetal
macrosomia, antepartum; G2; 13+8
History of cesarean delivery, currently
pregnant
Poor obstetric history: Previous preterm
delivery, antepartum @ 96wks
Umbilical vein abnormality complicating
pregnancy (UVV)
Pre-existing diabetes, type 1, in pregnancy,
third trimester (insulin pump)
OB History

Blood Type:            Height:  5'8"   Weight (lb):  195       BMI:
Gravidity:    4         Term:   1        Prem:   1
Living:       2
Fetal Evaluation

Num Of Fetuses:     1
Fetal Heart         141
Rate(bpm):
Cardiac Activity:   Observed
Presentation:       Cephalic
Placenta:           Anterior, above cervical os
P. Cord Insertion:  Visualized

Amniotic Fluid
AFI FV:      Subjectively within normal limits

AFI Sum(cm)     %Tile
27.51           97

RUQ(cm)       RLQ(cm)       LUQ(cm)        LLQ(cm)
5.79
Biophysical Evaluation

Amniotic F.V:   Within normal limits       F. Tone:        Observed
F. Movement:    Observed                   Score:          [DATE]
F. Breathing:   Observed
Biometry

BPD:      97.4  mm     G. Age:  39w 6d       > 99  %    CI:         75.9   %    70 - 86
FL/HC:      18.4   %    20.8 -
HC:      354.4  mm     G. Age:  41w 3d       > 97  %    HC/AC:      0.83        0.92 -
AC:      428.2  mm     G. Age:  N/A          > 97  %    FL/BPD:     67.0   %    71 - 87
FL:       65.3  mm     G. Age:  33w 5d        < 3  %    FL/AC:      15.2   %    20 - 24

Est. FW:    7197  gm    11 lb 6 oz    > 90  %
Gestational Age

U/S Today:     38w 2d                                        EDD:   08/05/17
Best:          36w 5d     Det. By:  D.O. Conception          EDD:   08/16/17
Anatomy

Cranium:               Appears normal         Aortic Arch:            Previously seen
Cavum:                 Previously seen        Ductal Arch:            Previously seen
Ventricles:            Previously seen        Diaphragm:              Appears normal
Choroid Plexus:        Previously seen        Stomach:                Appears normal, left
sided
Cerebellum:            Previously seen        Abdomen:                Umbilical vein
varix (1.6)
Posterior Fossa:       Previously seen        Abdominal Wall:         Previously seen
Nuchal Fold:           Previously seen        Cord Vessels:           Previously seen
Face:                  Orbits and profile     Kidneys:                Appear normal
previously seen
Lips:                  Previously seen        Bladder:                Appears normal
Thoracic:              Appears normal         Spine:                  Previously seen
Heart:                 Previously seen        Upper Extremities:      Previously seen
RVOT:                  Previously seen        Lower Extremities:      Previously seen
LVOT:                  Previously seen

Other:  Male gender. Heels previously visualized.
Impression

SIUP at 36+5 weeks
Cephalic presentation
UVV - no filling defects
Normal interval anatomy; anatomic survey complete
Normal amniotic fluid volume
EFW > 90th %tile; AC > 97th %tile; 11+6; 7197 grams
BPP [DATE]
Recommendations

Repeat C/S planned for [DATE]

## 2019-09-23 ENCOUNTER — Other Ambulatory Visit: Payer: Self-pay

## 2019-09-23 ENCOUNTER — Encounter: Payer: Self-pay | Admitting: Plastic Surgery

## 2019-09-23 ENCOUNTER — Ambulatory Visit: Payer: Managed Care, Other (non HMO) | Admitting: Plastic Surgery

## 2019-09-23 DIAGNOSIS — G8929 Other chronic pain: Secondary | ICD-10-CM

## 2019-09-23 DIAGNOSIS — Z719 Counseling, unspecified: Secondary | ICD-10-CM | POA: Insufficient documentation

## 2019-09-23 DIAGNOSIS — M793 Panniculitis, unspecified: Secondary | ICD-10-CM | POA: Insufficient documentation

## 2019-09-23 DIAGNOSIS — M545 Low back pain: Secondary | ICD-10-CM

## 2019-09-23 NOTE — Progress Notes (Signed)
Patient ID: Karen Alexander, female    DOB: 1985/09/03, 34 y.o.   MRN: [id number]   Chief Complaint  Patient presents with  . Consult    The patient is a very sweet 34 year old white female here for consultation for her abdomen.  She has type 1 diabetes and has an insulin pump.  She had 2 C-sections.  The last one was at least 2 years ago.  She has not had any other abdominal surgery.  She is not a smoker and is otherwise in very good health.  Her last hemoglobin A1c was 6.9.  She is due for another check in the next month.  She is 5 feet 8 inches tall and weighs 151 pounds.  She has excess fat on the lower abdominal area with some asymmetry.  She noticed this after her C-sections.  She has mild diastases but no hernia.   Review of Systems  Constitutional: Negative.  Negative for activity change and appetite change.  HENT: Negative.   Eyes: Negative.   Respiratory: Negative.  Negative for chest tightness and shortness of breath.   Cardiovascular: Negative.  Negative for leg swelling.  Gastrointestinal: Negative.  Negative for abdominal distention, abdominal pain and anal bleeding.  Endocrine: Negative.   Genitourinary: Negative.   Musculoskeletal: Negative for back pain.  Skin: Negative for color change and rash.  Hematological: Negative.   Psychiatric/Behavioral: Negative.     Past Medical History:  Diagnosis Date  . Diabetes mellitus    diagnosed at age 34  Type 1 per Patient  . Missed ab 07/31/2014   11 wks D & E    Past Surgical History:  Procedure Laterality Date  . CESAREAN SECTION N/A 09/05/2015   Procedure: CESAREAN SECTION;  Surgeon: Adam Phenix, MD;  Location: [facility]THING [facility];  Service: Obstetrics;  Laterality: N/A;  . CESAREAN SECTION N/A 07/27/2017   Procedure: REPEAT CESAREAN SECTION;  Surgeon: Franklin Bing, MD;  Location: [facility] BIRTHING SUITES;  Service: Obstetrics;  Laterality: N/A;  . DILATION AND EVACUATION N/A 07/31/2014   Procedure: DILATATION AND  EVACUATION;  Surgeon: Catalina Antigua, MD;  Location: [facility];  Service: Gynecology;  Laterality: N/A;  . egg retrieval  August  . WISDOM TOOTH EXTRACTION  2012   x 4      Current Outpatient Medications:  .  Aspirin 81 MG CAPS, , Disp: , Rfl:  .  B-D ULTRA-FINE 33 LANCETS MISC, , Disp: , Rfl:  .  glucose blood (BAYER CONTOUR NEXT TEST) test strip, Reported on 07/31/2015, Disp: , Rfl:  .  insulin lispro (HUMALOG) 100 UNIT/ML injection, USE AS DIRECTED IN INSULIN PUMP, Disp: , Rfl:  .  loratadine (CLARITIN) 10 MG tablet, Take 10 mg by mouth daily., Disp: , Rfl:  .  Multiple Vitamin (MULTI-VITAMIN) tablet, Take 1 tablet by mouth daily., Disp: , Rfl:  .  Prenatal Vit-Fe Fumarate-FA (MULTIVITAMIN-PRENATAL) 27-0.8 MG TABS tablet, Take 1 tablet by mouth daily. , Disp: , Rfl:    Objective:   Vitals:   09/23/19 1115  BP: 124/81  Pulse: 80  Temp: 98.2 F (36.8 C)  SpO2: 99%    Physical Exam Vitals and nursing note reviewed.  Constitutional:      Appearance: Normal appearance.  HENT:     Head: Normocephalic and atraumatic.  Eyes:     Extraocular Movements: Extraocular movements intact.  Cardiovascular:     Rate and Rhythm: Normal rate.     Pulses: Normal pulses.  Pulmonary:  Effort: Pulmonary effort is normal.  Abdominal:     General: Abdomen is flat. There is no distension.     Palpations: There is no mass.     Tenderness: There is no abdominal tenderness.     Hernia: No hernia is present.  Skin:    General: Skin is warm.     Capillary Refill: Capillary refill takes less than 2 seconds.  Neurological:     General: No focal deficit present.     Mental Status: She is alert and oriented to person, place, and time.  Psychiatric:        Mood and Affect: Mood normal.        Behavior: Behavior normal.        Thought Content: Thought content normal.     Assessment & Plan:  Encounter for counseling  The patient is a good candidate for a mini abdominoplasty with liposuction.    The caution I have for her is with her diabetes.  I would like to have her think about her comfort level for risk.  Mostly it would be infection due to her diabetes.  We talked about perhaps starting with liposuction and then intraoperatively deciding on skin excision or mini abdominoplasty.  She is going to think it over.  We have provided her with a quote and we will wait to hear back from her.  Pictures were obtained of the patient and placed in the chart with the patient's or guardian's permission.   Alena Bills Essa Wenk, DO

## 2019-10-31 ENCOUNTER — Ambulatory Visit (INDEPENDENT_AMBULATORY_CARE_PROVIDER_SITE_OTHER): Payer: Managed Care, Other (non HMO) | Admitting: Obstetrics & Gynecology

## 2019-10-31 ENCOUNTER — Encounter: Payer: Self-pay | Admitting: Obstetrics & Gynecology

## 2019-10-31 ENCOUNTER — Other Ambulatory Visit (HOSPITAL_COMMUNITY)
Admission: RE | Admit: 2019-10-31 | Discharge: 2019-10-31 | Disposition: A | Discharge status: Home / Self Care | Payer: Managed Care, Other (non HMO) | Source: Ambulatory Visit | Attending: Obstetrics & Gynecology | Admitting: Obstetrics & Gynecology

## 2019-10-31 ENCOUNTER — Other Ambulatory Visit: Payer: Self-pay

## 2019-10-31 VITALS — BP 110/75 | HR 80 | Wt 145.6 lb

## 2019-10-31 DIAGNOSIS — Z01419 Encounter for gynecological examination (general) (routine) without abnormal findings: Secondary | ICD-10-CM | POA: Diagnosis present

## 2019-10-31 DIAGNOSIS — Z975 Presence of (intrauterine) contraceptive device: Secondary | ICD-10-CM | POA: Diagnosis not present

## 2019-10-31 NOTE — Progress Notes (Signed)
GYNECOLOGY ANNUAL PREVENTATIVE CARE ENCOUNTER NOTE  History:     Karen Alexander is a 34 y.o. [phone] female here for a routine annual gynecologic exam.  Current complaints: none.   Denies abnormal vaginal bleeding, discharge, pelvic pain, problems with intercourse or other gynecologic concerns.    Gynecologic History No LMP recorded. (Menstrual status: IUD). Contraception: Mirena placed in 2019. Last Pap: 2019. Results were: normal with negative HPV  Obstetric History OB History  Gravida Para Term Preterm AB Living  4 3 2 1 1 3   SAB TAB Ectopic Multiple Live Births  1 0 0 0 3    # Outcome Date GA Lbr Len/2nd Weight Sex Delivery Anes PTL Lv  4 Term 07/27/17 [redacted]w[redacted]d  10 lb 9 oz (4.79 kg) M CS-LTranv Spinal  LIV  3 Term 09/05/15 [redacted]w[redacted]d  13 lb 8.8 oz (6.145 kg) M CS-LTranv Spinal  LIV  2 Preterm 05/27/13 [redacted]w[redacted]d 02:39 / 00:50 9 lb 1.3 oz (4.12 kg) M Vag-Spont EPI  LIV  1 SAB             Past Medical History:  Diagnosis Date  . Diabetes mellitus    diagnosed at age 53  Type 1 per Patient  . Missed ab 07/31/2014   11 wks D & E    Past Surgical History:  Procedure Laterality Date  . CESAREAN SECTION N/A 09/05/2015   Procedure: CESAREAN SECTION;  Surgeon: 09/07/2015, MD;  Location: [facility] BIRTHING SUITES;  Service: Obstetrics;  Laterality: N/A;  . CESAREAN SECTION N/A 07/27/2017   Procedure: REPEAT CESAREAN SECTION;  Surgeon: 09/26/2017, MD;  Location: [facility] BIRTHING SUITES;  Service: Obstetrics;  Laterality: N/A;  . DILATION AND EVACUATION N/A 07/31/2014   Procedure: DILATATION AND EVACUATION;  Surgeon: 08/02/2014, MD;  Location: [facility];  Service: Gynecology;  Laterality: N/A;  . egg retrieval  August  . WISDOM TOOTH EXTRACTION  2012   x 4    Current Outpatient Medications on File Prior to Visit  Medication Sig Dispense Refill  . Aspirin 81 MG CAPS     . calcium-vitamin D (OSCAL WITH D) 500-200 MG-UNIT tablet Take 1 tablet by mouth.    . Insulin Human (INSULIN PUMP)  SOLN Inject into the skin.    2013 insulin lispro (HUMALOG) 100 UNIT/ML injection USE AS DIRECTED IN INSULIN PUMP    . loratadine (CLARITIN) 10 MG tablet Take 10 mg by mouth daily.    . Multiple Vitamin (MULTI-VITAMIN) tablet Take 1 tablet by mouth daily.    . vitamin C (ASCORBIC ACID) 250 MG tablet Take 250 mg by mouth 2 (two) times daily.    . B-D ULTRA-FINE 33 LANCETS MISC     . glucose blood (BAYER CONTOUR NEXT TEST) test strip Reported on 07/31/2015    . Prenatal Vit-Fe Fumarate-FA (MULTIVITAMIN-PRENATAL) 27-0.8 MG TABS tablet Take 1 tablet by mouth daily.      No current facility-administered medications on file prior to visit.    No Known Allergies  Social History:  reports that she has never smoked. She has never used smokeless tobacco. She reports that she does not drink alcohol and does not use drugs.  Family History  Problem Relation Age of Onset  . Gout Father   . Hypertension Father   . Diabetes Maternal Grandmother        type2  . Hypothyroidism Paternal Grandmother   . Thyroid disease Paternal Grandmother   . Diabetes Paternal Grandfather  type 2  . Hypertension Paternal Grandfather     The following portions of the patient's history were reviewed and updated as appropriate: allergies, current medications, past family history, past medical history, past social history, past surgical history and problem list.  Review of Systems Pertinent items noted in HPI and remainder of comprehensive ROS otherwise negative.  Physical Exam:  BP 110/75   Pulse 80   Wt 145 lb 9.6 oz (66 kg)   BMI 22.14 kg/m  CONSTITUTIONAL: Well-developed, well-nourished female in no acute distress.  HENT:  Normocephalic, atraumatic, External right and left ear normal. Oropharynx is clear and moist EYES: Conjunctivae and EOM are normal. Pupils are equal, round, and reactive to light. No scleral icterus.  NECK: Normal range of motion, supple, no masses.  Normal thyroid.  SKIN: Skin is warm  and dry. No rash noted. Not diaphoretic. No erythema. No pallor. MUSCULOSKELETAL: Normal range of motion. No tenderness.  No cyanosis, clubbing, or edema.  2+ distal pulses. NEUROLOGIC: Alert and oriented to person, place, and time. Normal reflexes, muscle tone coordination.  PSYCHIATRIC: Normal mood and affect. Normal behavior. Normal judgment and thought content. CARDIOVASCULAR: Normal heart rate noted, regular rhythm RESPIRATORY: Clear to auscultation bilaterally. Effort and breath sounds normal, no problems with respiration noted. BREASTS: Symmetric in size. No masses, tenderness, skin changes, nipple drainage, or lymphadenopathy bilaterally. Performed in the presence of a chaperone. ABDOMEN: Soft, no distention noted.  No tenderness, rebound or guarding.  PELVIC: Normal appearing external genitalia and urethral meatus; normal appearing vaginal mucosa and cervix.  No abnormal discharge noted.  Pap smear obtained.  Normal uterine size, no other palpable masses, no uterine or adnexal tenderness.  Performed in the presence of a chaperone.   Assessment and Plan:    1. IUD (intrauterine device) in place Can keep in place for seven years.  2. Well woman exam with routine gynecological exam - Cytology - PAP Will follow up results of pap smear and manage accordingly. Routine preventative health maintenance measures emphasized. Please refer to After Visit Summary for other counseling recommendations.      Jaynie Collins, MD, FACOG Obstetrician & Gynecologist, [facility] for [employer], [facility]

## 2019-10-31 NOTE — Patient Instructions (Signed)
Preventive Care 21-36 Years Old, Female Preventive care refers to visits with your health care provider and lifestyle choices that can promote health and wellness. This includes:  A yearly physical exam. This may also be called an annual well check.  Regular dental visits and eye exams.  Immunizations.  Screening for certain conditions.  Healthy lifestyle choices, such as eating a healthy diet, getting regular exercise, not using drugs or products that contain nicotine and tobacco, and limiting alcohol use. What can I expect for my preventive care visit? Physical exam Your health care provider will check your:  Height and weight. This may be used to calculate body mass index (BMI), which tells if you are at a healthy weight.  Heart rate and blood pressure.  Skin for abnormal spots. Counseling Your health care provider may ask you questions about your:  Alcohol, tobacco, and drug use.  Emotional well-being.  Home and relationship well-being.  Sexual activity.  Eating habits.  Work and work environment.  Method of birth control.  Menstrual cycle.  Pregnancy history. What immunizations do I need?  Influenza (flu) vaccine  This is recommended every year. Tetanus, diphtheria, and pertussis (Tdap) vaccine  You may need a Td booster every 10 years. Varicella (chickenpox) vaccine  You may need this if you have not been vaccinated. Human papillomavirus (HPV) vaccine  If recommended by your health care provider, you may need three doses over 6 months. Measles, mumps, and rubella (MMR) vaccine  You may need at least one dose of MMR. You may also need a second dose. Meningococcal conjugate (MenACWY) vaccine  One dose is recommended if you are age 19-21 years and a first-year college student living in a residence hall, or if you have one of several medical conditions. You may also need additional booster doses. Pneumococcal conjugate (PCV13) vaccine  You may need  this if you have certain conditions and were not previously vaccinated. Pneumococcal polysaccharide (PPSV23) vaccine  You may need one or two doses if you smoke cigarettes or if you have certain conditions. Hepatitis A vaccine  You may need this if you have certain conditions or if you travel or work in places where you may be exposed to hepatitis A. Hepatitis B vaccine  You may need this if you have certain conditions or if you travel or work in places where you may be exposed to hepatitis B. Haemophilus influenzae type b (Hib) vaccine  You may need this if you have certain conditions. You may receive vaccines as individual doses or as more than one vaccine together in one shot (combination vaccines). Talk with your health care provider about the risks and benefits of combination vaccines. What tests do I need?  Blood tests  Lipid and cholesterol levels. These may be checked every 5 years starting at age 20.  Hepatitis C test.  Hepatitis B test. Screening  Diabetes screening. This is done by checking your blood sugar (glucose) after you have not eaten for a while (fasting).  Sexually transmitted disease (STD) testing.  BRCA-related cancer screening. This may be done if you have a family history of breast, ovarian, tubal, or peritoneal cancers.  Pelvic exam and Pap test. This may be done every 3 years starting at age 21. Starting at age 30, this may be done every 5 years if you have a Pap test in combination with an HPV test. Talk with your health care provider about your test results, treatment options, and if necessary, the need for more tests.   Follow these instructions at home: Eating and drinking   Eat a diet that includes fresh fruits and vegetables, whole grains, lean protein, and low-fat dairy.  Take vitamin and mineral supplements as recommended by your health care provider.  Do not drink alcohol if: ? Your health care provider tells you not to drink. ? You are  pregnant, may be pregnant, or are planning to become pregnant.  If you drink alcohol: ? Limit how much you have to 0-1 drink a day. ? Be aware of how much alcohol is in your drink. In the [place] one drink equals one 12 oz bottle of beer (355 mL), one 5 oz glass of wine (148 mL), or one 1 oz glass of hard liquor (44 mL). Lifestyle  Take daily care of your teeth and gums.  Stay active. Exercise for at least 30 minutes on 5 or more days each week.  Do not use any products that contain nicotine or tobacco, such as cigarettes, e-cigarettes, and chewing tobacco. If you need help quitting, ask your health care provider.  If you are sexually active, practice safe sex. Use a condom or other form of birth control (contraception) in order to prevent pregnancy and STIs (sexually transmitted infections). If you plan to become pregnant, see your health care provider for a preconception visit. What's next?  Visit your health care provider once a year for a well check visit.  Ask your health care provider how often you should have your eyes and teeth checked.  Stay up to date on all vaccines. This information is not intended to replace advice given to you by your health care provider. Make sure you discuss any questions you have with your health care provider. Document Revised: 10/15/2017 Document Reviewed: 10/15/2017 Elsevier Patient Education  2020 [employer].

## 2019-11-03 LAB — CYTOLOGY - PAP
Comment: NEGATIVE
Diagnosis: NEGATIVE
High risk HPV: NEGATIVE

## 2019-11-25 ENCOUNTER — Ambulatory Visit (INDEPENDENT_AMBULATORY_CARE_PROVIDER_SITE_OTHER): Payer: Self-pay | Admitting: Surgical

## 2019-11-25 ENCOUNTER — Other Ambulatory Visit: Payer: Self-pay

## 2019-11-25 ENCOUNTER — Encounter: Payer: Self-pay | Admitting: Surgical

## 2019-11-25 VITALS — BP 124/77 | HR 92 | Temp 98.9°F | Ht 68.0 in | Wt 152.4 lb

## 2019-11-25 DIAGNOSIS — M545 Low back pain, unspecified: Secondary | ICD-10-CM

## 2019-11-25 DIAGNOSIS — M793 Panniculitis, unspecified: Secondary | ICD-10-CM

## 2019-11-25 DIAGNOSIS — G8929 Other chronic pain: Secondary | ICD-10-CM

## 2019-11-25 DIAGNOSIS — Z719 Counseling, unspecified: Secondary | ICD-10-CM

## 2019-11-25 MED ORDER — CEPHALEXIN 500 MG PO CAPS: 500.0000 mg | ORAL_CAPSULE | Freq: Four times a day (QID) | ORAL | 0 refills | Status: AC

## 2019-11-25 MED ORDER — ONDANSETRON HCL 4 MG PO TABS: 4.0000 mg | ORAL_TABLET | Freq: Three times a day (TID) | ORAL | 0 refills | Status: DC | PRN

## 2019-11-25 MED ORDER — HYDROCODONE-ACETAMINOPHEN 5-325 MG PO TABS
1.0000 | ORAL_TABLET | Freq: Four times a day (QID) | ORAL | 0 refills | Status: AC | PRN
Start: 2019-11-25 — End: 2019-11-30

## 2019-11-25 NOTE — Progress Notes (Signed)
Patient ID: Karen Alexander, female    DOB: 1985/08/15, 34 y.o.   MRN: [id number]  Chief Complaint  Patient presents with  . Pre-op Exam      ICD-10-CM   1. Panniculitis  M79.3   2. Chronic bilateral low back pain without sciatica  M54.50    G89.29   3. Encounter for counseling  Z71.9     History of Present Illness: Karen Alexander is a 34 y.o.  female  with a history of excess abdominal tissue with some asymmetry.  She presents for preoperative evaluation for upcoming procedure, mini abdominoplasty with liposuction, scheduled for 12/12/2019 with Dr. Ulice Bold  The patient has not had problems with anesthesia.  She believes she had anesthesia with her wisdom tooth extraction, but is unsure.  She is not aware of any family issues with anesthesia or family history of malignant hyperthermia. No history of DVT/PE.  No family history of DVT/PE.  No family or personal history of bleeding or clotting disorders.   No history of CVA/MI.  She currently takes aspirin 81 mg daily for prophylaxis  She has been feeling well lately, no recent illnesses.  No fever, chills, nausea, vomiting, chest pain, shortness of breath, she does currently have a Mirena IUD in place  Job: Surgical scheduler  PMH Significant for: Type 1 diabetes, most recent A1c 7.0.  History of 2 C-sections.   Past Medical History: Allergies: No Known Allergies  Current Medications:  Current Outpatient Medications:  .  Aspirin 81 MG CAPS, , Disp: , Rfl:  .  B-D ULTRA-FINE 33 LANCETS MISC, , Disp: , Rfl:  .  glucose blood (BAYER CONTOUR NEXT TEST) test strip, Reported on 07/31/2015, Disp: , Rfl:  .  Insulin Human (INSULIN PUMP) SOLN, Inject into the skin., Disp: , Rfl:  .  insulin lispro (HUMALOG) 100 UNIT/ML injection, USE AS DIRECTED IN INSULIN PUMP, Disp: , Rfl:  .  levonorgestrel (MIRENA) 20 MCG/24HR IUD, 1 each by Intrauterine route once., Disp: , Rfl:  .  loratadine (CLARITIN) 10 MG tablet, Take 10 mg by mouth  daily., Disp: , Rfl:  .  Multiple Vitamin (MULTI-VITAMIN) tablet, Take 1 tablet by mouth daily., Disp: , Rfl:  .  vitamin C (ASCORBIC ACID) 250 MG tablet, Take 250 mg by mouth 2 (two) times daily., Disp: , Rfl:  .  Vitamin D, Ergocalciferol, 50 MCG (2000 UT) CAPS, Take by mouth. Vitamin D- Take 2 gummies daily., Disp: , Rfl:  .  calcium-vitamin D (OSCAL WITH D) 500-200 MG-UNIT tablet, Take 1 tablet by mouth., Disp: , Rfl:   Past Medical Problems: Past Medical History:  Diagnosis Date  . Diabetes mellitus    diagnosed at age 33  Type 1 per Patient  . Missed ab 07/31/2014   11 wks D & E    Past Surgical History: Past Surgical History:  Procedure Laterality Date  . CESAREAN SECTION N/A 09/05/2015   Procedure: CESAREAN SECTION;  Surgeon: Adam Phenix, MD;  Location: [facility] BIRTHING SUITES;  Service: Obstetrics;  Laterality: N/A;  . CESAREAN SECTION N/A 07/27/2017   Procedure: REPEAT CESAREAN SECTION;  Surgeon: Fairfield Bing, MD;  Location: [facility] BIRTHING SUITES;  Service: Obstetrics;  Laterality: N/A;  . DILATION AND EVACUATION N/A 07/31/2014   Procedure: DILATATION AND EVACUATION;  Surgeon: Catalina Antigua, MD;  Location: [facility];  Service: Gynecology;  Laterality: N/A;  . egg retrieval  August  . WISDOM TOOTH EXTRACTION  2012   x 4  Social History: Social History   Socioeconomic History  . Marital status: Married    Spouse name: Not on file  . Number of children: Not on file  . Years of education: Not on file  . Highest education level: Not on file  Occupational History  . Not on file  Tobacco Use  . Smoking status: Never Smoker  . Smokeless tobacco: Never Used  Vaping Use  . Vaping Use: Never used  Substance and Sexual Activity  . Alcohol use: No  . Drug use: No  . Sexual activity: Yes    Partners: Male    Birth control/protection: I.U.D.    Comment: pregnant  Other Topics Concern  . Not on file  Social History Narrative  . Not on file   Social Determinants of Health    Financial Resource Strain:   . Difficulty of Paying Living Expenses: Not on file  Food Insecurity:   . Worried About Programme researcher, broadcasting/film/video in the Last Year: Not on file  . Ran Out of Food in the Last Year: Not on file  Transportation Needs:   . Lack of Transportation (Medical): Not on file  . Lack of Transportation (Non-Medical): Not on file  Physical Activity:   . Days of Exercise per Week: Not on file  . Minutes of Exercise per Session: Not on file  Stress:   . Feeling of Stress : Not on file  Social Connections:   . Frequency of Communication with Friends and Family: Not on file  . Frequency of Social Gatherings with Friends and Family: Not on file  . Attends Religious Services: Not on file  . Active Member of Clubs or Organizations: Not on file  . Attends Banker Meetings: Not on file  . Marital Status: Not on file  Intimate Partner Violence:   . Fear of Current or Ex-Partner: Not on file  . Emotionally Abused: Not on file  . Physically Abused: Not on file  . Sexually Abused: Not on file    Family History: Family History  Problem Relation Age of Onset  . Gout Father   . Hypertension Father   . Diabetes Maternal Grandmother        type2  . Hypothyroidism Paternal Grandmother   . Thyroid disease Paternal Grandmother   . Diabetes Paternal Grandfather        type 2  . Hypertension Paternal Grandfather     Review of Systems: Review of Systems  Constitutional: Negative.   Respiratory: Negative.   Cardiovascular: Negative.   Gastrointestinal: Negative.     Physical Exam: Vital Signs BP 124/77 (BP Location: Left Arm, Patient Position: Sitting, Cuff Size: Normal)   Pulse 92   Temp 98.9 F (37.2 C) (Oral)   Ht 5\' 8"  (1.727 m)   Wt 152 lb 6.4 oz (69.1 kg)   SpO2 100%   BMI 23.17 kg/m  Physical Exam Exam conducted with a chaperone present.  Constitutional:      General: She is not in acute distress.    Appearance: Normal appearance. She is not  ill-appearing.  HENT:     Head: Normocephalic and atraumatic.  Eyes:     Pupils: Pupils are equal, round Neck:     Musculoskeletal: Normal range of motion.  Cardiovascular:     Rate and Rhythm: Normal rate and regular rhythm.     Pulses: Normal pulses.     Heart sounds: Normal heart sounds. No murmur.  Pulmonary:     Effort: Pulmonary  effort is normal. No respiratory distress.     Breath sounds: Normal breath sounds. No wheezing.  Abdominal:     General: Abdomen is flat. There is no distension.     Palpations: Abdomen is soft.     Tenderness: There is no abdominal tenderness.      Mild diastases recti, less than 1 cm. Musculoskeletal: Normal range of motion.  Skin:    General: Skin is warm and dry.     Findings: No erythema or rash.  Neurological:     General: No focal deficit present.     Mental Status: She is alert and oriented to person, place, and time. Mental status is at baseline.     Motor: No weakness.  Psychiatric:        Mood and Affect: Mood normal.        Behavior: Behavior normal.     Assessment/Plan: The patient is scheduled for mini abdominoplasty with liposuction with Dr. Ulice Bold.  Risks, benefits, and alternatives of procedure discussed, questions answered and consent obtained.    Smoking Status: Non-smoker; Counseling Given?  N/A Last Mammogram: N/A  Caprini Score: 4, moderate; Risk Factors include: T1DM, Mirena IUD, and length of planned surgery. Recommendation for mechanical prophylaxis during surgery. Encourage early ambulation.   Pictures obtained:[email or web address]   Post-op Rx sent to pharmacy: Norco, Zofran, Keflex  Patient was provided with the General Surgical Risk consent document and Pain Medication Agreement prior to their appointment.  They had adequate time to read through the risk consent documents and Pain Medication Agreement. We also discussed them in person together during this preop appointment. All of their questions were answered to their  satisfaction.  Recommended calling if they have any further questions.  Risk consent form and Pain Medication Agreement to be scanned into patient's chart.  We discussed her personal risk factors including type 1 diabetes which could complicate wound healing and increased risk of infection.  We also discussed that her Mirena IUD may slightly increases her risk for a VTE  The risk that can be encountered for this procedure were discussed and include the following but not limited to these: asymmetry, fluid accumulation, firmness of the tissue, skin loss, decrease or no sensation, fat necrosis, bleeding, infection, healing delay.  Deep vein thrombosis, cardiac and pulmonary complications are risks to any procedure.  There are risks of anesthesia, changes to skin sensation and injury to nerves or blood vessels.  The muscle can be temporarily or permanently injured.  You may have an allergic reaction to tape, suture, glue, blood products which can result in skin discoloration, swelling, pain, skin lesions, poor healing.  Any of these can lead to the need for revisonal surgery or stage procedures.  Weight gain and weigh loss can also effect the long term appearance. The results are not guaranteed to last a lifetime.  Future surgery may be required.    The risks that can be encountered with and after liposuction were discussed and include the following but no limited to these:  Asymmetry, fluid accumulation, firmness of the area, fat necrosis with death of fat tissue, bleeding, infection, delayed healing, anesthesia risks, skin sensation changes, injury to structures including nerves, blood vessels, and muscles which may be temporary or permanent, allergies to tape, suture materials and glues, blood products, topical preparations or injected agents, skin and contour irregularities, skin discoloration and swelling, deep vein thrombosis, cardiac and pulmonary complications, pain, which may persist, persistent pain,  recurrence of the lesion, poor healing of the  incision, possible need for revisional surgery or staged procedures. Thiere can also be persistent swelling, poor wound healing, rippling or loose skin, worsening of cellulite, swelling, and thermal burn or heat injury from ultrasound with the ultrasound-assisted lipoplasty technique. Any change in weight fluctuations can alter the outcome.   Electronically signed by: Kermit BaloMatthew J Holly Pring, PA-C 11/25/2019 9:10 AM

## 2019-11-29 DIAGNOSIS — Z719 Counseling, unspecified: Secondary | ICD-10-CM

## 2019-12-16 ENCOUNTER — Other Ambulatory Visit: Payer: Self-pay

## 2019-12-16 ENCOUNTER — Ambulatory Visit (INDEPENDENT_AMBULATORY_CARE_PROVIDER_SITE_OTHER): Payer: Self-pay | Admitting: Plastic Surgery

## 2019-12-16 ENCOUNTER — Encounter: Payer: Self-pay | Admitting: Plastic Surgery

## 2019-12-16 VITALS — BP 120/71 | HR 81 | Temp 97.9°F

## 2019-12-16 DIAGNOSIS — Z719 Counseling, unspecified: Secondary | ICD-10-CM

## 2019-12-16 NOTE — Progress Notes (Signed)
The patient is a 34 year old female here for follow-up after a mini abdominoplasty with liposuction.  Overall she is doing really well.  She has a minimal swelling and bruising.  She looks really good.  The incision is healing nicely.  She had All of her dressings in place and I removed those and the drain today.  She may get a little bit of drainage from the drain site.  She should use Vaseline on that for the next day or 2.  She should go into a spanks or continue with the binder.  I like to see her back in 2 weeks.

## 2019-12-20 ENCOUNTER — Encounter: Payer: Managed Care, Other (non HMO) | Admitting: Plastic Surgery

## 2019-12-23 ENCOUNTER — Other Ambulatory Visit: Payer: Self-pay

## 2019-12-23 ENCOUNTER — Ambulatory Visit (INDEPENDENT_AMBULATORY_CARE_PROVIDER_SITE_OTHER): Payer: Managed Care, Other (non HMO) | Admitting: Surgical

## 2019-12-23 ENCOUNTER — Encounter: Payer: Self-pay | Admitting: Surgical

## 2019-12-23 VITALS — BP 123/83 | HR 91 | Temp 98.7°F

## 2019-12-23 DIAGNOSIS — M793 Panniculitis, unspecified: Secondary | ICD-10-CM

## 2019-12-23 DIAGNOSIS — Z719 Counseling, unspecified: Secondary | ICD-10-CM

## 2019-12-23 NOTE — Progress Notes (Signed)
Patient is a 34 year old female here for follow-up after mini abdominoplasty with liposuction with Dr. Ulice Bold on 12/12/2019.  She is just shy of 2 weeks postop.  Drain was removed at her last visit. Patient reports she is doing well, has returned to work and has no complaints.  She is still been sleeping in a reclined position, this is most comfortable of her.  She has noticed a small blood blister along her left upper thigh where she had a dressing attached.  She is otherwise doing well.  Chaperone present on exam On exam incision is intact, healing nicely.  Bruising has mostly resolved with some yellow discoloration still present.  No dehiscence noted.  No erythema noted.  No swelling noted.  Drain site is healing nicely.  Patient has a follow-up scheduled in 2 weeks with Dr. Ulice Bold for reevaluation. Recommend calling with any questions or concerns.  Continue to avoid strenuous activity. Lifting restriction still in place, discussed this with patient. Continue to wear compressive garment

## 2019-12-27 ENCOUNTER — Encounter: Payer: Managed Care, Other (non HMO) | Admitting: Surgical

## 2020-01-05 DIAGNOSIS — Z719 Counseling, unspecified: Secondary | ICD-10-CM

## 2020-01-06 ENCOUNTER — Encounter: Payer: Managed Care, Other (non HMO) | Admitting: Plastic Surgery

## 2020-01-06 ENCOUNTER — Other Ambulatory Visit: Payer: Self-pay

## 2020-01-06 ENCOUNTER — Ambulatory Visit (INDEPENDENT_AMBULATORY_CARE_PROVIDER_SITE_OTHER): Payer: Managed Care, Other (non HMO) | Admitting: Plastic Surgery

## 2020-01-06 ENCOUNTER — Encounter: Payer: Self-pay | Admitting: Plastic Surgery

## 2020-01-06 VITALS — BP 132/75 | HR 83 | Temp 98.8°F

## 2020-01-06 DIAGNOSIS — Z719 Counseling, unspecified: Secondary | ICD-10-CM

## 2020-01-06 NOTE — Progress Notes (Signed)
The patient is here for follow-up after undergoing a mini abdominoplasty.  Overall she is doing extremely well and looks great.  She is very happy.  I removed to see sutures.  Incision is healing nicely.  There is no sign of hematoma or seroma.  Follow-up as needed.

## 2020-09-18 ENCOUNTER — Encounter: Payer: Self-pay | Admitting: Radiology

## 2020-10-04 ENCOUNTER — Encounter: Payer: Self-pay | Admitting: Adult Health

## 2020-10-29 ENCOUNTER — Ambulatory Visit: Payer: Managed Care, Other (non HMO) | Admitting: Adult Health

## 2020-11-06 ENCOUNTER — Telehealth: Payer: Self-pay | Admitting: Radiology

## 2020-11-06 NOTE — Telephone Encounter (Signed)
Left message for patient to call [facility] to see about rescheduling annual exam for later date. - Does not need PAP until 2024

## 2020-12-03 ENCOUNTER — Ambulatory Visit: Payer: Managed Care, Other (non HMO) | Admitting: Obstetrics & Gynecology

## 2020-12-24 ENCOUNTER — Encounter: Payer: Self-pay | Admitting: Obstetrics & Gynecology

## 2020-12-24 ENCOUNTER — Ambulatory Visit (INDEPENDENT_AMBULATORY_CARE_PROVIDER_SITE_OTHER): Payer: Managed Care, Other (non HMO) | Admitting: Obstetrics & Gynecology

## 2020-12-24 ENCOUNTER — Other Ambulatory Visit: Payer: Self-pay

## 2020-12-24 VITALS — BP 116/74 | HR 85 | Ht 68.0 in | Wt 158.0 lb

## 2020-12-24 DIAGNOSIS — Z01419 Encounter for gynecological examination (general) (routine) without abnormal findings: Secondary | ICD-10-CM

## 2020-12-24 NOTE — Progress Notes (Signed)
GYNECOLOGY ANNUAL PREVENTATIVE CARE ENCOUNTER NOTE  History:     Karen Alexander is a 35 y.o. [phone] female here for a routine annual gynecologic exam.  Current complaints: none.   Denies abnormal vaginal bleeding, discharge, pelvic pain, problems with intercourse or other gynecologic concerns.    Gynecologic History No LMP recorded. (Menstrual status: IUD). Contraception: Mirena IUD in place since 2019, no issues Last Pap: 10/31/2019. Result was normal with negative HPV  Obstetric History OB History  Gravida Para Term Preterm AB Living  4 3 2 1 1 3   SAB IAB Ectopic Multiple Live Births  1 0 0 0 3    # Outcome Date GA Lbr Len/2nd Weight Sex Delivery Anes PTL Lv  4 Term 07/27/17 [redacted]w[redacted]d  10 lb 9 oz (4.79 kg) M CS-LTranv Spinal  LIV  3 Term 09/05/15 [redacted]w[redacted]d  13 lb 8.8 oz (6.145 kg) M CS-LTranv Spinal  LIV  2 Preterm 05/27/13 [redacted]w[redacted]d 02:39 / 00:50 9 lb 1.3 oz (4.12 kg) M Vag-Spont EPI  LIV  1 SAB             Past Medical History:  Diagnosis Date   Diabetes mellitus    diagnosed at age 40  Type 1 per Patient   Missed ab 07/31/2014   11 wks D & E    Past Surgical History:  Procedure Laterality Date   ABDOMINOPLASTY     11/2019   CESAREAN SECTION N/A 09/05/2015   Procedure: CESAREAN SECTION;  Surgeon: 09/07/2015, MD;  Location: [facility] BIRTHING SUITES;  Service: Obstetrics;  Laterality: N/A;   CESAREAN SECTION N/A 07/27/2017   Procedure: REPEAT CESAREAN SECTION;  Surgeon: 09/26/2017, MD;  Location: [facility] BIRTHING SUITES;  Service: Obstetrics;  Laterality: N/A;   DILATION AND EVACUATION N/A 07/31/2014   Procedure: DILATATION AND EVACUATION;  Surgeon: 08/02/2014, MD;  Location: [facility] ORS;  Service: Gynecology;  Laterality: N/A;   egg retrieval  August   WISDOM TOOTH EXTRACTION  02/17/2010   x 4    Current Outpatient Medications on File Prior to Visit  Medication Sig Dispense Refill   Continuous Blood Gluc Sensor (DEXCOM G6 SENSOR) MISC Use to monitor blood sugar.  Replace  every 10 days. NDC [phone].     insulin lispro (HUMALOG) 100 UNIT/ML injection USE AS DIRECTED IN INSULIN PUMP     levonorgestrel (MIRENA) 20 MCG/24HR IUD 1 each by Intrauterine route once.     loratadine (CLARITIN) 10 MG tablet Take 10 mg by mouth daily.     Aspirin 81 MG CAPS  (Patient not taking: Reported on 12/24/2020)     B-D ULTRA-FINE 33 LANCETS MISC      calcium-vitamin D (OSCAL WITH D) 500-200 MG-UNIT tablet Take 1 tablet by mouth.     glucose blood (BAYER CONTOUR NEXT TEST) test strip Reported on 07/31/2015     Insulin Human (INSULIN PUMP) SOLN Inject into the skin.     Multiple Vitamin (MULTI-VITAMIN) tablet Take 1 tablet by mouth daily. (Patient not taking: Reported on 12/24/2020)     ondansetron (ZOFRAN) 4 MG tablet Take 1 tablet (4 mg total) by mouth every 8 (eight) hours as needed for nausea or vomiting. 20 tablet 0   vitamin C (ASCORBIC ACID) 250 MG tablet Take 250 mg by mouth 2 (two) times daily. (Patient not taking: Reported on 12/24/2020)     Vitamin D, Ergocalciferol, 50 MCG (2000 UT) CAPS Take by mouth. Vitamin D- Take 2 gummies daily. (Patient not taking:  Reported on 12/24/2020)     No current facility-administered medications on file prior to visit.    No Known Allergies  Social History:  reports that she has never smoked. She has never used smokeless tobacco. She reports that she does not drink alcohol and does not use drugs.  Family History  Problem Relation Age of Onset   Gout Father    Hypertension Father    Diabetes Maternal Grandmother        type2   Hypothyroidism Paternal Grandmother    Thyroid disease Paternal Grandmother    Diabetes Paternal Grandfather        type 2   Hypertension Paternal Grandfather     The following portions of the patient's history were reviewed and updated as appropriate: allergies, current medications, past family history, past medical history, past social history, past surgical history and problem list.  Review of  Systems Pertinent items noted in HPI and remainder of comprehensive ROS otherwise negative.  Physical Exam:  BP 116/74   Pulse 85   Ht 5\' 8"  (1.727 m)   Wt 158 lb (71.7 kg)   BMI 24.02 kg/m  CONSTITUTIONAL: Well-developed, well-nourished female in no acute distress.  HENT:  Normocephalic, atraumatic, External right and left ear normal.  EYES: Conjunctivae and EOM are normal. Pupils are equal, round, and reactive to light. No scleral icterus.  NECK: Normal range of motion, supple, no masses.  Normal thyroid.  SKIN: Skin is warm and dry. No rash noted. Not diaphoretic. No erythema. No pallor. MUSCULOSKELETAL: Normal range of motion. No tenderness.  No cyanosis, clubbing, or edema. NEUROLOGIC: Alert and oriented to person, place, and time. Normal reflexes, muscle tone coordination.  PSYCHIATRIC: Normal mood and affect. Normal behavior. Normal judgment and thought content. CARDIOVASCULAR: Normal heart rate noted, regular rhythm RESPIRATORY: Clear to auscultation bilaterally. Effort and breath sounds normal, no problems with respiration noted. BREASTS: Symmetric in size. No masses, tenderness, skin changes, nipple drainage, or lymphadenopathy bilaterally. Performed in the presence of a chaperone. ABDOMEN: Soft, no distention noted.  No tenderness, rebound or guarding.  PELVIC: Deferred.   Assessment and Plan:     Well woman exam Normal breast examination, pelvic examination deferred. Pap is up to date. Routine preventative health maintenance measures emphasized. Please refer to After Visit Summary for other counseling recommendations.      , MD, FACOG Obstetrician & Gynecologist, [facility] for [facility]

## 2020-12-24 NOTE — Patient Instructions (Signed)

## 2022-01-13 ENCOUNTER — Ambulatory Visit (INDEPENDENT_AMBULATORY_CARE_PROVIDER_SITE_OTHER): Payer: Managed Care, Other (non HMO) | Admitting: Obstetrics & Gynecology

## 2022-01-13 ENCOUNTER — Other Ambulatory Visit (HOSPITAL_COMMUNITY)
Admission: RE | Admit: 2022-01-13 | Discharge: 2022-01-13 | Disposition: A | Discharge status: Home / Self Care | Payer: Managed Care, Other (non HMO) | Source: Ambulatory Visit | Attending: Obstetrics & Gynecology | Admitting: Obstetrics & Gynecology

## 2022-01-13 ENCOUNTER — Encounter: Payer: Self-pay | Admitting: Obstetrics & Gynecology

## 2022-01-13 VITALS — BP 124/78 | HR 89 | Wt 161.0 lb

## 2022-01-13 DIAGNOSIS — Z975 Presence of (intrauterine) contraceptive device: Secondary | ICD-10-CM | POA: Diagnosis not present

## 2022-01-13 DIAGNOSIS — Z01419 Encounter for gynecological examination (general) (routine) without abnormal findings: Secondary | ICD-10-CM | POA: Insufficient documentation

## 2022-01-13 NOTE — Progress Notes (Signed)
GYNECOLOGY ANNUAL PREVENTATIVE CARE ENCOUNTER NOTE  History:     Karen Alexander is a 36 y.o. [phone] female here for a routine annual gynecologic exam.  Current complaints: none.   Denies abnormal vaginal bleeding, discharge, pelvic pain, problems with intercourse or other gynecologic concerns.    Gynecologic History No LMP recorded. (Menstrual status: IUD). Contraception: Mirena IUD in place since 2019 Last Pap: 9/31/2021. Result was normal with negative HPV  Obstetric History OB History  Gravida Para Term Preterm AB Living  4 3 2 1 1 3   SAB IAB Ectopic Multiple Live Births  1 0 0 0 3    # Outcome Date GA Lbr Len/2nd Weight Sex Delivery Anes PTL Lv  4 Term 07/27/17 [redacted]w[redacted]d  10 lb 9 oz (4.79 kg) M CS-LTranv Spinal  LIV  3 Term 09/05/15 [redacted]w[redacted]d  13 lb 8.8 oz (6.145 kg) M CS-LTranv Spinal  LIV  2 Preterm 05/27/13 [redacted]w[redacted]d 02:39 / 00:50 9 lb 1.3 oz (4.12 kg) M Vag-Spont EPI  LIV  1 SAB             Past Medical History:  Diagnosis Date   Diabetes mellitus    diagnosed at age 60  Type 1 per Patient   Missed ab 07/31/2014   11 wks D & E    Past Surgical History:  Procedure Laterality Date   ABDOMINOPLASTY     11/2019   CESAREAN SECTION N/A 09/05/2015   Procedure: CESAREAN SECTION;  Surgeon: 09/07/2015, MD;  Location: [facility] BIRTHING SUITES;  Service: Obstetrics;  Laterality: N/A;   CESAREAN SECTION N/A 07/27/2017   Procedure: REPEAT CESAREAN SECTION;  Surgeon: 09/26/2017, MD;  Location: [facility] BIRTHING SUITES;  Service: Obstetrics;  Laterality: N/A;   DILATION AND EVACUATION N/A 07/31/2014   Procedure: DILATATION AND EVACUATION;  Surgeon: 08/02/2014, MD;  Location: [facility];  Service: Gynecology;  Laterality: N/A;   egg retrieval  August   WISDOM TOOTH EXTRACTION  02/17/2010   x 4    Current Outpatient Medications on File Prior to Visit  Medication Sig Dispense Refill   Continuous Blood Gluc Sensor (DEXCOM G6 SENSOR) MISC Use to monitor blood sugar.  Replace every 10  days. NDC [phone].     Insulin Human (INSULIN PUMP) SOLN Inject into the skin.     insulin lispro (HUMALOG) 100 UNIT/ML injection USE AS DIRECTED IN INSULIN PUMP     levonorgestrel (MIRENA) 20 MCG/24HR IUD 1 each by Intrauterine route once.     loratadine (CLARITIN) 10 MG tablet Take 10 mg by mouth daily.     No current facility-administered medications on file prior to visit.    No Known Allergies  Social History:  reports that she has never smoked. She has never used smokeless tobacco. She reports that she does not drink alcohol and does not use drugs.  Family History  Problem Relation Age of Onset   Gout Father    Hypertension Father    Diabetes Maternal Grandmother        type2   Hypothyroidism Paternal Grandmother    Thyroid disease Paternal Grandmother    Diabetes Paternal Grandfather        type 2   Hypertension Paternal Grandfather     The following portions of the patient's history were reviewed and updated as appropriate: allergies, current medications, past family history, past medical history, past social history, past surgical history and problem list.  Review of Systems Pertinent items noted in HPI and  remainder of comprehensive ROS otherwise negative.  Physical Exam:  BP 124/78   Pulse 89   Wt 161 lb (73 kg)   BMI 24.48 kg/m  CONSTITUTIONAL: Well-developed, well-nourished female in no acute distress.  HENT:  Normocephalic, atraumatic, External right and left ear normal.  EYES: Conjunctivae and EOM are normal. Pupils are equal, round, and reactive to light. No scleral icterus.  NECK: Normal range of motion, supple, no masses.  Normal thyroid.  SKIN: Skin is warm and dry. No rash noted. Not diaphoretic. No erythema. No pallor. MUSCULOSKELETAL: Normal range of motion. No tenderness.  No cyanosis, clubbing, or edema. NEUROLOGIC: Alert and oriented to person, place, and time. Normal reflexes, muscle tone coordination.  PSYCHIATRIC: Normal mood and affect.  Normal behavior. Normal judgment and thought content. CARDIOVASCULAR: Normal heart rate noted, regular rhythm RESPIRATORY: Clear to auscultation bilaterally. Effort and breath sounds normal, no problems with respiration noted. BREASTS: Symmetric in size. No masses, tenderness, skin changes, nipple drainage, or lymphadenopathy bilaterally. Performed in the presence of a chaperone. ABDOMEN: Soft, no distention noted.  No tenderness, rebound or guarding.  PELVIC: Normal appearing external genitalia and urethral meatus; normal appearing vaginal mucosa and cervix.  Mirena IUD strings visualized about 2 cm in length outside cervix. No abnormal vaginal discharge noted.  Pap smear obtained.  Normal uterine size, no other palpable masses, no uterine or adnexal tenderness.  Performed in the presence of a chaperone.   Assessment and Plan:     1. Mirena IUD (intrauterine device) in place since 2019 Satisfied with method, no concerns. Can be in place for up to eight years.   2. Well woman exam with routine gynecological exam - Cytology - PAP Will follow up results of pap smear and manage accordingly. Routine preventative health maintenance measures emphasized. Please refer to After Visit Summary for other counseling recommendations.      Jaynie Collins, MD, FACOG Obstetrician & Gynecologist, [facility] for [employer], [facility]

## 2022-01-15 LAB — CYTOLOGY - PAP
Comment: NEGATIVE
Diagnosis: UNDETERMINED — AB
High risk HPV: NEGATIVE

## 2022-04-28 DIAGNOSIS — Z9641 Presence of insulin pump (external) (internal): Secondary | ICD-10-CM | POA: Diagnosis not present

## 2022-04-28 DIAGNOSIS — E109 Type 1 diabetes mellitus without complications: Secondary | ICD-10-CM | POA: Diagnosis not present

## 2022-08-11 DIAGNOSIS — E109 Type 1 diabetes mellitus without complications: Secondary | ICD-10-CM | POA: Diagnosis not present

## 2022-08-14 DIAGNOSIS — Z9641 Presence of insulin pump (external) (internal): Secondary | ICD-10-CM | POA: Diagnosis not present

## 2022-08-14 DIAGNOSIS — E109 Type 1 diabetes mellitus without complications: Secondary | ICD-10-CM | POA: Diagnosis not present

## 2022-11-20 DIAGNOSIS — L578 Other skin changes due to chronic exposure to nonionizing radiation: Secondary | ICD-10-CM | POA: Diagnosis not present

## 2022-11-20 DIAGNOSIS — L821 Other seborrheic keratosis: Secondary | ICD-10-CM | POA: Diagnosis not present

## 2022-11-20 DIAGNOSIS — L2089 Other atopic dermatitis: Secondary | ICD-10-CM | POA: Diagnosis not present

## 2022-11-25 DIAGNOSIS — Z9641 Presence of insulin pump (external) (internal): Secondary | ICD-10-CM | POA: Diagnosis not present

## 2022-11-25 DIAGNOSIS — E109 Type 1 diabetes mellitus without complications: Secondary | ICD-10-CM | POA: Diagnosis not present

## 2023-02-03 ENCOUNTER — Other Ambulatory Visit (HOSPITAL_COMMUNITY)
Admission: RE | Admit: 2023-02-03 | Discharge: 2023-02-03 | Disposition: A | Discharge status: Home / Self Care | Payer: 59 | Source: Ambulatory Visit | Attending: Obstetrics & Gynecology | Admitting: Obstetrics & Gynecology

## 2023-02-03 ENCOUNTER — Ambulatory Visit (INDEPENDENT_AMBULATORY_CARE_PROVIDER_SITE_OTHER): Payer: 59 | Admitting: Obstetrics & Gynecology

## 2023-02-03 ENCOUNTER — Encounter: Payer: Self-pay | Admitting: Obstetrics & Gynecology

## 2023-02-03 VITALS — BP 127/82 | HR 71 | Ht 68.0 in | Wt 166.0 lb

## 2023-02-03 DIAGNOSIS — R87610 Atypical squamous cells of undetermined significance on cytologic smear of cervix (ASC-US): Secondary | ICD-10-CM

## 2023-02-03 DIAGNOSIS — Z01419 Encounter for gynecological examination (general) (routine) without abnormal findings: Secondary | ICD-10-CM | POA: Diagnosis not present

## 2023-02-03 DIAGNOSIS — Z975 Presence of (intrauterine) contraceptive device: Secondary | ICD-10-CM

## 2023-02-03 NOTE — Progress Notes (Signed)
GYNECOLOGY ANNUAL PREVENTATIVE CARE ENCOUNTER NOTE  History:     Karen Alexander is a 37 y.o. [phone] female here for a routine annual gynecologic exam.  Current complaints: none, desires repeat pap smear this year.   Denies abnormal vaginal bleeding, discharge, pelvic pain, problems with intercourse or other gynecologic concerns.    Gynecologic History No LMP recorded. (Menstrual status: IUD). Contraception: Mirena IUD placed 2019 Last Pap: ASCUS of cervix with negative high risk HPV on 01/13/22   Obstetric History OB History  Gravida Para Term Preterm AB Living  4 3 2 1 1 3   SAB IAB Ectopic Multiple Live Births  1 0 0 0 3    # Outcome Date GA Lbr Len/2nd Weight Sex Type Anes PTL Lv  4 Term 07/27/17 [redacted]w[redacted]d  10 lb 9 oz (4.79 kg) M CS-LTranv Spinal  LIV  3 Term 09/05/15 [redacted]w[redacted]d  13 lb 8.8 oz (6.145 kg) M CS-LTranv Spinal  LIV  2 Preterm 05/27/13 [redacted]w[redacted]d 02:39 / 00:50 9 lb 1.3 oz (4.12 kg) M Vag-Spont EPI  LIV  1 SAB             Past Medical History:  Diagnosis Date   Diabetes mellitus    diagnosed at age 68  Type 1 per Patient   Missed ab 07/31/2014   11 wks D & E    Past Surgical History:  Procedure Laterality Date   ABDOMINOPLASTY     11/2019   CESAREAN SECTION N/A 09/05/2015   Procedure: CESAREAN SECTION;  Surgeon: Adam Phenix, MD;  Location: [facility] BIRTHING SUITES;  Service: Obstetrics;  Laterality: N/A;   CESAREAN SECTION N/A 07/27/2017   Procedure: REPEAT CESAREAN SECTION;  Surgeon: Duchesne Bing, MD;  Location: [facility]IRTHING SUITES;  Service: Obstetrics;  Laterality: N/A;   DILATION AND EVACUATION N/A 07/31/2014   Procedure: DILATATION AND EVACUATION;  Surgeon: Catalina Antigua, MD;  Location: [facility] ORS;  Service: Gynecology;  Laterality: N/A;   egg retrieval  August   WISDOM TOOTH EXTRACTION  02/17/2010   x 4    Current Outpatient Medications on File Prior to Visit  Medication Sig Dispense Refill   Continuous Blood Gluc Sensor (DEXCOM G6 SENSOR) MISC Use to monitor  blood sugar.  Replace every 10 days. NDC [phone].     Continuous Glucose Transmitter (DEXCOM G6 TRANSMITTER) MISC USE AS DIRECTED, REPLACE EVERY 3 MONTHS     fluocinonide ointment (LIDEX) 0.05 % Apply 1 Application topically 2 (two) times daily.     levonorgestrel (MIRENA) 20 MCG/24HR IUD 1 each by Intrauterine route once.     loratadine (CLARITIN) 10 MG tablet Take 10 mg by mouth daily.     NOVOLOG 100 UNIT/ML injection Inject into the skin as directed.     Insulin Human (INSULIN PUMP) SOLN Inject into the skin. (Patient not taking: Reported on 02/03/2023)     insulin lispro (HUMALOG) 100 UNIT/ML injection USE AS DIRECTED IN INSULIN PUMP (Patient not taking: Reported on 02/03/2023)     No current facility-administered medications on file prior to visit.    No Known Allergies  Social History:  reports that she has never smoked. She has never used smokeless tobacco. She reports that she does not drink alcohol and does not use drugs.  Family History  Problem Relation Age of Onset   Gout Father    Hypertension Father    Diabetes Maternal Grandmother        type2   Hypothyroidism Paternal Grandmother  Thyroid disease Paternal Grandmother    Diabetes Paternal Grandfather        type 2   Hypertension Paternal Grandfather     The following portions of the patient's history were reviewed and updated as appropriate: allergies, current medications, past family history, past medical history, past social history, past surgical history and problem list.  Review of Systems Pertinent items noted in HPI and remainder of comprehensive ROS otherwise negative.  Physical Exam:  BP 127/82   Pulse 71   Ht 5\' 8"  (1.727 m)   Wt 166 lb (75.3 kg)   BMI 25.24 kg/m  CONSTITUTIONAL: Well-developed, well-nourished female in no acute distress.  HENT:  Normocephalic, atraumatic, External right and left ear normal.  EYES: Conjunctivae and EOM are normal. Pupils are equal, round, and reactive to  light. No scleral icterus.  NECK: Normal range of motion, supple, no masses.  Normal thyroid.  SKIN: Skin is warm and dry. No rash noted. Not diaphoretic. No erythema. No pallor. MUSCULOSKELETAL: Normal range of motion. No tenderness.  No cyanosis, clubbing, or edema. NEUROLOGIC: Alert and oriented to person, place, and time. Normal reflexes, muscle tone coordination.  PSYCHIATRIC: Normal mood and affect. Normal behavior. Normal judgment and thought content. CARDIOVASCULAR: Normal heart rate noted, regular rhythm RESPIRATORY: Clear to auscultation bilaterally. Effort and breath sounds normal, no problems with respiration noted. BREASTS: Symmetric in size. No masses, tenderness, skin changes, nipple drainage, or lymphadenopathy bilaterally. Performed in the presence of a chaperone. ABDOMEN: Soft, no distention noted.  No tenderness, rebound or guarding.  PELVIC: Normal appearing external genitalia and urethral meatus; normal appearing vaginal mucosa and cervix.  Mirena IUD strings seen, about 3 cm in length outside cervix. No abnormal vaginal discharge noted.  Pap smear obtained.  Normal uterine size, no other palpable masses, no uterine or adnexal tenderness.  Performed in the presence of a chaperone.   Assessment and Plan:    1. Mirena IUD (intrauterine device) in place since 2019 Doing well, no concerns. Can be in place until 2027.  2. ASCUS of cervix with negative high risk HPV on 01/13/22 3. Well woman exam with routine gynecological exam (Primary) Repeat pap done.  Will follow up results of pap smear and manage accordingly. - Cytology - PAP Normal breast examination today, she was advised to perform periodic self breast examinations.  Mammogram scheduled for breast cancer screening. Follow up with PCP, Endocrinologist for other health issues. Routine preventative health maintenance measures emphasized. Please refer to After Visit Summary for other counseling recommendations.       Jaynie Collins, MD, FACOG Obstetrician & Gynecologist, [facility] for [employer], [facility]

## 2023-02-05 LAB — CYTOLOGY - PAP
Adequacy: ABSENT
Comment: NEGATIVE
Diagnosis: UNDETERMINED — AB
High risk HPV: NEGATIVE

## 2023-02-06 ENCOUNTER — Encounter: Payer: Self-pay | Admitting: Obstetrics & Gynecology

## 2023-02-06 DIAGNOSIS — R87610 Atypical squamous cells of undetermined significance on cytologic smear of cervix (ASC-US): Secondary | ICD-10-CM | POA: Insufficient documentation

## 2023-03-13 DIAGNOSIS — E109 Type 1 diabetes mellitus without complications: Secondary | ICD-10-CM | POA: Diagnosis not present

## 2023-03-19 DIAGNOSIS — E109 Type 1 diabetes mellitus without complications: Secondary | ICD-10-CM | POA: Diagnosis not present

## 2023-03-19 DIAGNOSIS — Z9641 Presence of insulin pump (external) (internal): Secondary | ICD-10-CM | POA: Diagnosis not present

## 2023-06-18 DIAGNOSIS — Z9641 Presence of insulin pump (external) (internal): Secondary | ICD-10-CM | POA: Diagnosis not present

## 2023-06-18 DIAGNOSIS — E109 Type 1 diabetes mellitus without complications: Secondary | ICD-10-CM | POA: Diagnosis not present

## 2023-09-24 DIAGNOSIS — Z9641 Presence of insulin pump (external) (internal): Secondary | ICD-10-CM | POA: Diagnosis not present

## 2023-09-24 DIAGNOSIS — E109 Type 1 diabetes mellitus without complications: Secondary | ICD-10-CM | POA: Diagnosis not present

## 2023-11-11 NOTE — Progress Notes (Signed)
 Karen Alexander                                          MRN: [id number]   11/11/2023   The [facility] Quality Team Specialist reviewed this patient medical record for the purposes of chart review for care gap closure. The following were reviewed: chart review for care gap closure-glycemic status assessment.    [facility] Quality Team

## 2023-11-11 NOTE — Progress Notes (Signed)
 MATEYA TORTI                                          MRN: [id number]   11/11/2023   The [facility] Quality Team Specialist reviewed this patient medical record for the purposes of chart review for care gap closure. The following were reviewed: chart review for care gap closure-diabetic eye exam.    [facility] Quality Team

## 2023-12-01 DIAGNOSIS — D1801 Hemangioma of skin and subcutaneous tissue: Secondary | ICD-10-CM | POA: Diagnosis not present

## 2023-12-01 DIAGNOSIS — L309 Dermatitis, unspecified: Secondary | ICD-10-CM | POA: Diagnosis not present

## 2023-12-21 DIAGNOSIS — E109 Type 1 diabetes mellitus without complications: Secondary | ICD-10-CM | POA: Diagnosis not present

## 2023-12-28 DIAGNOSIS — Z9641 Presence of insulin pump (external) (internal): Secondary | ICD-10-CM | POA: Diagnosis not present

## 2023-12-28 DIAGNOSIS — E109 Type 1 diabetes mellitus without complications: Secondary | ICD-10-CM | POA: Diagnosis not present

## 2023-12-28 DIAGNOSIS — Z1331 Encounter for screening for depression: Secondary | ICD-10-CM | POA: Diagnosis not present

## 2024-03-21 NOTE — Progress Notes (Signed)
 JOLYN DESHMUKH                                          MRN: [id number]   03/21/2024   The [facility] Quality Team Specialist reviewed this patient medical record for the purposes of chart review for care gap closure. The following were reviewed: chart review for care gap closure-diabetic eye exam.    [facility] Quality Team
